# Patient Record
Sex: Female | Born: 1960 | Race: White | Hispanic: No | Smoking: Never smoker
Health system: Southern US, Community
[De-identification: ages and names within clinical notes are randomized; demographics above are authoritative.]

## PROBLEM LIST (undated history)

## (undated) ENCOUNTER — Emergency Department: Payer: Medicare Other

## (undated) DIAGNOSIS — F419 Anxiety disorder, unspecified: Secondary | ICD-10-CM

## (undated) DIAGNOSIS — J42 Unspecified chronic bronchitis: Secondary | ICD-10-CM

## (undated) DIAGNOSIS — M1990 Unspecified osteoarthritis, unspecified site: Secondary | ICD-10-CM

## (undated) DIAGNOSIS — F32A Depression, unspecified: Secondary | ICD-10-CM

## (undated) DIAGNOSIS — K219 Gastro-esophageal reflux disease without esophagitis: Secondary | ICD-10-CM

## (undated) DIAGNOSIS — G43909 Migraine, unspecified, not intractable, without status migrainosus: Secondary | ICD-10-CM

## (undated) DIAGNOSIS — R011 Cardiac murmur, unspecified: Secondary | ICD-10-CM

## (undated) DIAGNOSIS — I10 Essential (primary) hypertension: Secondary | ICD-10-CM

## (undated) DIAGNOSIS — E7800 Pure hypercholesterolemia, unspecified: Secondary | ICD-10-CM

## (undated) DIAGNOSIS — R079 Chest pain, unspecified: Secondary | ICD-10-CM

## (undated) DIAGNOSIS — I639 Cerebral infarction, unspecified: Secondary | ICD-10-CM

## (undated) DIAGNOSIS — F4310 Post-traumatic stress disorder, unspecified: Secondary | ICD-10-CM

## (undated) DIAGNOSIS — I219 Acute myocardial infarction, unspecified: Secondary | ICD-10-CM

## (undated) DIAGNOSIS — F329 Major depressive disorder, single episode, unspecified: Secondary | ICD-10-CM

## (undated) DIAGNOSIS — Q210 Ventricular septal defect: Secondary | ICD-10-CM

## (undated) DIAGNOSIS — D509 Iron deficiency anemia, unspecified: Secondary | ICD-10-CM

## (undated) HISTORY — PX: VSD REPAIR: SHX276

## (undated) HISTORY — PX: CHOLECYSTECTOMY: SHX55

---

## 1997-12-14 ENCOUNTER — Ambulatory Visit (HOSPITAL_COMMUNITY): Admission: RE | Admit: 1997-12-14 | Discharge: 1997-12-14 | Payer: Self-pay | Admitting: Internal Medicine

## 1998-01-03 ENCOUNTER — Other Ambulatory Visit: Admission: RE | Admit: 1998-01-03 | Discharge: 1998-01-03 | Payer: Self-pay | Admitting: Obstetrics and Gynecology

## 1999-06-04 ENCOUNTER — Other Ambulatory Visit: Admission: RE | Admit: 1999-06-04 | Discharge: 1999-06-04 | Payer: Self-pay | Admitting: Gynecology

## 2000-06-12 ENCOUNTER — Other Ambulatory Visit: Admission: RE | Admit: 2000-06-12 | Discharge: 2000-06-12 | Payer: Self-pay | Admitting: Gynecology

## 2000-11-17 ENCOUNTER — Emergency Department (HOSPITAL_COMMUNITY): Admission: EM | Admit: 2000-11-17 | Discharge: 2000-11-18 | Payer: Self-pay | Admitting: Emergency Medicine

## 2000-11-18 ENCOUNTER — Encounter: Payer: Self-pay | Admitting: Emergency Medicine

## 2000-11-24 ENCOUNTER — Ambulatory Visit (HOSPITAL_COMMUNITY): Admission: RE | Admit: 2000-11-24 | Discharge: 2000-11-25 | Payer: Self-pay | Admitting: General Surgery

## 2000-11-24 ENCOUNTER — Encounter: Payer: Self-pay | Admitting: General Surgery

## 2000-11-24 ENCOUNTER — Encounter (INDEPENDENT_AMBULATORY_CARE_PROVIDER_SITE_OTHER): Payer: Self-pay | Admitting: *Deleted

## 2000-11-29 ENCOUNTER — Inpatient Hospital Stay (HOSPITAL_COMMUNITY): Admission: EM | Admit: 2000-11-29 | Discharge: 2000-12-01 | Payer: Self-pay | Admitting: Emergency Medicine

## 2000-11-30 ENCOUNTER — Encounter: Payer: Self-pay | Admitting: Surgery

## 2002-11-10 ENCOUNTER — Emergency Department (HOSPITAL_COMMUNITY): Admission: AD | Admit: 2002-11-10 | Discharge: 2002-11-11 | Payer: Self-pay | Admitting: Emergency Medicine

## 2002-11-11 ENCOUNTER — Encounter: Payer: Self-pay | Admitting: Emergency Medicine

## 2002-12-29 ENCOUNTER — Ambulatory Visit (HOSPITAL_COMMUNITY): Admission: RE | Admit: 2002-12-29 | Discharge: 2002-12-29 | Payer: Self-pay | Admitting: Neurology

## 2003-04-04 ENCOUNTER — Encounter: Admission: RE | Admit: 2003-04-04 | Discharge: 2003-04-04 | Payer: Self-pay | Admitting: Neurology

## 2003-07-15 ENCOUNTER — Other Ambulatory Visit: Admission: RE | Admit: 2003-07-15 | Discharge: 2003-07-15 | Payer: Self-pay | Admitting: Family Medicine

## 2004-11-16 ENCOUNTER — Ambulatory Visit: Payer: Self-pay | Admitting: Family Medicine

## 2004-11-22 ENCOUNTER — Ambulatory Visit: Payer: Self-pay | Admitting: Family Medicine

## 2004-11-27 ENCOUNTER — Ambulatory Visit (HOSPITAL_COMMUNITY): Admission: RE | Admit: 2004-11-27 | Discharge: 2004-11-27 | Payer: Self-pay | Admitting: Neurology

## 2004-12-10 ENCOUNTER — Ambulatory Visit: Payer: Self-pay | Admitting: *Deleted

## 2005-01-03 ENCOUNTER — Ambulatory Visit: Payer: Self-pay | Admitting: Family Medicine

## 2005-01-25 ENCOUNTER — Ambulatory Visit: Payer: Self-pay | Admitting: Family Medicine

## 2005-02-18 ENCOUNTER — Ambulatory Visit: Payer: Self-pay | Admitting: Family Medicine

## 2005-02-18 ENCOUNTER — Ambulatory Visit (HOSPITAL_COMMUNITY): Admission: RE | Admit: 2005-02-18 | Discharge: 2005-02-18 | Payer: Self-pay | Admitting: Family Medicine

## 2005-02-22 ENCOUNTER — Ambulatory Visit: Payer: Self-pay | Admitting: Family Medicine

## 2005-02-27 ENCOUNTER — Ambulatory Visit: Payer: Self-pay | Admitting: Family Medicine

## 2005-03-20 ENCOUNTER — Ambulatory Visit: Payer: Self-pay | Admitting: Family Medicine

## 2005-05-13 ENCOUNTER — Ambulatory Visit: Payer: Self-pay | Admitting: Family Medicine

## 2005-07-12 ENCOUNTER — Ambulatory Visit: Payer: Self-pay | Admitting: Family Medicine

## 2005-08-30 ENCOUNTER — Encounter: Payer: Self-pay | Admitting: Family Medicine

## 2005-08-30 ENCOUNTER — Ambulatory Visit: Payer: Self-pay | Admitting: Family Medicine

## 2005-08-30 ENCOUNTER — Encounter (INDEPENDENT_AMBULATORY_CARE_PROVIDER_SITE_OTHER): Payer: Self-pay | Admitting: Family Medicine

## 2005-08-30 LAB — CONVERTED CEMR LAB: Pap Smear: NORMAL

## 2005-09-16 ENCOUNTER — Ambulatory Visit: Payer: Self-pay | Admitting: Internal Medicine

## 2006-03-05 ENCOUNTER — Ambulatory Visit: Payer: Self-pay | Admitting: Family Medicine

## 2006-03-12 ENCOUNTER — Emergency Department (HOSPITAL_COMMUNITY): Admission: EM | Admit: 2006-03-12 | Discharge: 2006-03-12 | Payer: Self-pay | Admitting: Emergency Medicine

## 2006-03-13 ENCOUNTER — Ambulatory Visit: Payer: Self-pay | Admitting: Family Medicine

## 2006-05-20 ENCOUNTER — Ambulatory Visit: Payer: Self-pay | Admitting: Family Medicine

## 2006-09-06 ENCOUNTER — Encounter (INDEPENDENT_AMBULATORY_CARE_PROVIDER_SITE_OTHER): Payer: Self-pay | Admitting: Family Medicine

## 2006-09-06 DIAGNOSIS — J309 Allergic rhinitis, unspecified: Secondary | ICD-10-CM | POA: Insufficient documentation

## 2006-09-06 DIAGNOSIS — Q210 Ventricular septal defect: Secondary | ICD-10-CM | POA: Insufficient documentation

## 2006-09-06 DIAGNOSIS — R32 Unspecified urinary incontinence: Secondary | ICD-10-CM | POA: Insufficient documentation

## 2006-09-06 DIAGNOSIS — R51 Headache: Secondary | ICD-10-CM | POA: Insufficient documentation

## 2006-09-06 DIAGNOSIS — F329 Major depressive disorder, single episode, unspecified: Secondary | ICD-10-CM

## 2006-09-06 DIAGNOSIS — F411 Generalized anxiety disorder: Secondary | ICD-10-CM | POA: Insufficient documentation

## 2006-09-06 DIAGNOSIS — Z8669 Personal history of other diseases of the nervous system and sense organs: Secondary | ICD-10-CM | POA: Insufficient documentation

## 2006-09-06 DIAGNOSIS — F32A Depression, unspecified: Secondary | ICD-10-CM | POA: Insufficient documentation

## 2006-09-06 DIAGNOSIS — R519 Headache, unspecified: Secondary | ICD-10-CM | POA: Insufficient documentation

## 2006-09-23 ENCOUNTER — Ambulatory Visit: Payer: Self-pay | Admitting: Family Medicine

## 2007-01-21 ENCOUNTER — Encounter (INDEPENDENT_AMBULATORY_CARE_PROVIDER_SITE_OTHER): Payer: Self-pay | Admitting: *Deleted

## 2007-10-19 ENCOUNTER — Telehealth (INDEPENDENT_AMBULATORY_CARE_PROVIDER_SITE_OTHER): Payer: Self-pay | Admitting: Family Medicine

## 2007-11-17 ENCOUNTER — Encounter (INDEPENDENT_AMBULATORY_CARE_PROVIDER_SITE_OTHER): Payer: Self-pay | Admitting: Family Medicine

## 2007-11-17 ENCOUNTER — Ambulatory Visit: Payer: Self-pay | Admitting: Family Medicine

## 2007-11-17 DIAGNOSIS — R569 Unspecified convulsions: Secondary | ICD-10-CM | POA: Insufficient documentation

## 2007-11-19 ENCOUNTER — Ambulatory Visit (HOSPITAL_COMMUNITY): Admission: RE | Admit: 2007-11-19 | Discharge: 2007-11-19 | Payer: Self-pay | Admitting: Family Medicine

## 2007-11-24 ENCOUNTER — Telehealth (INDEPENDENT_AMBULATORY_CARE_PROVIDER_SITE_OTHER): Payer: Self-pay | Admitting: *Deleted

## 2007-11-24 ENCOUNTER — Encounter (INDEPENDENT_AMBULATORY_CARE_PROVIDER_SITE_OTHER): Payer: Self-pay | Admitting: Family Medicine

## 2007-12-14 ENCOUNTER — Ambulatory Visit: Payer: Self-pay | Admitting: Family Medicine

## 2007-12-14 LAB — CONVERTED CEMR LAB
ALT: 19 units/L (ref 0–35)
AST: 25 units/L (ref 0–37)
Albumin: 4.3 g/dL (ref 3.5–5.2)
Basophils Absolute: 0 10*3/uL (ref 0.0–0.1)
CO2: 27 meq/L (ref 19–32)
Calcium: 9.6 mg/dL (ref 8.4–10.5)
Chloride: 102 meq/L (ref 96–112)
Cholesterol: 226 mg/dL — ABNORMAL HIGH (ref 0–200)
FSH: 15.5 milliintl units/mL
Hemoglobin: 11.7 g/dL — ABNORMAL LOW (ref 12.0–15.0)
LH: 21.2 milliintl units/mL
Lymphocytes Relative: 37 % (ref 12–46)
Monocytes Absolute: 0.9 10*3/uL (ref 0.1–1.0)
Neutro Abs: 3.4 10*3/uL (ref 1.7–7.7)
Neutrophils Relative %: 45 % (ref 43–77)
Platelets: 281 10*3/uL (ref 150–400)
Potassium: 4.6 meq/L (ref 3.5–5.3)
RDW: 18 % — ABNORMAL HIGH (ref 11.5–15.5)
Sodium: 139 meq/L (ref 135–145)
TSH: 2.897 microintl units/mL (ref 0.350–4.50)
Total Protein: 7.4 g/dL (ref 6.0–8.3)

## 2008-01-27 ENCOUNTER — Telehealth (INDEPENDENT_AMBULATORY_CARE_PROVIDER_SITE_OTHER): Payer: Self-pay | Admitting: *Deleted

## 2008-02-02 ENCOUNTER — Encounter (INDEPENDENT_AMBULATORY_CARE_PROVIDER_SITE_OTHER): Payer: Self-pay | Admitting: *Deleted

## 2010-02-20 ENCOUNTER — Emergency Department (HOSPITAL_COMMUNITY): Admission: EM | Admit: 2010-02-20 | Discharge: 2010-02-20 | Payer: Self-pay | Admitting: Emergency Medicine

## 2010-07-18 LAB — POCT I-STAT, CHEM 8
BUN: 8 mg/dL (ref 6–23)
Calcium, Ion: 1.25 mmol/L (ref 1.12–1.32)
Chloride: 104 meq/L (ref 96–112)
Creatinine, Ser: 0.7 mg/dL (ref 0.4–1.2)
Glucose, Bld: 101 mg/dL — ABNORMAL HIGH (ref 70–99)
HCT: 38 % (ref 36.0–46.0)
Hemoglobin: 12.9 g/dL (ref 12.0–15.0)
Potassium: 4 meq/L (ref 3.5–5.1)
Sodium: 140 mEq/L (ref 135–145)
TCO2: 29 mmol/L (ref 0–100)

## 2010-07-18 LAB — GLUCOSE, CAPILLARY: Glucose-Capillary: 106 mg/dL — ABNORMAL HIGH (ref 70–99)

## 2010-09-21 NOTE — Procedures (Signed)
CLINICAL HISTORY:  The patient has had episodes of shaking without loss of  consciousness.  Study is being done to look for the presence of seizures.  Medications include Topamax, Prozac and an anxiolytic.   PROCEDURE:  The tracing is carried out on a 32-channel digital Cadwell  recorder reformatted into 16-channel montages with one devoted to EKG. The  patient was awake during the recording. The International 10/20 system lead  placement was used.   DESCRIPTION OF FINDINGS:  The dominant frequency is a 7-8 Hz 30-50 microvolt  activity that is broadly distributed. Mixed frequency lower theta and upper  delta range activity was seen centrally and posteriorly, however, for the  most part was a theta range record with very considerable beta range  activity and muscle artifact. The beta range activity may relate to the  anxiolytic. This gave a sharp contour to the background and multiple sharp  transients were seen, however, no definite sharply contoured slow wave with  a field could be seen in this record.   There was no focal slowing.   EKG showed regular sinus rhythm with ventricular response of 66 beats per  minute. Photic stimulation induced a sustained driving response at multiple  frequencies. Hyperventilation caused little change in background.   IMPRESSION:  Abnormal EEG on the basis of mild diffuse background slowing.  This is a nonspecific indicator of neuronal dysfunction, it may be on a  primary degenerative basis or related to variety of toxic or metabolic  etiologies including medication effect.       NWG:NFAO  D:  11/27/2004 17:30:21  T:  11/27/2004 22:33:13  Job #:  [id number]   cc:   Genene Churn. Love, M.D.  [address]  [address]  [place]  [place] [address]  Fax: [phone]

## 2010-09-21 NOTE — Op Note (Signed)
[place]  Patient:    Stephanie Bray, Stephanie Bray                         MRN: [id number] Proc. Date: 11/24/00 Adm. Date:  [id number] Disc. Date: [id number] Attending:  Chevis Pretty S                           Operative Report  PREOPERATIVE DIAGNOSIS:  Cholecystitis with cholelithiasis.  POSTOPERATIVE DIAGNOSIS:  Cholecystitis with cholelithiasis.  PROCEDURE:  Laparoscopic cholecystectomy.  SURGEON:  Ollen Gross. Vernell Morgans, M.D.  ANESTHESIA:  General endotracheal.  DESCRIPTION OF PROCEDURE:  After informed consent was obtained, the patient was brought to the operating room and placed in the supine position on the operating table.  After adequate induction of general anesthesia, the patients abdomen was prepped with Betadine and draped in the usual sterile manner.  The area just above the umbilicus was infiltrated with 0.25% Marcaine, and a small transverse supraumbilical incision was made with a 15 blade knife.  This incision was carried down through the skin and subcutaneous tissue bluntly using a Kelly clamp and [employer] retractors until the linea alba was identified.  The linea alba was incised with a 15 blade knife, and each side was grasped with Kocher clamps and elevated anteriorly.  The preperitoneal space was then probed bluntly with a hemostat until the peritoneal cavity was entered.  A finger was inserted through this opening, and the anterior abdominal wall was palpated and there did not appear to be any adhesions.  A 0 Vicryl pursestring stitch was placed in the fascia surrounding this opening, and a Hasson cannula was placed through this opening and secured with the previously-placed Vicryl pursestring stitch.  The abdomen was then insufflated with carbon dioxide, and a laparoscope was placed through the Hasson cannula.  At this point it was apparent that the liver was enlarged and that the edge of the liver was roughly at the level of the Hasson  cannula. The abdomen was inspected, and there did not appear to be any adhesions to the anterior abdominal wall inferiorly.  At this point an area was chosen along the midline below the umbilicus for placement of the 10 mm port.  This area was infiltrated with 0.25% Marcaine, and a small vertically-oriented incision was made.  A 10 mm port was then placed bluntly through this opening into the abdominal cavity under direct vision.  The laparoscope was then moved inferiorly to the 10 mm port, and the Hasson cannula would be used as the working port.  Sites were then chosen on the right lateral abdomen for placement of 5 mm ports below the edge of the liver, and these areas were infiltrated with 0.25% Marcaine.  Small stab incisions were made, and 5 mm ports were placed through these incisions bluntly into the abdominal cavity under direct vision.  A blunt grasper was then placed through the lateralmost 5 mm port and used to grasp the dome of the gallbladder and elevate it anterior superiorly.  Another blunt grasper was placed through the other 5 mm port and used to retract on the body and neck of the gallbladder.  A Hasson cannula was placed through the supraumbilical port and using the electrocautery, the peritoneal reflection over top of the gallbladder neck was opened.  Blunt dissection was then carried out in this area until the gallbladder neck-cystic duct junction was identified and dissected  in a circumferential manner.  Care was taken to keep the common duct medial to this dissection.  There were a lot of adhesions surrounding the gallbladder due to the chronic inflammatory process.  Three clips were placed proximally and one distally on the cystic duct, and the duct was divided between the two. Posterior to this, the cystic artery was identified and again dissected bluntly in a circumferential manner.  Three clips had been placed proximally and one distally on the artery, and the  artery was divided between the two. Using the hook cautery, the gallbladder was separated from the liver bed. This was a very dense, tedious dissection, and because of the chronic inflammation, a fair amount of bleeding was encountered.  Prior to completely detaching the gallbladder from the liver bed, the liver bed was inspected and required use of the Bovie electrocautery to obtain hemostasis.  The gallbladder was removed the rest of the way from the liver bed using the electrocautery, and an endoscopic bag was then placed through the Hasson cannula and the gallbladder was placed into the bag and the bag was closed. The gallbladder and the Endobag were then removed with the Hasson cannula through the supraumbilical port.  The Hasson cannula was then replaced and re-anchored.  The liver bed was reinspected.  Because of the significant amount of inflammation, a drain was brought through the ports and brought out the abdominal wall through the lateralmost 5 mm port and was placed in the liver bed where the gallbladder had been.  This drain was then anchored into place with a 3-0 nylon stitch.  The fascia of the supraumbilical port was then closed with the previously-placed Vicryl pursestring stitch.  After removing the Hasson cannula, the rest of the ports were removed under direct vision. Prior to removing the ports, the abdomen was irrigated with copious amounts of saline until the effluent was clear.  The fascia of the lower midline 10 mm port was also closed with a single interrupted 0 Vicryl stitch, and the skin incisions were all closed with interrupted 4-0 Monocryl subcuticular stitches. Benzoin and Steri-Strips and sterile dressings were applied.  Patient tolerated the procedure well.  At the end of the case, all needle, sponge, and instrument counts were correct.  The patient was awakened and taken to the recovery room in stable condition. DD:  11/27/00 TD:  11/28/00 Job:  [id number]

## 2011-02-17 ENCOUNTER — Emergency Department (HOSPITAL_COMMUNITY): Payer: No Typology Code available for payment source

## 2011-02-17 ENCOUNTER — Emergency Department (HOSPITAL_COMMUNITY)
Admission: EM | Admit: 2011-02-17 | Discharge: 2011-02-17 | Disposition: A | Discharge status: Home / Self Care | Payer: No Typology Code available for payment source | Attending: Emergency Medicine | Admitting: Emergency Medicine

## 2011-02-17 DIAGNOSIS — S139XXA Sprain of joints and ligaments of unspecified parts of neck, initial encounter: Secondary | ICD-10-CM | POA: Insufficient documentation

## 2011-02-17 DIAGNOSIS — R071 Chest pain on breathing: Secondary | ICD-10-CM | POA: Insufficient documentation

## 2011-02-17 DIAGNOSIS — S20219A Contusion of unspecified front wall of thorax, initial encounter: Secondary | ICD-10-CM | POA: Insufficient documentation

## 2011-02-17 DIAGNOSIS — M542 Cervicalgia: Secondary | ICD-10-CM | POA: Insufficient documentation

## 2011-02-17 DIAGNOSIS — F341 Dysthymic disorder: Secondary | ICD-10-CM | POA: Insufficient documentation

## 2011-02-17 DIAGNOSIS — Z79899 Other long term (current) drug therapy: Secondary | ICD-10-CM | POA: Insufficient documentation

## 2011-12-09 ENCOUNTER — Emergency Department (HOSPITAL_COMMUNITY): Payer: Medicare Other

## 2011-12-09 ENCOUNTER — Encounter (HOSPITAL_COMMUNITY): Payer: Self-pay | Admitting: Emergency Medicine

## 2011-12-09 ENCOUNTER — Emergency Department (HOSPITAL_COMMUNITY)
Admission: EM | Admit: 2011-12-09 | Discharge: 2011-12-09 | Disposition: A | Discharge status: Home / Self Care | Payer: Medicare Other | Attending: Emergency Medicine | Admitting: Emergency Medicine

## 2011-12-09 DIAGNOSIS — F341 Dysthymic disorder: Secondary | ICD-10-CM | POA: Insufficient documentation

## 2011-12-09 DIAGNOSIS — F411 Generalized anxiety disorder: Secondary | ICD-10-CM | POA: Insufficient documentation

## 2011-12-09 DIAGNOSIS — Z8673 Personal history of transient ischemic attack (TIA), and cerebral infarction without residual deficits: Secondary | ICD-10-CM | POA: Insufficient documentation

## 2011-12-09 DIAGNOSIS — R109 Unspecified abdominal pain: Secondary | ICD-10-CM

## 2011-12-09 DIAGNOSIS — R1031 Right lower quadrant pain: Secondary | ICD-10-CM | POA: Insufficient documentation

## 2011-12-09 HISTORY — DX: Anxiety disorder, unspecified: F41.9

## 2011-12-09 HISTORY — DX: Cerebral infarction, unspecified: I63.9

## 2011-12-09 HISTORY — DX: Depression, unspecified: F32.A

## 2011-12-09 HISTORY — DX: Major depressive disorder, single episode, unspecified: F32.9

## 2011-12-09 LAB — COMPREHENSIVE METABOLIC PANEL
ALT: 16 U/L (ref 0–35)
AST: 24 U/L (ref 0–37)
Albumin: 3.8 g/dL (ref 3.5–5.2)
Alkaline Phosphatase: 73 U/L (ref 39–117)
Calcium: 10.1 mg/dL (ref 8.4–10.5)
GFR calc Af Amer: >90 mL/min (ref >90)
Glucose, Bld: 126 mg/dL — ABNORMAL HIGH (ref 70–99)
Potassium: 3.7 mEq/L (ref 3.5–5.1)
Sodium: 135 mEq/L (ref 135–145)
Total Protein: 7.8 g/dL (ref 6.0–8.3)

## 2011-12-09 LAB — URINALYSIS, ROUTINE W REFLEX MICROSCOPIC
Bilirubin Urine: NEGATIVE
Glucose, UA: NEGATIVE mg/dL
Hgb urine dipstick: NEGATIVE
Specific Gravity, Urine: 1.018 (ref 1.005–1.030)
Urobilinogen, UA: 0.2 mg/dL (ref 0.0–1.0)
pH: 7.5 (ref 5.0–8.0)

## 2011-12-09 LAB — CBC WITH DIFFERENTIAL/PLATELET
Basophils Absolute: 0 10*3/uL (ref 0.0–0.1)
Eosinophils Absolute: 0.2 10*3/uL (ref 0.0–0.7)
Eosinophils Relative: 2 % (ref 0–5)
Lymphs Abs: 2.7 10*3/uL (ref 0.7–4.0)
MCH: 23.3 pg — ABNORMAL LOW (ref 26.0–34.0)
Neutrophils Relative %: 59 % (ref 43–77)
Platelets: 279 10*3/uL (ref 150–400)
RBC: 5.1 MIL/uL (ref 3.87–5.11)
RDW: 16.2 % — ABNORMAL HIGH (ref 11.5–15.5)
WBC: 9.5 10*3/uL (ref 4.0–10.5)

## 2011-12-09 LAB — POCT PREGNANCY, URINE: Preg Test, Ur: NEGATIVE

## 2011-12-09 MED ORDER — PROMETHAZINE HCL 25 MG PO TABS: 25.0000 mg | ORAL_TABLET | Freq: Four times a day (QID) | ORAL | Status: DC | PRN

## 2011-12-09 MED ORDER — IOHEXOL 300 MG/ML  SOLN
100.0000 mL | Freq: Once | INTRAMUSCULAR | Status: AC | PRN
  Administered 2011-12-09: 100 mL via INTRAVENOUS

## 2011-12-09 MED ORDER — OXYCODONE-ACETAMINOPHEN 5-325 MG PO TABS: 1.0000 | ORAL_TABLET | ORAL | Status: AC | PRN

## 2011-12-09 MED ORDER — METOCLOPRAMIDE HCL 5 MG/ML IJ SOLN
10.0000 mg | Freq: Once | INTRAMUSCULAR | Status: AC
  Administered 2011-12-09: 10 mg via INTRAVENOUS
  Filled 2011-12-09: qty 2

## 2011-12-09 MED ORDER — ONDANSETRON HCL 4 MG/2ML IJ SOLN
4.0000 mg | Freq: Once | INTRAMUSCULAR | Status: AC
  Administered 2011-12-09: 4 mg via INTRAVENOUS
  Filled 2011-12-09: qty 2

## 2011-12-09 MED ORDER — SODIUM CHLORIDE 0.9 % IV SOLN
Freq: Once | INTRAVENOUS | Status: AC
  Administered 2011-12-09: 15:00:00 via INTRAVENOUS

## 2011-12-09 MED ORDER — HYDROMORPHONE HCL PF 1 MG/ML IJ SOLN
1.0000 mg | Freq: Once | INTRAMUSCULAR | Status: AC
  Administered 2011-12-09: 1 mg via INTRAVENOUS
  Filled 2011-12-09: qty 1

## 2011-12-09 MED ORDER — DIPHENHYDRAMINE HCL 50 MG/ML IJ SOLN
25.0000 mg | Freq: Once | INTRAMUSCULAR | Status: AC
  Administered 2011-12-09: 25 mg via INTRAVENOUS
  Filled 2011-12-09: qty 1

## 2011-12-09 NOTE — ED Notes (Addendum)
Per EMS, pt c/o dizziness, muscle tremors, bladder pain for several weeks.  Pt recently diagnosed with generalized anxiety disorder by neurologist, who she saw for the muscle tremors and dizziness. Pt's main concern is the pelvic pain.

## 2011-12-09 NOTE — ED Notes (Signed)
Patient transported to Ultrasound 

## 2011-12-09 NOTE — ED Provider Notes (Addendum)
History     CSN: [id number]  Arrival date & time 12/09/11  1345   First MD Initiated Contact with Patient 12/09/11 1449      Chief Complaint  Patient presents with  . Abdominal Pain    (Consider location/radiation/quality/duration/timing/severity/associated sxs/prior treatment) Patient is a 51 y.o. female presenting with abdominal pain. The history is provided by the patient.  Abdominal Pain The primary symptoms of the illness include abdominal pain.  She had onset one week ago of right lower abdominal/suprapubic pain. Pain is sharp and has been getting progressively worse. Pain is severe and she rates it at 10/10. She had been taking ibuprofen which gave slight, temporary relief. It is not affected by body position or movement. There is associated nausea without vomiting. There's been associated urinary urgency and frequency with urge incontinence. There have been no fevers, chills, sweats. She denies any radiation of the pain. She denies constipation or diarrhea. Denies vaginal bleeding. She is about 1 year postmenopausal.  Past Medical History  Diagnosis Date  . Anxiety   . Stroke   . Depression     Past Surgical History  Procedure Date  . Cardiac surgery     History reviewed. No pertinent family history.  History  Substance Use Topics  . Smoking status: Not on file  . Smokeless tobacco: Not on file  . Alcohol Use: No    OB History    Grav Para Term Preterm Abortions TAB SAB Ect Mult Living                  Review of Systems  Gastrointestinal: Positive for abdominal pain.  All other systems reviewed and are negative.    Allergies  Review of patient's allergies indicates no known allergies.  Home Medications  No current outpatient prescriptions on file.  BP 168/98  Pulse 82  Temp 98.9 F (37.2 C)  Resp 20  SpO2 99%  Physical Exam  Nursing note and vitals reviewed. 51 year old female, resting comfortably and in no acute distress. Vital signs are  significant for hypertension, with blood pressure 168/98. Oxygen saturation is 99%, which is normal. Head is normocephalic and atraumatic. PERRLA, EOMI. Oropharynx is clear. Neck is nontender and supple without adenopathy or JVD. Back is nontender and there is no CVA tenderness. Lungs are clear without rales, wheezes, or rhonchi. Chest is nontender. Heart has regular rate and rhythm without murmur. Abdomen is soft, flat, without masses or hepatosplenomegaly and peristalsis is decreased. There is moderate to severe right suprapubic tenderness. There is no definite rebound tenderness or guarding, but there is tenderness to percussion. No other abdominal tenderness is elicited. Extremities have no cyanosis, full range of motion is present. 2+ pitting edema is present. Skin is warm and dry without rash. Neurologic: Mental status is normal, cranial nerves are intact, there are no motor or sensory deficits.   ED Course  Procedures (including critical care time)  Results for orders placed during the hospital encounter of 12/09/11  CBC WITH DIFFERENTIAL      Component Value Range   WBC 9.5  4.0 - 10.5 K/uL   RBC 5.10  3.87 - 5.11 MIL/uL   Hemoglobin 11.9 (*) 12.0 - 15.0 g/dL   HCT 40.9  81.1 - 91.4 %   MCV 72.9 (*) 78.0 - 100.0 fL   MCH 23.3 (*) 26.0 - 34.0 pg   MCHC 32.0  30.0 - 36.0 g/dL   RDW 78.2 (*) 95.6 - 21.3 %   Platelets  279  150 - 400 K/uL   Neutrophils Relative 59  43 - 77 %   Neutro Abs 5.6  1.7 - 7.7 K/uL   Lymphocytes Relative 29  12 - 46 %   Lymphs Abs 2.7  0.7 - 4.0 K/uL   Monocytes Relative 11  3 - 12 %   Monocytes Absolute 1.0  0.1 - 1.0 K/uL   Eosinophils Relative 2  0 - 5 %   Eosinophils Absolute 0.2  0.0 - 0.7 K/uL   Basophils Relative 0  0 - 1 %   Basophils Absolute 0.0  0.0 - 0.1 K/uL  COMPREHENSIVE METABOLIC PANEL      Component Value Range   Sodium 135  135 - 145 mEq/L   Potassium 3.7  3.5 - 5.1 mEq/L   Chloride 99  96 - 112 mEq/L   CO2 27  19 - 32 mEq/L    Glucose, Bld 126 (*) 70 - 99 mg/dL   BUN 12  6 - 23 mg/dL   Creatinine, Ser 2.13  0.50 - 1.10 mg/dL   Calcium 08.6  8.4 - 57.8 mg/dL   Total Protein 7.8  6.0 - 8.3 g/dL   Albumin 3.8  3.5 - 5.2 g/dL   AST 24  0 - 37 U/L   ALT 16  0 - 35 U/L   Alkaline Phosphatase 73  39 - 117 U/L   Total Bilirubin 0.3  0.3 - 1.2 mg/dL   GFR calc non Af Amer >90  >90 mL/min   GFR calc Af Amer >90  >90 mL/min  LIPASE, BLOOD      Component Value Range   Lipase 29  11 - 59 U/L  URINALYSIS, ROUTINE W REFLEX MICROSCOPIC      Component Value Range   Color, Urine YELLOW  YELLOW   APPearance CLEAR  CLEAR   Specific Gravity, Urine 1.018  1.005 - 1.030   pH 7.5  5.0 - 8.0   Glucose, UA NEGATIVE  NEGATIVE mg/dL   Hgb urine dipstick NEGATIVE  NEGATIVE   Bilirubin Urine NEGATIVE  NEGATIVE   Ketones, ur NEGATIVE  NEGATIVE mg/dL   Protein, ur NEGATIVE  NEGATIVE mg/dL   Urobilinogen, UA 0.2  0.0 - 1.0 mg/dL   Nitrite NEGATIVE  NEGATIVE   Leukocytes, UA NEGATIVE  NEGATIVE  POCT PREGNANCY, URINE      Component Value Range   Preg Test, Ur NEGATIVE  NEGATIVE   US Transvaginal Non-ob  12/09/2011  *RADIOLOGY REPORT*  Clinical Data: Pelvic pain.  TRANSABDOMINAL AND TRANSVAGINAL ULTRASOUND OF PELVIS Technique:  Both transabdominal and transvaginal ultrasound examinations of the pelvis were performed. Transabdominal technique was performed for global imaging of the pelvis including uterus, ovaries, adnexal regions, and pelvic cul-de-sac.  It was necessary to proceed with endovaginal exam following the transabdominal exam to visualize the endometrium and adnexa.  Comparison:  CT scan 12/09/2011.  Findings:  Uterus: Measures 10.6 x 3.2 x 6.3 cm. No myometrial abnormalities are demonstrated.  A small amount of fluid is noted in the endocervical canal.  Endometrium: Normal in thickness measuring approximately 8 mm.  Right ovary:  Not visualized. No adnexal mass.  Left ovary: Not visualized. No adnexal mass.  Other findings: No  free fluid  IMPRESSION:  1.  Unremarkable sonographic appearance of the uterus. 2.  Nonvisualization of both ovaries due to body habitus. No adnexal mass or free pelvic fluid collection.  Original Report Authenticated By: P. Loralie Champagne, M.D.   US Pelvis Complete  12/09/2011  *RADIOLOGY REPORT*  Clinical Data: Pelvic pain.  TRANSABDOMINAL AND TRANSVAGINAL ULTRASOUND OF PELVIS Technique:  Both transabdominal and transvaginal ultrasound examinations of the pelvis were performed. Transabdominal technique was performed for global imaging of the pelvis including uterus, ovaries, adnexal regions, and pelvic cul-de-sac.  It was necessary to proceed with endovaginal exam following the transabdominal exam to visualize the endometrium and adnexa.  Comparison:  CT scan 12/09/2011.  Findings:  Uterus: Measures 10.6 x 3.2 x 6.3 cm. No myometrial abnormalities are demonstrated.  A small amount of fluid is noted in the endocervical canal.  Endometrium: Normal in thickness measuring approximately 8 mm.  Right ovary:  Not visualized. No adnexal mass.  Left ovary: Not visualized. No adnexal mass.  Other findings: No free fluid  IMPRESSION:  1.  Unremarkable sonographic appearance of the uterus. 2.  Nonvisualization of both ovaries due to body habitus. No adnexal mass or free pelvic fluid collection.  Original Report Authenticated By: P. Loralie Champagne, M.D.   Ct Abdomen Pelvis W Contrast  12/09/2011  *RADIOLOGY REPORT*  Clinical Data: Right lower quadrant pain.  History of cholecystectomy.  CT ABDOMEN AND PELVIS WITH CONTRAST  Technique:  Multidetector CT imaging of the abdomen and pelvis was performed following the standard protocol during bolus administration of intravenous contrast.  Contrast: OMNIPAQUE IOHEXOL 300 MG/ML  SOLN  Comparison: None.  Findings:   The liver is enlarged, measuring 18.2 cm in cranial caudal length.  No focal abnormalities seen in the liver or spleen. The stomach, duodenum, pancreas, adrenal  glands, and kidneys have normal imaging features.  The gallbladder is surgically absent. Mild prominence of the biliary system may be related to the cholecystectomy.  No abdominal aortic aneurysm.  No free fluid or lymphadenopathy in the abdomen.  Small right paraumbilical hernia contains only fat.  Imaging through the pelvis shows no free intraperitoneal fluid.  No pelvic sidewall lymphadenopathy.  Bladder is unremarkable.  There is some fluid in the endometrial canal and also in the cervical canal which can be physiologic in a premenopausal female.  Left ovary is normal.  2.3 cm dominant follicle is seen in the right ovary.  No colonic diverticulitis.  The terminal ileum is normal.  The appendix is normal.  Bone windows reveal no worrisome lytic or sclerotic osseous lesions.  IMPRESSION: No acute findings in the abdomen or pelvis.  Specifically, no findings to explain the patient's history of right lower quadrant pain.  Fluid in the endometrial cavity.  This can be physiologic in a premenopausal female.  In a post menopausal patient, further evaluation may be indicated and pelvic ultrasound would be the study of choice for initial next characterization.  Original Report Authenticated By: ERIC A. MANSELL, M.D.   Images viewed by me.   1. Abdominal pain   2. ANXIETY       MDM  Way to abdominal/pelvic pain. Patient is one year postmenopausal, but still need to consider possibility of ectopic pregnancy, so pregnancy test will be obtained. CT scan will be obtained to evaluate cause of pain and she will be treated with IV fluids, IV hepatic, and IV antiemetics.  She got good pain relief with hydromorphone. CT scan was unremarkable except for small amount of fluid in the pelvis. Pelvic ultrasound was unremarkable. Etiology of her pain is not clear, but as she is resting comfortably, she will be sent home with prescriptions for Percocet and Phenergan and she is to followup with her PCP in 2  days.  Dione Booze, MD 12/09/11 Derek Jack  Dione Booze, MD 12/09/11 [phone]

## 2011-12-09 NOTE — ED Notes (Signed)
CT notified pt finished with contrast. 

## 2011-12-09 NOTE — ED Notes (Signed)
Pt presenting to ed with c/o positive nausea no vomiting or diarrhea. Pt states eating ok with a normal bowel movement today

## 2012-01-03 ENCOUNTER — Other Ambulatory Visit: Payer: Self-pay | Admitting: Family Medicine

## 2012-01-03 ENCOUNTER — Other Ambulatory Visit (HOSPITAL_COMMUNITY)
Admission: RE | Admit: 2012-01-03 | Discharge: 2012-01-03 | Disposition: A | Discharge status: Home / Self Care | Payer: Medicare Other | Source: Ambulatory Visit | Attending: Family Medicine | Admitting: Family Medicine

## 2012-01-03 DIAGNOSIS — Z1151 Encounter for screening for human papillomavirus (HPV): Secondary | ICD-10-CM | POA: Insufficient documentation

## 2012-01-03 DIAGNOSIS — Z124 Encounter for screening for malignant neoplasm of cervix: Secondary | ICD-10-CM | POA: Insufficient documentation

## 2013-11-23 ENCOUNTER — Encounter: Payer: Self-pay | Admitting: Dietician

## 2013-11-23 ENCOUNTER — Encounter: Payer: Medicare Other | Attending: Family Medicine | Admitting: Dietician

## 2013-11-23 VITALS — Ht <= 58 in | Wt 176.1 lb

## 2013-11-23 DIAGNOSIS — R7309 Other abnormal glucose: Secondary | ICD-10-CM | POA: Diagnosis present

## 2013-11-23 DIAGNOSIS — E669 Obesity, unspecified: Secondary | ICD-10-CM

## 2013-11-23 DIAGNOSIS — Z6836 Body mass index (BMI) 36.0-36.9, adult: Secondary | ICD-10-CM | POA: Diagnosis not present

## 2013-11-23 DIAGNOSIS — Z713 Dietary counseling and surveillance: Secondary | ICD-10-CM | POA: Insufficient documentation

## 2013-11-23 NOTE — Progress Notes (Signed)
  Medical Nutrition Therapy:  Appt start time: 1100 end time:  8916.  Assessment:  Primary concerns today: Stephanie Bray is here today since here blood work showed that she has pre-diabetes. The Hgb A1c was not listed in the referral. She has been told to "watch her sugars" for the last few months.   Stephanie Bray is not working since she has anxiety disorder (on  Buspar) and lives with her 3 sons. States that she does the food shopping and preparation at home. Tries to eat 3 meals per day. States she eats sweets when she is depressed and just lost her mom this month. She does not go out to eat very much.   Has been eating the same way for a long time. Thinks that portion sizes are "a bit bigger than they should be". Tends to be more of a fast eater. Eats meal at her table.   Preferred Learning Style:   No preference indicated   Learning Readiness:   Ready  MEDICATIONS: see list   DIETARY INTAKE:  Usual eating pattern includes 3 meals and 2 snacks per day.  24-hr recall:  B ( AM): cereal (Special K with strawberries) with Lactaid milk and coffee with sweet n' low and milk   Snk ( AM): none  L ( PM): protein, veggie, and a fruit  Snk ( PM): ice cream  D ( PM):  Protein (fish/steak), veggies, and/or spaghetti Snk ( PM): 1 cup milk and Special K cereal  Beverages: water, 1 Diet Coke, and coffee  Usual physical activity: Housework, plans to start walking 30 minutes each day   Estimated energy needs: 1600 calories 180 g carbohydrates 120 g protein 44 g fat  Progress Towards Goal(s):  In progress.   Nutritional Diagnosis:  [place]-3.3 Overweight/obesity As related to large portion sizes and excess intake of sweets.  As evidenced by BMI of 36.8 and elevated blood glucose.    Intervention:  Nutrition counseling provided. Plan: Fill half of your plate with vegetables and have protein the size of the palm of your hand (quarter of the plate). Have starches/carbs fill one quarter of your plate. Think  about using small plates/bowls for meals to help with portion sizes. Have protein with carbohydrates for meals and snacks. Try [facility] Protein Bars or Dannon Light and [employer] for meals/snacks. Try to eat meals slowly, take 20 minutes to eat. Aim for a goal of 30 minutes of activity (walking) most days of the week. If you want to have ice cream, go out to get it (instead of having it at home).   Teaching Method Utilized:  Visual Auditory Hands on  Handouts given during visit include:  Yellow Card  MyPlate Handout  15 g CHO Snacks  Barriers to learning/adherence to lifestyle change: none  Demonstrated degree of understanding via:  Teach Back   Monitoring/Evaluation:  Dietary intake, exercise, and body weight in 2 month(s).

## 2013-11-23 NOTE — Patient Instructions (Signed)
Fill half of your plate with vegetables and have protein the size of the palm of your hand (quarter of the plate). Have starches/carbs fill one quarter of your plate. Think about using small plates/bowls for meals to help with portion sizes. Have protein with carbohydrates for meals and snacks. Try [facility] Protein Bars or Dannon Light and [employer] for meals/snacks. Try to eat meals slowly, take 20 minutes to eat. Aim for a goal of 30 minutes of activity (walking) most days of the week. If you want to have ice cream, go out to get it (instead of having it at home).

## 2014-01-24 ENCOUNTER — Encounter: Payer: Medicare Other | Attending: Family Medicine | Admitting: Dietician

## 2014-01-24 VITALS — Ht <= 58 in | Wt 180.1 lb

## 2014-01-24 DIAGNOSIS — R7309 Other abnormal glucose: Secondary | ICD-10-CM | POA: Diagnosis present

## 2014-01-24 DIAGNOSIS — Z713 Dietary counseling and surveillance: Secondary | ICD-10-CM | POA: Diagnosis present

## 2014-01-24 DIAGNOSIS — Z6836 Body mass index (BMI) 36.0-36.9, adult: Secondary | ICD-10-CM | POA: Diagnosis not present

## 2014-01-24 DIAGNOSIS — E669 Obesity, unspecified: Secondary | ICD-10-CM | POA: Diagnosis not present

## 2014-01-24 NOTE — Progress Notes (Signed)
  Medical Nutrition Therapy:  Appt start time: 1130 end time:  1200.  Assessment:  Primary concerns today: Stephanie Bray is here today since here blood work showed that she has pre-diabetes. Returns with a 4 lb weight gain. Thought blood sugar might be low and we tested it and it was 288 mg/dl. Recommended that she make an appointment with her doctor. Felt better by the end of the appointment and she relaxed and drank some water.    Has been trying to eat more fruits and vegetables and watching her sugar intake. Had a small dessert last week for her birthday but overall not having as many sweets. Has not started walking yet.   States that her stress level has been high since she is in the process of moving.   Preferred Learning Style:   No preference indicated   Learning Readiness:   Ready  MEDICATIONS: see list   DIETARY INTAKE:  Usual eating pattern includes 3 meals and 2 snacks per day.  24-hr recall:  B ( AM): cereal (Special K with strawberries) with Lactaid milk and coffee with sweet n' low and milk   Snk ( AM): none  L ( PM): protein, veggie, and a fruit  Snk ( PM): ice cream  D ( PM):  Protein (fish/steak), veggies, and/or spaghetti Snk ( PM): 1 cup milk and Special K cereal  Beverages: water, 1 Diet Coke, and coffee  Usual physical activity: Housework, plans to start walking 30 minutes each day   Estimated energy needs: 1600 calories 180 g carbohydrates 120 g protein 44 g fat  Progress Towards Goal(s):  In progress.   Nutritional Diagnosis:  [place]-3.3 Overweight/obesity As related to large portion sizes and excess intake of sweets.  As evidenced by BMI of 36.8 and elevated blood glucose.    Intervention:  Nutrition counseling provided. Plan: Fill half of your plate with vegetables and have protein the size of the palm of your hand (quarter of the plate). Have starches/carbs fill one quarter of your plate. Think about using small plates/bowls for meals to help with  portion sizes. Have protein with carbohydrates for meals and snacks. Try [facility] Protein Bars or Dannon Light and [employer] for meals/snacks. Try to eat meals slowly, take 20 minutes to eat. Aim for a goal of 30 minutes of activity (walking) most days of the week. If you want to have ice cream, go out to get it (instead of having it at home).   Teaching Method Utilized:  Visual Auditory Hands on  Handouts given during visit include:  Protein Shakes  Barriers to learning/adherence to lifestyle change: none  Demonstrated degree of understanding via:  Teach Back   Monitoring/Evaluation:  Dietary intake, exercise, and body weight in 2 month(s).

## 2014-01-24 NOTE — Patient Instructions (Addendum)
Fill half of your plate with vegetables and have protein the size of the palm of your hand (quarter of the plate). Have starches/carbs fill one quarter of your plate. Think about using small plates/bowls for meals to help with portion sizes. Have protein with carbohydrates for meals and snacks. Try to eat meals slowly, take 20 minutes to eat. Aim for a goal of 30 minutes of activity (walking) most days of the week. If you want to have a protein shake choose one that is 15 grams or more of protein and less than 5 g of carbohydrates. Contact your doctor to see about an earlier appointment since blood sugar 288 mg/dl during appointment today.  Read and do some artwork to help with stress.

## 2014-03-28 ENCOUNTER — Encounter: Payer: Medicare Other | Attending: Family Medicine | Admitting: Dietician

## 2014-03-28 VITALS — Ht <= 58 in | Wt 174.9 lb

## 2014-03-28 DIAGNOSIS — E669 Obesity, unspecified: Secondary | ICD-10-CM | POA: Insufficient documentation

## 2014-03-28 DIAGNOSIS — Z713 Dietary counseling and surveillance: Secondary | ICD-10-CM | POA: Insufficient documentation

## 2014-03-28 DIAGNOSIS — Z6836 Body mass index (BMI) 36.0-36.9, adult: Secondary | ICD-10-CM | POA: Diagnosis not present

## 2014-03-28 NOTE — Patient Instructions (Addendum)
Fill half of your plate with vegetables and have protein the size of the palm of your hand (quarter of the plate). Have starches/carbs fill one quarter of your plate. Have protein with carbohydrates for meals and snacks. Aim for a goal of 30 minutes of activity (stairs, weights, treadmill, bike peddler) most days of the week. Make sure soy milk is unsweetened. Try a [employer] Protein bar for a snack or breakfast. Add a protein (eggs, cottage cheese, or Premier Protein Shake) on the side to have with oatmeal.

## 2014-03-28 NOTE — Progress Notes (Signed)
  Medical Nutrition Therapy:  Appt start time: 1010 end time:  4097.  Assessment:  Primary concerns today: Stephanie Bray is here today for a follow up for obesity and Pre-Diabetes. Had an appointment on 03/08/2014 with a new doctor at [facility]. Fasting blood sugar was 117 mg/dl.  Did not add any medication for blood sugar and is not testing at this time. Has a follow up in 3 month. Did add simvastatin and blood pressure medication (she does not remember the name).  Has moved into her house and her stress level is going down. Has lost 6 lbs since last visit. Not eating as much as before, reading at night to calm down, though not sleeping as good lately since "mind is racing".   Wt Readings from Last 3 Encounters:  03/28/14 174 lb 14.4 oz (79.334 kg)  01/24/14 180 lb 1.6 oz (81.693 kg)  11/23/13 176 lb 1.6 oz (79.878 kg)   Ht Readings from Last 3 Encounters:  03/28/14 4\' 10"  (1.473 m)  01/24/14 4\' 10"  (1.473 m)  11/23/13 4\' 10"  (1.473 m)   Body mass index is 36.56 kg/(m^2). @BMIFA @ Normalized weight-for-age data available only for age 47 to 50 years. Normalized stature-for-age data available only for age 47 to 35 years.   Preferred Learning Style:   No preference indicated   Learning Readiness:   Ready  MEDICATIONS: see list   DIETARY INTAKE:  Usual eating pattern includes 3 meals and 2 snacks per day.  24-hr recall:  B ( AM): plain oatmeal and coffee with sweet n' low and milk   Snk ( AM): none  L ( PM): protein, veggie, and a fruit  Snk ( PM): none D ( PM):  Protein (fish/steak), veggies, and/or spaghetti Snk ( PM): might have peanuts and diet coke Beverages: water, 1 Diet Coke every once in awhile, and coffee  Usual physical activity: Housework, walking the basement stairs throughout the day  Estimated energy needs: 1600 calories 180 g carbohydrates 120 g protein 44 g fat  Progress Towards Goal(s):  In progress.   Nutritional Diagnosis:  [place]-3.3 Overweight/obesity As  related to large portion sizes and excess intake of sweets.  As evidenced by BMI of 36.8 and elevated blood glucose.    Intervention:  Nutrition counseling provided. Plan: Fill half of your plate with vegetables and have protein the size of the palm of your hand (quarter of the plate). Have starches/carbs fill one quarter of your plate. Have protein with carbohydrates for meals and snacks. Aim for a goal of 30 minutes of activity (stairs, weights, treadmill, bike peddler) most days of the week. Make sure soy milk is unsweetened. Try a [employer] Protein bar for a snack or breakfast. Add a protein (eggs, cottage cheese, or Premier Protein Shake) on the side to have with oatmeal.   Teaching Method Utilized:  Visual Auditory Hands on  Handouts given during visit include:  Protein Shakes  Barriers to learning/adherence to lifestyle change: none  Demonstrated degree of understanding via:  Teach Back   Monitoring/Evaluation:  Dietary intake, exercise, and body weight in 2 month(s).

## 2014-04-25 ENCOUNTER — Encounter (HOSPITAL_COMMUNITY): Payer: Self-pay | Admitting: Physical Medicine and Rehabilitation

## 2014-04-25 ENCOUNTER — Emergency Department (HOSPITAL_COMMUNITY): Payer: Medicare Other

## 2014-04-25 ENCOUNTER — Observation Stay (HOSPITAL_COMMUNITY)
Admission: EM | Admit: 2014-04-25 | Discharge: 2014-04-26 | Disposition: A | Discharge status: Home / Self Care | Payer: Medicare Other | Attending: Family Medicine | Admitting: Family Medicine

## 2014-04-25 DIAGNOSIS — R079 Chest pain, unspecified: Principal | ICD-10-CM | POA: Diagnosis present

## 2014-04-25 DIAGNOSIS — R110 Nausea: Secondary | ICD-10-CM | POA: Diagnosis not present

## 2014-04-25 DIAGNOSIS — J45909 Unspecified asthma, uncomplicated: Secondary | ICD-10-CM | POA: Diagnosis not present

## 2014-04-25 DIAGNOSIS — R61 Generalized hyperhidrosis: Secondary | ICD-10-CM | POA: Diagnosis not present

## 2014-04-25 DIAGNOSIS — R011 Cardiac murmur, unspecified: Secondary | ICD-10-CM | POA: Insufficient documentation

## 2014-04-25 DIAGNOSIS — Z8673 Personal history of transient ischemic attack (TIA), and cerebral infarction without residual deficits: Secondary | ICD-10-CM | POA: Insufficient documentation

## 2014-04-25 DIAGNOSIS — R0602 Shortness of breath: Secondary | ICD-10-CM | POA: Diagnosis present

## 2014-04-25 DIAGNOSIS — F419 Anxiety disorder, unspecified: Secondary | ICD-10-CM | POA: Diagnosis not present

## 2014-04-25 DIAGNOSIS — F329 Major depressive disorder, single episode, unspecified: Secondary | ICD-10-CM | POA: Insufficient documentation

## 2014-04-25 DIAGNOSIS — R001 Bradycardia, unspecified: Secondary | ICD-10-CM

## 2014-04-25 DIAGNOSIS — F32A Depression, unspecified: Secondary | ICD-10-CM | POA: Diagnosis present

## 2014-04-25 DIAGNOSIS — F411 Generalized anxiety disorder: Secondary | ICD-10-CM | POA: Diagnosis present

## 2014-04-25 DIAGNOSIS — Z79899 Other long term (current) drug therapy: Secondary | ICD-10-CM | POA: Diagnosis not present

## 2014-04-25 HISTORY — DX: Cardiac murmur, unspecified: R01.1

## 2014-04-25 HISTORY — DX: Gastro-esophageal reflux disease without esophagitis: K21.9

## 2014-04-25 HISTORY — DX: Unspecified osteoarthritis, unspecified site: M19.90

## 2014-04-25 HISTORY — DX: Migraine, unspecified, not intractable, without status migrainosus: G43.909

## 2014-04-25 HISTORY — DX: Unspecified chronic bronchitis: J42

## 2014-04-25 HISTORY — DX: Iron deficiency anemia, unspecified: D50.9

## 2014-04-25 HISTORY — DX: Pure hypercholesterolemia, unspecified: E78.00

## 2014-04-25 LAB — COMPREHENSIVE METABOLIC PANEL
ALT: 14 U/L (ref 0–35)
AST: 19 U/L (ref 0–37)
Albumin: 3.5 g/dL (ref 3.5–5.2)
Alkaline Phosphatase: 55 U/L (ref 39–117)
Anion gap: 10 (ref 5–15)
BUN: 16 mg/dL (ref 6–23)
CALCIUM: 9.3 mg/dL (ref 8.4–10.5)
CO2: 26 mEq/L (ref 19–32)
CREATININE: 0.7 mg/dL (ref 0.50–1.10)
Chloride: 105 mEq/L (ref 96–112)
GFR calc non Af Amer: >90 mL/min (ref >90)
GLUCOSE: 101 mg/dL — AB (ref 70–99)
Potassium: 4 mEq/L (ref 3.7–5.3)
SODIUM: 141 meq/L (ref 137–147)
TOTAL PROTEIN: 7.1 g/dL (ref 6.0–8.3)
Total Bilirubin: <0.2 mg/dL — ABNORMAL LOW (ref 0.3–1.2)

## 2014-04-25 LAB — CBC WITH DIFFERENTIAL/PLATELET
BASOS ABS: 0 10*3/uL (ref 0.0–0.1)
Basophils Relative: 0 % (ref 0–1)
EOS ABS: 0.2 10*3/uL (ref 0.0–0.7)
EOS PCT: 3 % (ref 0–5)
HCT: 36.2 % (ref 36.0–46.0)
Hemoglobin: 11 g/dL — ABNORMAL LOW (ref 12.0–15.0)
Lymphocytes Relative: 34 % (ref 12–46)
Lymphs Abs: 2.6 10*3/uL (ref 0.7–4.0)
MCH: 22.5 pg — AB (ref 26.0–34.0)
MCHC: 30.4 g/dL (ref 30.0–36.0)
MCV: 74.2 fL — AB (ref 78.0–100.0)
MONO ABS: 0.8 10*3/uL (ref 0.1–1.0)
Monocytes Relative: 10 % (ref 3–12)
Neutro Abs: 4 10*3/uL (ref 1.7–7.7)
Neutrophils Relative %: 53 % (ref 43–77)
PLATELETS: 203 10*3/uL (ref 150–400)
RBC: 4.88 MIL/uL (ref 3.87–5.11)
RDW: 15.9 % — AB (ref 11.5–15.5)
WBC: 7.6 10*3/uL (ref 4.0–10.5)

## 2014-04-25 LAB — PRO B NATRIURETIC PEPTIDE: PRO B NATRI PEPTIDE: 628.2 pg/mL — AB (ref 0–125)

## 2014-04-25 LAB — GLUCOSE, CAPILLARY: Glucose-Capillary: 105 mg/dL — ABNORMAL HIGH (ref 70–99)

## 2014-04-25 LAB — TROPONIN I: Troponin I: <0.3 ng/mL (ref <0.30)

## 2014-04-25 LAB — D-DIMER, QUANTITATIVE: D-Dimer, Quant: 0.36 ug/mL-FEU (ref 0.00–0.48)

## 2014-04-25 LAB — POC URINE PREG, ED: PREG TEST UR: NEGATIVE

## 2014-04-25 MED ORDER — MORPHINE SULFATE 2 MG/ML IJ SOLN
2.0000 mg | INTRAMUSCULAR | Status: DC | PRN
  Administered 2014-04-25: 2 mg via INTRAVENOUS
  Filled 2014-04-25 (×3): qty 1

## 2014-04-25 MED ORDER — MORPHINE SULFATE 2 MG/ML IJ SOLN
2.0000 mg | Freq: Once | INTRAMUSCULAR | Status: DC
Start: 2014-04-25 — End: 2014-04-25

## 2014-04-25 MED ORDER — METOPROLOL TARTRATE 12.5 MG HALF TABLET
12.5000 mg | ORAL_TABLET | Freq: Two times a day (BID) | ORAL | Status: DC
  Filled 2014-04-25 (×3): qty 1

## 2014-04-25 MED ORDER — SODIUM CHLORIDE 0.9 % IV BOLUS (SEPSIS)
500.0000 mL | Freq: Once | INTRAVENOUS | Status: AC
  Administered 2014-04-25: 500 mL via INTRAVENOUS

## 2014-04-25 MED ORDER — TRAZODONE HCL 100 MG PO TABS
300.0000 mg | ORAL_TABLET | Freq: Every day | ORAL | Status: DC
  Administered 2014-04-25: 300 mg via ORAL
  Filled 2014-04-25: qty 3

## 2014-04-25 MED ORDER — HEPARIN SODIUM (PORCINE) 5000 UNIT/ML IJ SOLN
5000.0000 [IU] | Freq: Three times a day (TID) | INTRAMUSCULAR | Status: DC
  Administered 2014-04-25 – 2014-04-26 (×2): 5000 [IU] via SUBCUTANEOUS
  Filled 2014-04-25 (×2): qty 1

## 2014-04-25 MED ORDER — PANTOPRAZOLE SODIUM 40 MG PO TBEC
80.0000 mg | DELAYED_RELEASE_TABLET | Freq: Every day | ORAL | Status: DC
  Administered 2014-04-26: 80 mg via ORAL
  Filled 2014-04-25 (×2): qty 2

## 2014-04-25 MED ORDER — GI COCKTAIL ~~LOC~~
30.0000 mL | Freq: Four times a day (QID) | ORAL | Status: DC | PRN
  Administered 2014-04-26: 30 mL via ORAL
  Filled 2014-04-25: qty 30

## 2014-04-25 MED ORDER — ONDANSETRON HCL 4 MG/2ML IJ SOLN
4.0000 mg | Freq: Four times a day (QID) | INTRAMUSCULAR | Status: DC | PRN
  Administered 2014-04-26: 4 mg via INTRAVENOUS

## 2014-04-25 MED ORDER — BUSPIRONE HCL 10 MG PO TABS
10.0000 mg | ORAL_TABLET | Freq: Three times a day (TID) | ORAL | Status: DC
  Administered 2014-04-25 – 2014-04-26 (×2): 10 mg via ORAL
  Filled 2014-04-25 (×3): qty 1

## 2014-04-25 MED ORDER — SIMVASTATIN 10 MG PO TABS
10.0000 mg | ORAL_TABLET | Freq: Every day | ORAL | Status: DC
  Administered 2014-04-25: 10 mg via ORAL
  Filled 2014-04-25: qty 1

## 2014-04-25 MED ORDER — FLUOXETINE HCL 20 MG PO TABS
20.0000 mg | ORAL_TABLET | Freq: Every day | ORAL | Status: DC
  Administered 2014-04-26: 20 mg via ORAL
  Filled 2014-04-25 (×3): qty 1

## 2014-04-25 MED ORDER — SIMVASTATIN 10 MG PO TABS: 10.0000 mg | ORAL_TABLET | Freq: Every day | ORAL | Status: DC

## 2014-04-25 MED ORDER — NITROGLYCERIN 0.4 MG SL SUBL
0.4000 mg | SUBLINGUAL_TABLET | SUBLINGUAL | Status: DC | PRN
  Administered 2014-04-25 (×2): 0.4 mg via SUBLINGUAL
  Filled 2014-04-25: qty 1

## 2014-04-25 MED ORDER — ACETAMINOPHEN 325 MG PO TABS
650.0000 mg | ORAL_TABLET | ORAL | Status: DC | PRN
  Administered 2014-04-26: 650 mg via ORAL
  Filled 2014-04-25: qty 2

## 2014-04-25 NOTE — H&P (Signed)
[facility] Admission History and Physical Service Pager: [phone]  Patient name: Stephanie Bray Medical record number: [id number] Date of birth: [birth year] Age: 53 y.o. Gender: female  Primary Care Provider: Leamon Arnt, MD Consultants: none Code Status: Full   Chief Complaint: Chest pain   Assessment and Plan: Stephanie Bray is a 53 y.o. female presenting with chest pain. PMH is significant for Anxiety, depression,   #chest pain: story suggestive underlying process but HEART score of 3-4. Patient with several stressors in her life recently of losing her car, her mother and her house with baseline of anxiety and also reflux. Pre-diabetes and not currently on medication. No family history of heart diease.  D-Dimer neg. Pain reproducible with palpation and trop neg.  Symptoms most likely 2/2 anxiety or reflux.  - admitted to telemetry, Dr. Gwendlyn Deutscher attending  - Cycle troponins  - EKG AM  - TSH, Lipid panel, Hgb A1c,  - 2D ECHO  - spoke with cardiology and will see tomorrow.   #Bradycardia: noticed on EKG. No prior EKG's to compare. Asymptomatic.  - metoprolol decreased from 25 mg BID to 12.5 mg BID   #Elevated BNP: no symptoms of orthopnea, dry cough or PND. Mild pitting edema.  CXR showing no overload.   - ECHO as above    #Hx of ventral septal defect repair at [facility]: Surgery was performed at 43 months of age.  reports that two stitches have "popped" out and hasn't been fixed. She can find travel to [place].  #Anxiety and depression: Felt more anxious earlier today.  - Continue buspar, prozac, trazodone   FEN/GI: heart healthy diet/ SLIV  Prophylaxis: SubQ hep   Disposition: admitted to telemetry for ACS r/o.   History of Present Illness: Stephanie Bray is a 53 y.o. female presenting with chest pain that has been present for the past week. URI symptoms for the past week. Having dyspnea that borough her in today. Chest pain has been present all day today.  Described as tight substernal and 8/10. Left arm has been numb today. Having nausea but no vomiting, diaphoresis, and no constipation, diarrhea, or fever, chills. Nothing has helped the pain. It is unrelated to activity. Nothing makes it worse. Never had pain like this before. Worse when she sits upright. No history of reflux or trauma to the chest.   Shortness of breath is better now than before. This was resolved on its own.   Review Of Systems: Per HPI with the following additions: Nausea.  Otherwise 12 point review of systems was performed and was unremarkable.  Patient Active Problem List   Diagnosis Date Noted  . Chest pain 04/25/2014  . SEIZURE DISORDER 11/17/2007  . ANXIETY 09/06/2006  . DEPRESSION 09/06/2006  . ALLERGIC RHINITIS 09/06/2006  . 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 HEADACHE 09/06/2006  . SYMPTOM, INCONTINENCE, URINARY NOS 09/06/2006  . SEIZURES, HX OF 09/06/2006   Past Medical History: Past Medical History  Diagnosis Date  . Anxiety   . Stroke   . Depression   . Asthma    Past Surgical History: Past Surgical History  Procedure Laterality Date  . Cardiac surgery     Social History: History  Substance Use Topics  . Smoking status: Never Smoker   . Smokeless tobacco: Not on file  . Alcohol Use: No   Additional social history: no tobacco, EtOH, illicit drugs.   Please also refer to relevant sections of EMR.  Family History: No family history  on file. Allergies and Medications: No Known Allergies No current facility-administered medications on file prior to encounter.   Current Outpatient Prescriptions on File Prior to Encounter  Medication Sig Dispense Refill  . busPIRone (BUSPAR) 10 MG tablet Take 10 mg by mouth 3 (three) times daily.    . calcium carbonate (OS-CAL) 600 MG TABS Take 600 mg by mouth daily.    . Ferrous Sulfate (IRON) 325 (65 FE) MG TABS Take by mouth.    . fish oil-omega-3 fatty acids 1000 MG capsule Take 1 g by  mouth daily.    Marland Kitchen FLUoxetine (PROZAC) 20 MG tablet Take 20 mg by mouth daily.    . Multiple Vitamin (MULTIVITAMIN WITH MINERALS) TABS Take 1 tablet by mouth daily.    Marland Kitchen omeprazole (PRILOSEC) 20 MG capsule Take 20 mg by mouth daily.    . simvastatin (ZOCOR) 10 MG tablet Take 10 mg by mouth daily.    . traZODone (DESYREL) 100 MG tablet Take 300 mg by mouth at bedtime.    . promethazine (PHENERGAN) 25 MG tablet Take 1 tablet (25 mg total) by mouth every 6 (six) hours as needed for nausea. 30 tablet 0    Objective: BP 131/63 mmHg  Pulse 56  Temp(Src) 98.7 F (37.1 C) (Oral)  Resp 26  SpO2 98% Exam: General: Well appearing, NAD, cooperative with exam  HEENT: EOMI, PERRL, tacky MM,  Cardiovascular: bradycardic, Regular rhythm, III/VI systolic murmur,  Respiratory: CTAB, no crackles or wheezes  Abdomen: soft, NTND, +BS, no HSm  Extremities: CR brisk, mild pitting edema,  Skin: no rashes  Neuro: alert and oriented.  Labs and Imaging: CBC BMET   Recent Labs Lab 04/25/14 1643  WBC 7.6  HGB 11.0*  HCT 36.2  PLT 203    Recent Labs Lab 04/25/14 1643  NA 141  K 4.0  CL 105  CO2 26  BUN 16  CREATININE 0.70  GLUCOSE 101*  CALCIUM 9.3     D-dimer: normal   EKG: LBBB, RBBB, bradycardia   Rosemarie Ax, MD 04/25/2014, 7:35 PM PGY-2, [facility] pager: [phone], text pages welcome

## 2014-04-25 NOTE — ED Notes (Signed)
Transporting patient to new room assignment. 

## 2014-04-25 NOTE — ED Notes (Addendum)
Pt presents to department via [facility] from home for evaluation of cough, SOB, L sided chest pain radiating to L arm. Ongoing x1 week. Per EMS pt hyperventilating and making loud noises with breathing upon their arrival to home. Pt is alert and oriented x4. 22g L hand.

## 2014-04-25 NOTE — ED Provider Notes (Signed)
CSN: [id number]     Arrival date & time 04/25/14  1556 History   First MD Initiated Contact with Patient 04/25/14 1557     Chief Complaint  Patient presents with  . Shortness of Breath  . Cough  . Chest Pain  . Anxiety     (Consider location/radiation/quality/duration/timing/severity/associated sxs/prior Treatment) The history is provided by the patient. No language interpreter was used.  Stephanie Bray is a 53 year old female with past medical history of anxiety, stroke, depression, asthma presenting to the ED with chest pain, shortness of breath that has been ongoing for approximately one week. Patient reported that chest pain started about a week ago localized left-sided the chest described as a squeezing, pressure sensation that is intermittent lasting a couple of minutes with radiation down the left arm. Stated that when she experiences the chest pain she becomes diaphoretic and short of breath. Stated that she was walking in the mall the other day and became rather short of breath with only a few steps. Stated that when she has these episodes of chest pain and shortness of breath she does become nauseated-denied emesis. Reported that she's been having nasal congestion and dry cough. Reported that she's been using Mucinex thinking that this is a bronchitis. Reported that she's been having intermittent dizziness associated with the chest pain. Patient was brought in via EMS today-aspirin 324 was administered to patient en route to the hospital. Denied blurred vision, sudden loss of vision, sore throat, difficulty swallowing, fainting, headache, abdominal pain, changes to bowel movements or urinary issues, neck pain, neck stiffness, back pain, vomiting, diarrhea, melena, hematochezia, fever, chills. PCP Dr. Jonni Sanger  Past Medical History  Diagnosis Date  . Anxiety   . Stroke   . Depression   . Asthma    Past Surgical History  Procedure Laterality Date  . Cardiac surgery     No family  history on file. History  Substance Use Topics  . Smoking status: Never Smoker   . Smokeless tobacco: Not on file  . Alcohol Use: No   OB History    No data available     Review of Systems  Constitutional: Positive for diaphoresis. Negative for fever, chills and fatigue.  Respiratory: Positive for cough and shortness of breath. Negative for chest tightness.   Cardiovascular: Positive for chest pain.  Gastrointestinal: Positive for nausea. Negative for vomiting and abdominal pain.  Musculoskeletal: Negative for back pain, neck pain and neck stiffness.  Neurological: Positive for dizziness. Negative for weakness, light-headedness and headaches.      Allergies  Review of patient's allergies indicates no known allergies.  Home Medications   Prior to Admission medications   Medication Sig Start Date End Date Taking? Authorizing Provider  busPIRone (BUSPAR) 10 MG tablet Take 10 mg by mouth 3 (three) times daily.   Yes Historical Provider, MD  calcium carbonate (OS-CAL) 600 MG TABS Take 600 mg by mouth daily.   Yes Historical Provider, MD  Ferrous Sulfate (IRON) 325 (65 FE) MG TABS Take by mouth.   Yes Historical Provider, MD  fish oil-omega-3 fatty acids 1000 MG capsule Take 1 g by mouth daily.   Yes Historical Provider, MD  FLUoxetine (PROZAC) 20 MG tablet Take 20 mg by mouth daily.   Yes Historical Provider, MD  metoprolol tartrate (LOPRESSOR) 25 MG tablet Take 25 mg by mouth 2 (two) times daily.   Yes Historical Provider, MD  Multiple Vitamin (MULTIVITAMIN WITH MINERALS) TABS Take 1 tablet by mouth daily.  Yes Historical Provider, MD  omeprazole (PRILOSEC) 20 MG capsule Take 20 mg by mouth daily.   Yes Historical Provider, MD  simvastatin (ZOCOR) 10 MG tablet Take 10 mg by mouth daily.   Yes Historical Provider, MD  traZODone (DESYREL) 100 MG tablet Take 300 mg by mouth at bedtime.   Yes Historical Provider, MD  promethazine (PHENERGAN) 25 MG tablet Take 1 tablet (25 mg total) by  mouth every 6 (six) hours as needed for nausea. 12/09/11 6/76/19  Delora Fuel, MD   BP 509/32 mmHg  Pulse 56  Temp(Src) 98.7 F (37.1 C) (Oral)  Resp 26  SpO2 98% Physical Exam  Constitutional: She is oriented to person, place, and time. She appears well-developed and well-nourished. No distress.  HENT:  Head: Normocephalic and atraumatic.  Eyes: Conjunctivae and EOM are normal. Pupils are equal, round, and reactive to light. Right eye exhibits no discharge. Left eye exhibits no discharge.  Neck: Normal range of motion. Neck supple. No tracheal deviation present.  Cardiovascular: Normal rate and regular rhythm.  Exam reveals no friction rub.   Murmur heard. Pulses:      Radial pulses are 2+ on the right side, and 2+ on the left side.       Dorsalis pedis pulses are 2+ on the right side, and 2+ on the left side.  Cap refill < 3 seconds Negative swelling or pitting edema noted to the lower extremities bilaterally   Pulmonary/Chest: Effort normal and breath sounds normal. No respiratory distress. She has no wheezes. She has no rales. She exhibits no tenderness.  Patient is able to speak in full sentences without difficulty Negative use of accessory muscles Negative stridor Negative pain upon palpation to the chest wall-negative crepitus  Musculoskeletal: Normal range of motion.  Full ROM to upper and lower extremities without difficulty noted, negative ataxia noted.  Lymphadenopathy:    She has no cervical adenopathy.  Neurological: She is alert and oriented to person, place, and time. No cranial nerve deficit. She exhibits normal muscle tone. Coordination normal.  Skin: Skin is warm and dry. No rash noted. She is not diaphoretic. No erythema.  Psychiatric: She has a normal mood and affect. Her behavior is normal. Thought content normal.  Nursing note and vitals reviewed.   ED Course  Procedures (including critical care time)  Results for orders placed or performed during the  hospital encounter of 04/25/14  CBC with Differential  Result Value Ref Range   WBC 7.6 4.0 - 10.5 K/uL   RBC 4.88 3.87 - 5.11 MIL/uL   Hemoglobin 11.0 (L) 12.0 - 15.0 g/dL   HCT 36.2 36.0 - 46.0 %   MCV 74.2 (L) 78.0 - 100.0 fL   MCH 22.5 (L) 26.0 - 34.0 pg   MCHC 30.4 30.0 - 36.0 g/dL   RDW 15.9 (H) 11.5 - 15.5 %   Platelets 203 150 - 400 K/uL   Neutrophils Relative % 53 43 - 77 %   Neutro Abs 4.0 1.7 - 7.7 K/uL   Lymphocytes Relative 34 12 - 46 %   Lymphs Abs 2.6 0.7 - 4.0 K/uL   Monocytes Relative 10 3 - 12 %   Monocytes Absolute 0.8 0.1 - 1.0 K/uL   Eosinophils Relative 3 0 - 5 %   Eosinophils Absolute 0.2 0.0 - 0.7 K/uL   Basophils Relative 0 0 - 1 %   Basophils Absolute 0.0 0.0 - 0.1 K/uL  Comprehensive metabolic panel  Result Value Ref Range  Sodium 141 137 - 147 mEq/L   Potassium 4.0 3.7 - 5.3 mEq/L   Chloride 105 96 - 112 mEq/L   CO2 26 19 - 32 mEq/L   Glucose, Bld 101 (H) 70 - 99 mg/dL   BUN 16 6 - 23 mg/dL   Creatinine, Ser 0.70 0.50 - 1.10 mg/dL   Calcium 9.3 8.4 - 10.5 mg/dL   Total Protein 7.1 6.0 - 8.3 g/dL   Albumin 3.5 3.5 - 5.2 g/dL   AST 19 0 - 37 U/L   ALT 14 0 - 35 U/L   Alkaline Phosphatase 55 39 - 117 U/L   Total Bilirubin <0.2 (L) 0.3 - 1.2 mg/dL   GFR calc non Af Amer >90 >90 mL/min   GFR calc Af Amer >90 >90 mL/min   Anion gap 10 5 - 15  Troponin I  Result Value Ref Range   Troponin I <0.30 <0.30 ng/mL  Pro b natriuretic peptide (BNP)  Result Value Ref Range   Pro B Natriuretic peptide (BNP) 628.2 (H) 0 - 125 pg/mL  D-dimer, quantitative  Result Value Ref Range   D-Dimer, Quant 0.36 0.00 - 0.48 ug/mL-FEU  POC urine preg, ED (not at [facility])  Result Value Ref Range   Preg Test, Ur NEGATIVE NEGATIVE    Labs Review Labs Reviewed  CBC WITH DIFFERENTIAL - Abnormal; Notable for the following:    Hemoglobin 11.0 (*)    MCV 74.2 (*)    MCH 22.5 (*)    RDW 15.9 (*)    All other components within normal limits  COMPREHENSIVE METABOLIC PANEL  - Abnormal; Notable for the following:    Glucose, Bld 101 (*)    Total Bilirubin <0.2 (*)    All other components within normal limits  PRO B NATRIURETIC PEPTIDE - Abnormal; Notable for the following:    Pro B Natriuretic peptide (BNP) 628.2 (*)    All other components within normal limits  TROPONIN I  D-DIMER, QUANTITATIVE  POC URINE PREG, ED    Imaging Review Dg Chest 2 View  04/25/2014   CLINICAL DATA:  Initial evaluation for shortness of breath, cough. Chest pain.  EXAM: CHEST  2 VIEW  COMPARISON:  Prior radiograph from 02/17/2011  FINDINGS: Median sternotomy wires again noted. There is dehiscence of the most superior cerclage wire, stable. Cardiomegaly is stable. Mediastinal silhouette within normal limits.  The lungs are normally inflated. No airspace consolidation, pleural effusion, or pulmonary edema is identified. There is no pneumothorax.  No acute osseous abnormality identified.  IMPRESSION: No active cardiopulmonary disease.   Electronically Signed   By: Jeannine Boga M.D.   On: 04/25/2014 18:46     EKG Interpretation None        Date: 04/25/2014  Rate: 61  Rhythm: normal sinus rhythm  QRS Axis: normal  Intervals: PR prolonged  ST/T Wave abnormalities: normal  Conduction Disutrbances:right bundle branch block  Narrative Interpretation: PVCs, LPFB  Old EKG Reviewed: unchanged  EKG analyzed by attending physician, Dr. Regenia Skeeter   7:28 PM This provider spoke with [facility] Medicine Resident - discussed case, labs, imaging, vitals and ED course in great detail. Patient to be admitted to telemetry, observation under the care of Dr. Gwendlyn Deutscher for cardiac rule out.    MDM   Final diagnoses:  Chest pain, unspecified chest pain type    Medications  sodium chloride 0.9 % bolus 500 mL (not administered)  sodium chloride 0.9 % bolus 500 mL (0 mLs Intravenous Stopped 04/25/14 1906)  Filed Vitals:   04/25/14 1615 04/25/14 1658 04/25/14 1916  BP: 121/99 125/63 131/63   Pulse: 58 58 56  Temp: 98.7 F (37.1 C)    TempSrc: Oral    Resp: 16 18 26   SpO2: 100% 96% 98%   EKG noted normal sinus rhythm with a heart rate of 61 bpm-prolonged PR interval with ventricular premature complexes identified..  Troponin negative elevation. BNP mildly elevated at 628.2. D-dimer negative elevation. CBC unremarkable-negative elevated white blood cell count or elevated leukocytosis noted. Hemoglobin 11.0, hematocrit 36.2. CMP unremarkable-glucose 101, negative elevated anion gap-10.0 mg/L. Urine pregnancy negative. Chest x-ray negative for acute cardiopulmonary disease. Negative findings of pneumonia. Doubt PE-negative elevated d-dimer. BNP elevated-negative findings of fluid overload. Patient presenting to the ED with chest pain that started prior to cough and nasal congestion. Patient reports the chest pain is intermittent and worsens with exertion along with shortness of breath. 53 years old with pertinent history- HEART score 4. Patient to be admitted for cardiac work-up an observation. Patient given ASA 324 mg en route to the ED. Patient stable, afebrile. Patient not septic appearing. Discussed plan with patient who agrees to plan of care. Discussed case in great detail with family medicine-family medicine to admit patient for cardiac rule out. Patient stable for transfer.   Jamse Mead, PA-C 04/25/14 1937  Ephraim Hamburger, MD 04/26/14 [phone]

## 2014-04-25 NOTE — ED Notes (Signed)
Attempted report x1. 

## 2014-04-25 NOTE — ED Notes (Addendum)
Pt states L sided chest pain radiating to L arm, SOB, and cough. Ongoing x1 week. Pt states she feels very anxious. 5/10 chest pain at present. Respirations unlabored. Lung sounds clear and equal bilaterally.

## 2014-04-25 NOTE — ED Notes (Signed)
Admitting at bedside 

## 2014-04-26 ENCOUNTER — Observation Stay (HOSPITAL_COMMUNITY): Payer: Medicare Other

## 2014-04-26 DIAGNOSIS — R61 Generalized hyperhidrosis: Secondary | ICD-10-CM | POA: Diagnosis not present

## 2014-04-26 DIAGNOSIS — R011 Cardiac murmur, unspecified: Secondary | ICD-10-CM | POA: Diagnosis not present

## 2014-04-26 DIAGNOSIS — R001 Bradycardia, unspecified: Secondary | ICD-10-CM

## 2014-04-26 DIAGNOSIS — R072 Precordial pain: Secondary | ICD-10-CM

## 2014-04-26 DIAGNOSIS — I379 Nonrheumatic pulmonary valve disorder, unspecified: Secondary | ICD-10-CM

## 2014-04-26 DIAGNOSIS — R110 Nausea: Secondary | ICD-10-CM | POA: Diagnosis not present

## 2014-04-26 DIAGNOSIS — R079 Chest pain, unspecified: Secondary | ICD-10-CM

## 2014-04-26 LAB — LIPID PANEL
CHOL/HDL RATIO: 3.8 ratio
Cholesterol: 141 mg/dL (ref 0–200)
HDL: 37 mg/dL — AB (ref >39)
LDL Cholesterol: 82 mg/dL (ref 0–99)
TRIGLYCERIDES: 110 mg/dL (ref <150)
VLDL: 22 mg/dL (ref 0–40)

## 2014-04-26 LAB — TSH: TSH: 4.555 u[IU]/mL — ABNORMAL HIGH (ref 0.350–4.500)

## 2014-04-26 LAB — TROPONIN I: Troponin I: <0.03 ng/mL (ref <0.031)

## 2014-04-26 LAB — HEMOGLOBIN A1C
Hgb A1c MFr Bld: 6.2 % — ABNORMAL HIGH (ref <5.7)
Mean Plasma Glucose: 131 mg/dL — ABNORMAL HIGH (ref <117)

## 2014-04-26 LAB — T4, FREE: FREE T4: 0.99 ng/dL (ref 0.80–1.80)

## 2014-04-26 MED ORDER — REGADENOSON 0.4 MG/5ML IV SOLN
0.4000 mg | Freq: Once | INTRAVENOUS | Status: AC
  Administered 2014-04-26: 0.4 mg via INTRAVENOUS
  Filled 2014-04-26: qty 5

## 2014-04-26 MED ORDER — NITROGLYCERIN 0.4 MG SL SUBL: 0.4000 mg | SUBLINGUAL_TABLET | SUBLINGUAL | Status: DC | PRN

## 2014-04-26 MED ORDER — HYDRALAZINE HCL 20 MG/ML IJ SOLN: 10.0000 mg | Freq: Four times a day (QID) | INTRAMUSCULAR | Status: DC | PRN

## 2014-04-26 MED ORDER — METOPROLOL TARTRATE 12.5 MG HALF TABLET: 12.5000 mg | ORAL_TABLET | Freq: Two times a day (BID) | ORAL | Status: DC

## 2014-04-26 MED ORDER — TECHNETIUM TC 99M SESTAMIBI GENERIC - CARDIOLITE
30.0000 | Freq: Once | INTRAVENOUS | Status: AC | PRN
  Administered 2014-04-26: 30 via INTRAVENOUS

## 2014-04-26 MED ORDER — TECHNETIUM TC 99M SESTAMIBI GENERIC - CARDIOLITE
10.0000 | Freq: Once | INTRAVENOUS | Status: AC | PRN
  Administered 2014-04-26: 10 via INTRAVENOUS

## 2014-04-26 MED ORDER — ONDANSETRON HCL 4 MG/2ML IJ SOLN
INTRAMUSCULAR | Status: AC
  Filled 2014-04-26: qty 2

## 2014-04-26 MED ORDER — REGADENOSON 0.4 MG/5ML IV SOLN
INTRAVENOUS | Status: AC
  Administered 2014-04-26: 0.4 mg via INTRAVENOUS
  Filled 2014-04-26: qty 5

## 2014-04-26 NOTE — Discharge Instructions (Signed)
Chest Pain (Nonspecific) °It is often hard to give a specific diagnosis for the cause of chest pain. There is always a chance that your pain could be related to something serious, such as a heart attack or a blood clot in the lungs. You need to follow up with your health care provider for further evaluation. °CAUSES  °· Heartburn. °· Pneumonia or bronchitis. °· Anxiety or stress. °· Inflammation around your heart (pericarditis) or lung (pleuritis or pleurisy). °· A blood clot in the lung. °· A collapsed lung (pneumothorax). It can develop suddenly on its own (spontaneous pneumothorax) or from trauma to the chest. °· Shingles infection (herpes zoster virus). °The chest wall is composed of bones, muscles, and cartilage. Any of these can be the source of the pain. °· The bones can be bruised by injury. °· The muscles or cartilage can be strained by coughing or overwork. °· The cartilage can be affected by inflammation and become sore (costochondritis). °DIAGNOSIS  °Lab tests or other studies may be needed to find the cause of your pain. Your health care provider may have you take a test called an ambulatory electrocardiogram (ECG). An ECG records your heartbeat patterns over a 24-hour period. You may also have other tests, such as: °· Transthoracic echocardiogram (TTE). During echocardiography, sound waves are used to evaluate how blood flows through your heart. °· Transesophageal echocardiogram (TEE). °· Cardiac monitoring. This allows your health care provider to monitor your heart rate and rhythm in real time. °· Holter monitor. This is a portable device that records your heartbeat and can help diagnose heart arrhythmias. It allows your health care provider to track your heart activity for several days, if needed. °· Stress tests by exercise or by giving medicine that makes the heart beat faster. °TREATMENT  °· Treatment depends on what may be causing your chest pain. Treatment may include: °· Acid blockers for  heartburn. °· Anti-inflammatory medicine. °· Pain medicine for inflammatory conditions. °· Antibiotics if an infection is present. °· You may be advised to change lifestyle habits. This includes stopping smoking and avoiding alcohol, caffeine, and chocolate. °· You may be advised to keep your head raised (elevated) when sleeping. This reduces the chance of acid going backward from your stomach into your esophagus. °Most of the time, nonspecific chest pain will improve within 2-3 days with rest and mild pain medicine.  °HOME CARE INSTRUCTIONS  °· If antibiotics were prescribed, take them as directed. Finish them even if you start to feel better. °· For the next few days, avoid physical activities that bring on chest pain. Continue physical activities as directed. °· Do not use any tobacco products, including cigarettes, chewing tobacco, or electronic cigarettes. °· Avoid drinking alcohol. °· Only take medicine as directed by your health care provider. °· Follow your health care provider's suggestions for further testing if your chest pain does not go away. °· Keep any follow-up appointments you made. If you do not go to an appointment, you could develop lasting (chronic) problems with pain. If there is any problem keeping an appointment, call to reschedule. °SEEK MEDICAL CARE IF:  °· Your chest pain does not go away, even after treatment. °· You have a rash with blisters on your chest. °· You have a fever. °SEEK IMMEDIATE MEDICAL CARE IF:  °· You have increased chest pain or pain that spreads to your arm, neck, jaw, back, or abdomen. °· You have shortness of breath. °· You have an increasing cough, or you cough   up blood.  You have severe back or abdominal pain.  You feel nauseous or vomit.  You have severe weakness.  You faint.  You have chills. This is an emergency. Do not wait to see if the pain will go away. Get medical help at once. Call your local emergency services (911 in [place] Do not drive  yourself to the hospital. MAKE SURE YOU:   Understand these instructions.  Will watch your condition.  Will get help right away if you are not doing well or get worse. Document Released: 01/30/2005 Document Revised: 04/27/2013 Document Reviewed: 11/26/2007 [facility] Patient Information 2015 [place], [place]. This information is not intended to replace advice given to you by your health care provider. Make sure you discuss any questions you have with your health care provider.  Cardiac Diet This diet can help prevent heart disease and stroke. Many factors influence your heart health, including eating and exercise habits. Coronary risk rises a lot with abnormal blood fat (lipid) levels. Cardiac meal planning includes limiting unhealthy fats, increasing healthy fats, and making other small dietary changes. General guidelines are as follows:  Adjust calorie intake to reach and maintain desirable body weight.  Limit total fat intake to less than 30% of total calories. Saturated fat should be less than 7% of calories.  Saturated fats are found in animal products and in some vegetable products. Saturated vegetable fats are found in coconut oil, cocoa butter, palm oil, and palm kernel oil. Read labels carefully to avoid these products as much as possible. Use butter in moderation. Choose tub margarines and oils that have 2 grams of fat or less. Good cooking oils are canola and olive oils.  Practice low-fat cooking techniques. Do not fry food. Instead, broil, bake, boil, steam, grill, roast on a rack, stir-fry, or microwave it. Other fat reducing suggestions include:  Remove the skin from poultry.  Remove all visible fat from meats.  Skim the fat off stews, soups, and gravies before serving them.  Steam vegetables in water or broth instead of sauting them in fat.  Avoid foods with trans fat (or hydrogenated oils), such as commercially fried foods and commercially baked goods. Commercial  shortening and deep-frying fats will contain trans fat.  Increase intake of fruits, vegetables, whole grains, and legumes to replace foods high in fat.  Increase consumption of nuts, legumes, and seeds to at least 4 servings weekly. One serving of a legume equals  cup, and 1 serving of nuts or seeds equals  cup.  Choose whole grains more often. Have 3 servings per day (a serving is 1 ounce [oz]).  Eat 4 to 5 servings of vegetables per day. A serving of vegetables is 1 cup of raw leafy vegetables;  cup of raw or cooked cut-up vegetables;  cup of vegetable juice.  Eat 4 to 5 servings of fruit per day. A serving of fruit is 1 medium whole fruit;  cup of dried fruit;  cup of fresh, frozen, or canned fruit;  cup of 100% fruit juice.  Increase your intake of dietary fiber to 20 to 30 grams per day. Insoluble fiber may help lower your risk of heart disease and may help curb your appetite.  Soluble fiber binds cholesterol to be removed from the blood. Foods high in soluble fiber are dried beans, citrus fruits, oats, apples, bananas, broccoli, Brussels sprouts, and eggplant.  Try to include foods fortified with plant sterols or stanols, such as yogurt, breads, juices, or margarines. Choose several fortified foods  to achieve a daily intake of 2 to 3 grams of plant sterols or stanols.  Foods with omega-3 fats can help reduce your risk of heart disease. Aim to have a 3.5 oz portion of fatty fish twice per week, such as salmon, mackerel, albacore tuna, sardines, lake trout, or herring. If you wish to take a fish oil supplement, choose one that contains 1 gram of both DHA and EPA.  Limit processed meats to 2 servings (3 oz portion) weekly.  Limit the sodium in your diet to 1500 milligrams (mg) per day. If you have high blood pressure, talk to a registered dietitian about a DASH (Dietary Approaches to Stop Hypertension) eating plan.  Limit sweets and beverages with added sugar, such as soda, to no  more than 5 servings per week. One serving is:   1 tablespoon sugar.  1 tablespoon jelly or jam.   cup sorbet.  1 cup lemonade.   cup regular soda. CHOOSING FOODS Starches  Allowed: Breads: All kinds (wheat, rye, raisin, white, oatmeal, [place], [place], and English muffin bread). Low-fat rolls: English muffins, frankfurter and hamburger buns, bagels, pita bread, tortillas (not fried). Pancakes, waffles, biscuits, and muffins made with recommended oil.  Avoid: Products made with saturated or trans fats, oils, or whole milk products. Butter rolls, cheese breads, croissants. Commercial doughnuts, muffins, sweet rolls, biscuits, waffles, pancakes, store-bought mixes. Crackers  Allowed: Low-fat crackers and snacks: Animal, graham, rye, saltine (with recommended oil, no lard), oyster, and matzo crackers. Bread sticks, melba toast, rusks, flatbread, pretzels, and light popcorn.  Avoid: High-fat crackers: cheese crackers, butter crackers, and those made with coconut, palm oil, or trans fat (hydrogenated oils). Buttered popcorn. Cereals  Allowed: Hot or cold whole-grain cereals.  Avoid: Cereals containing coconut, hydrogenated vegetable fat, or animal fat. Potatoes / Pasta / Rice  Allowed: All kinds of potatoes, rice, and pasta (such as macaroni, spaghetti, and noodles).  Avoid: Pasta or rice prepared with cream sauce or high-fat cheese. Chow mein noodles, [place] fries. Vegetables  Allowed: All vegetables and vegetable juices.  Avoid: Fried vegetables. Vegetables in cream, butter, or high-fat cheese sauces. Limit coconut. Fruit in cream or custard. Protein  Allowed: Limit your intake of meat, seafood, and poultry to no more than 6 oz (cooked weight) per day. All lean, well-trimmed beef, veal, pork, and lamb. All chicken and [place] without skin. All fish and shellfish. Wild game: wild duck, rabbit, pheasant, and venison. Egg whites or low-cholesterol egg substitutes may be used as  desired. Meatless dishes: recipes with dried beans, peas, lentils, and tofu (soybean curd). Seeds and nuts: all seeds and most nuts.  Avoid: Prime grade and other heavily marbled and fatty meats, such as short ribs, spare ribs, rib eye roast or steak, frankfurters, sausage, bacon, and high-fat luncheon meats, mutton. Caviar. Commercially fried fish. Domestic duck, goose, venison sausage. Organ meats: liver, gizzard, heart, chitterlings, brains, kidney, sweetbreads. Dairy  Allowed: Low-fat cheeses: nonfat or low-fat cottage cheese (1% or 2% fat), cheeses made with part skim milk, such as mozzarella, farmers, string, or ricotta. (Cheeses should be labeled no more than 2 to 6 grams fat per oz.). Skim (or 1%) milk: liquid, powdered, or evaporated. Buttermilk made with low-fat milk. Drinks made with skim or low-fat milk or cocoa. Chocolate milk or cocoa made with skim or low-fat (1%) milk. Nonfat or low-fat yogurt.  Avoid: Whole milk cheeses, including colby, cheddar, [place], Monterey Jack, [place], [place], [place], American, Swiss, and blue. Creamed cottage cheese, cream cheese. Whole milk  and whole milk products, including buttermilk or yogurt made from whole milk, drinks made from whole milk. Condensed milk, evaporated whole milk, and 2% milk. Soups and Combination Foods  Allowed: Low-fat low-sodium soups: broth, dehydrated soups, homemade broth, soups with the fat removed, homemade cream soups made with skim or low-fat milk. Low-fat spaghetti, lasagna, chili, and Spanish rice if low-fat ingredients and low-fat cooking techniques are used.  Avoid: Cream soups made with whole milk, cream, or high-fat cheese. All other soups. Desserts and Sweets  Allowed: Sherbet, fruit ices, gelatins, meringues, and angel food cake. Homemade desserts with recommended fats, oils, and milk products. Jam, jelly, honey, marmalade, sugars, and syrups. Pure sugar candy, such as gum drops, hard candy, jelly beans,  marshmallows, mints, and small amounts of dark chocolate.  Avoid: Commercially prepared cakes, pies, cookies, frosting, pudding, or mixes for these products. Desserts containing whole milk products, chocolate, coconut, lard, palm oil, or palm kernel oil. Ice cream or ice cream drinks. Candy that contains chocolate, coconut, butter, hydrogenated fat, or unknown ingredients. Buttered syrups. Fats and Oils  Allowed: Vegetable oils: safflower, sunflower, corn, soybean, cottonseed, sesame, canola, olive, or peanut. Non-hydrogenated margarines. Salad dressing or mayonnaise: homemade or commercial, made with a recommended oil. Low or nonfat salad dressing or mayonnaise.  Limit added fats and oils to 6 to 8 tsp per day (includes fats used in cooking, baking, salads, and spreads on bread). Remember to count the "hidden fats" in foods.  Avoid: Solid fats and shortenings: butter, lard, salt pork, bacon drippings. Gravy containing meat fat, shortening, or suet. Cocoa butter, coconut. Coconut oil, palm oil, palm kernel oil, or hydrogenated oils: these ingredients are often used in bakery products, nondairy creamers, whipped toppings, candy, and commercially fried foods. Read labels carefully. Salad dressings made of unknown oils, sour cream, or cheese, such as blue cheese and Roquefort. Cream, all kinds: half-and-half, light, heavy, or whipping. Sour cream or cream cheese (even if "light" or low-fat). Nondairy cream substitutes: coffee creamers and sour cream substitutes made with palm, palm kernel, hydrogenated oils, or coconut oil. Beverages  Allowed: Coffee (regular or decaffeinated), tea. Diet carbonated beverages, mineral water. Alcohol: Check with your caregiver. Moderation is recommended.  Avoid: Whole milk, regular sodas, and juice drinks with added sugar. Condiments  Allowed: All seasonings and condiments. Cocoa powder. "Cream" sauces made with recommended ingredients.  Avoid: Carob powder made with  hydrogenated fats. SAMPLE MENU Breakfast   cup orange juice   cup oatmeal  1 slice toast  1 tsp margarine  1 cup skim milk Lunch  [place] sandwich with 2 oz [place], 2 slices bread  Lettuce and tomato slices  Fresh fruit  Carrot sticks  Coffee or tea Snack  Fresh fruit or low-fat crackers Dinner  3 oz lean ground beef  1 baked potato  1 tsp margarine   cup asparagus  Lettuce salad  1 tbs non-creamy dressing   cup peach slices  1 cup skim milk Document Released: 01/30/2008 Document Revised: 10/22/2011 Document Reviewed: 06/22/2013 [employer] Patient Information 2015 [place], [place]. This information is not intended to replace advice given to you by your health care provider. Make sure you discuss any questions you have with your health care provider.

## 2014-04-26 NOTE — Progress Notes (Signed)
UR completed 

## 2014-04-26 NOTE — Progress Notes (Signed)
   04/26/14 1554  Clinical Encounter Type  Visited With Patient;Other (Comment) Doristine Bosworth)  Visit Type Spiritual support  Referral From Nurse  Consult/Referral To Chaplain  Spiritual Encounters  Spiritual Needs Prayer  Stress Factors  Patient Stress Factors Loss;Major life changes  Chaplain responded to spiritual care consult that pt requests prayer. Pt's pastor stopped by briefly during chaplain visit. Pt talked with chaplain about the losses she has experienced this year. Chaplain provided empathic listening and encouraged self-care. Chaplain provided prayer. Will follow as needed.  Vanetta Mulders 04/26/2014 3:55 PM

## 2014-04-26 NOTE — Progress Notes (Signed)
  Echocardiogram 2D Echocardiogram has been performed.  Stephanie Bray 04/26/2014, 2:08 PM

## 2014-04-26 NOTE — Discharge Summary (Signed)
[facility] Discharge Summary  Patient name: Stephanie Bray Medical record number: [id number] Date of birth: [birth year] Age: 53 y.o. Gender: female Date of Admission: 04/25/2014  Date of Discharge: 04/26/14 Admitting Physician: Andrena Mews, MD  Primary Care Provider: Leamon Arnt, MD Consultants: Cardiology  Indication for Hospitalization: Chest pain, ACS rule out  Discharge Diagnoses/Problem List:  Chest pain Anxiety state Bradycardia  Disposition: Discharge home  Discharge Condition: Stable  Discharge Exam:  BP 148/60 mmHg  Pulse 62  Temp(Src) 98.4 F (36.9 C) (Oral)  Resp 15  Ht 4\' 10"  (1.473 m)  Wt 179 lb 9.6 oz (81.466 kg)  BMI 37.55 kg/m2  SpO2 96% General: awake, alert, pleasant, well appearing female, NAD Cardiovascular: bradycardic but RR, audible III/IV systolic mumur Respiratory: CTAB, no increased WOB, no wheeze Abdomen: soft, NT/ND, +BS Extremities: WWP Skin: dry, intact, no rashes Neuro: no focal deficits, patient follows commands  Brief Hospital Course:  Stephanie Bray is a 53 y.o. female that presented with chest pain.  PMH VSD repair as a child, anxiety and depression.  In the ED, D-dimer negative, pBNP 628.2, Troponin negative x1, EKG RBBB and 1st degree AV block with bradycardia to 50's.  However there were no signs of ischemia.  She was admitted to the [facility] with a heart score 3-4 (pre-DM).  Troponins were cycled and were negative x3.  Repeat EKG: was unchanged.  TSH 4.555, Lipid panel revealed a mildly decreased HDL to 37, and A1c 6.2.  Cardiology was consulted who recommended patient undergo a stress test for further evaluation.  Stress test was revealed Low risk with a Small anteroapical perfusion defect on stress images likely breast artifact SDS only 4 in setting of atypical chest pain and r/o.  Cardiac cath was NOT recommended.  Patient is to follow up outpatient for VSD.  2D echo was obtained; EF 55-60% and a small  restrictive either supra cristal or perimembraneous VSD with no signs of pulmonary hypertension.  Patient was discharge home in stable condition with close follow up with cardiology and recommendations to follow up closely with her PCP.  Patient was continued on her home medications for anxiety and depression.  Issues for Follow Up:   Recommend repeat TSH, elevated in hospital  Significant Procedures: Stress test  Significant Labs and Imaging:   Recent Labs Lab 04/25/14 1643  WBC 7.6  HGB 11.0*  HCT 36.2  PLT 203    Recent Labs Lab 04/25/14 1643  NA 141  K 4.0  CL 105  CO2 26  GLUCOSE 101*  BUN 16  CREATININE 0.70  CALCIUM 9.3  ALKPHOS 55  AST 19  ALT 14  ALBUMIN 3.5   Cardiac Panel (last 3 results)  Recent Labs  04/25/14 2144 04/26/14 0042 04/26/14 0331  TROPONINI <0.30 <0.03 <0.03   Lipid Panel     Component Value Date/Time   CHOL 141 04/26/2014 0331   TRIG 110 04/26/2014 0331   HDL 37* 04/26/2014 0331   CHOLHDL 3.8 04/26/2014 0331   VLDL 22 04/26/2014 0331   LDLCALC 82 04/26/2014 0331   TSH: 4.555 A1c:6.2   Dg Chest 2 View  04/25/2014   CLINICAL DATA:  Initial evaluation for shortness of breath, cough. Chest pain.  EXAM: CHEST  2 VIEW  COMPARISON:  Prior radiograph from 02/17/2011  FINDINGS: Median sternotomy wires again noted. There is dehiscence of the most superior cerclage wire, stable. Cardiomegaly is stable. Mediastinal silhouette within normal limits.  The lungs are normally  inflated. No airspace consolidation, pleural effusion, or pulmonary edema is identified. There is no pneumothorax.  No acute osseous abnormality identified.  IMPRESSION: No active cardiopulmonary disease.   Electronically Signed   By: Jeannine Boga M.D.   On: 04/25/2014 18:46   Nm Myocar Multi W/spect W/wall Motion / Ef  04/26/2014   CLINICAL DATA:  Chest pain  EXAM: Lexiscan Myovue  TECHNIQUE: The patient received IV Lexiscan .4mg  over 15 seconds. 33.0 mCi of  Technetium 79m Sestamibi injected at 30 seconds. Quantitative SPECT images were obtained in the vertical, horizontal and short axis planes after a 45 minute delay. Rest images were obtained with similar planes and delay using 10.2 mCi of Technetium 71m Sestamibi.  FINDINGS: ECG:  SR RBBB first degree no change with infusion  Symptoms: None  RAW Data: Motion  QPS: 4  Quantitative Gated SPECT EF: 67%  Perfusion Images: Likely breast artifact small anterior apical perfusion defect on stress images with SDS only 4 not likely significant in setting of atypical chest pain  IMPRESSION: Low risk myovue Small anteroapical perfusion defect on stress images likely breast artifact SDS only 4 in setting of atypical chest pain and r/o would not cath Outpatient f/u for VSD  Jenkins Rouge   Electronically Signed   By: Jenkins Rouge M.D.   On: 04/26/2014 16:05   2D echocardiogram: Study Conclusions - Left ventricle: The cavity size was normal. Wall thickness was normal. Systolic function was normal. The estimated ejection fraction was in the range of 55% to 60%. - Aortic valve: Valve area (VTI): 3.71 cm^2. Valve area (Vmax): 3.25 cm^2. Valve area (Vmean): 3.48 cm^2. - Atrial septum: No defect or patent foramen ovale was identified. - Pulmonary arteries: PA peak pressure: 42 mm Hg (S). - Impressions: Small reestrictive either supra cristal or perimembraneous VSD with no signs of pulmonary hypertension. Impressions: - Small reestrictive either supra cristal or perimembraneous VSD with no signs of pulmonary hypertension.  Results/Tests Pending at Time of Discharge: none  Discharge Medications:    Medication List    TAKE these medications        busPIRone 10 MG tablet  Commonly known as:  BUSPAR  Take 10 mg by mouth 3 (three) times daily.  Notes to Patient:  Take one dose tonight     calcium carbonate 600 MG Tabs tablet  Commonly known as:  OS-CAL  Take 600 mg by mouth daily.     fish  oil-omega-3 fatty acids 1000 MG capsule  Take 1 g by mouth daily.     FLUoxetine 20 MG tablet  Commonly known as:  PROZAC  Take 20 mg by mouth daily.     Iron 325 (65 FE) MG Tabs  Take by mouth.     metoprolol tartrate 12.5 mg Tabs tablet  Commonly known as:  LOPRESSOR  Take 0.5 tablets (12.5 mg total) by mouth 2 (two) times daily.  Notes to Patient:  Take one dose tonight     multivitamin with minerals Tabs tablet  Take 1 tablet by mouth daily.     nitroGLYCERIN 0.4 MG SL tablet  Commonly known as:  NITROSTAT  Place 1 tablet (0.4 mg total) under the tongue every 5 (five) minutes as needed for chest pain.     omeprazole 20 MG capsule  Commonly known as:  PRILOSEC  Take 20 mg by mouth daily.     promethazine 25 MG tablet  Commonly known as:  PHENERGAN  Take 1 tablet (25 mg total) by  mouth every 6 (six) hours as needed for nausea.     simvastatin 10 MG tablet  Commonly known as:  ZOCOR  Take 10 mg by mouth daily.  Notes to Patient:  Take tonight     traZODone 100 MG tablet  Commonly known as:  DESYREL  Take 300 mg by mouth at bedtime.  Notes to Patient:  Take tonight        Discharge Instructions: Please refer to Patient Instructions section of EMR for full details.  Patient was counseled important signs and symptoms that should prompt return to medical care, changes in medications, dietary instructions, activity restrictions, and follow up appointments.   Follow-Up Appointments: Follow-up Information    Follow up with ANDY,CAMILLE L, MD. Schedule an appointment as soon as possible for a visit in 1 week.   Specialty:  Family Medicine   Why:  Hospital follow up   Contact information:   [place] [phone] [place], DO 04/26/2014, 10:51 PM PGY-1, [place]

## 2014-04-26 NOTE — Progress Notes (Signed)
Family Medicine Teaching Service Daily Progress Note Intern Pager: [phone]  Patient name: Stephanie Bray Medical record number: [id number] Date of birth: 1960-08-23 Age: 53 y.o. Gender: female  Primary Care Provider: Leamon Arnt, MD Consultants: cardiology Code Status: FULL  Pt Overview and Major Events to Date:  none  Assessment and Plan: [name] is a 53 y.o. female presenting with chest pain. PMH is significant for Anxiety, depression,   #Chest pain: HEART score of 3-4.  D-Dimer neg. Pain reproducible with palpation and trop neg x3. Symptoms most likely 2/2 anxiety or reflux. EKG unchanged from last RBBB & sinus brady w/ 1st degree AV block, Lipid panel with HDL 37.   TSH 4.555 - monitor on telemetry  - Hgb A1c pending - 2D ECHO  - c/s to cards: to see this morning  #Bradycardia: noticed on EKG. No prior EKG's to compare. Asymptomatic. 12/22 EKG as above - metoprolol decreased from 25 mg BID to 12.5 mg BID   #Elevated BNP: no symptoms of orthopnea, dry cough or PND. Mild pitting edema. CXR showing no overload.  - ECHO as above   #Elevated TSH.  -FT4 ordered. -Will add Synthroid if appropriate -Consider additional labs for w/u hashimoto's  #Hx of ventral septal defect repair at [facility]: Surgery was performed at 51 months of age. reports that two stitches have "popped" out and hasn't been fixed. She can find travel to [place].  #Anxiety and depression: Felt more anxious earlier today.  - Continue buspar, prozac, trazodone   FEN/GI: heart healthy diet/ SLIV  Prophylaxis: SubQ hep   Disposition: Discharge pending ACS r/o.   Subjective:  Patient reports that CP has resolved with Tylenol that she got this morning for arm pain and headache.  She denies SOB, N/V, dizziness, abdominal pain.  She voices no concerns at this time and voices a good understanding of today's plan.  Objective: Temp:  [98 F (36.7 C)-98.7 F (37.1 C)] 98.3 F (36.8 C) (12/22  0416) Pulse Rate:  [45-62] 45 (12/22 0416) Resp:  [16-30] 18 (12/22 0416) BP: (96-151)/(33-99) 103/49 mmHg (12/22 0616) SpO2:  [96 %-100 %] 99 % (12/22 0416) Weight:  [179 lb 9.6 oz (81.466 kg)-180 lb 8 oz (81.874 kg)] 179 lb 9.6 oz (81.466 kg) (12/22 7253) Physical Exam: General: awake, alert, pleasant, well appearing female, NAD Cardiovascular: bradycardic but RR, audible III/IV systolic mumur Respiratory: CTAB, no increased WOB, no wheeze Abdomen: soft, NT/ND, +BS Extremities: [facility]  Laboratory:  Recent Labs Lab 04/25/14 1643  WBC 7.6  HGB 11.0*  HCT 36.2  PLT 203    Recent Labs Lab 04/25/14 1643  NA 141  K 4.0  CL 105  CO2 26  BUN 16  CREATININE 0.70  CALCIUM 9.3  PROT 7.1  BILITOT <0.2*  ALKPHOS 55  ALT 14  AST 19  GLUCOSE 101*   Cardiac Panel (last 3 results)  Recent Labs  04/25/14 2144 04/26/14 0042 04/26/14 0331  TROPONINI <0.30 <0.03 <0.03   Lipid Panel     Component Value Date/Time   CHOL 141 04/26/2014 0331   TRIG 110 04/26/2014 0331   HDL 37* 04/26/2014 0331   CHOLHDL 3.8 04/26/2014 0331   VLDL 22 04/26/2014 0331   LDLCALC 82 04/26/2014 0331   Imaging/Diagnostic Tests: Dg Chest 2 View  04/25/2014   CLINICAL DATA:  Initial evaluation for shortness of breath, cough. Chest pain.  EXAM: CHEST  2 VIEW  COMPARISON:  Prior radiograph from 02/17/2011  FINDINGS: Median sternotomy wires again  noted. There is dehiscence of the most superior cerclage wire, stable. Cardiomegaly is stable. Mediastinal silhouette within normal limits.  The lungs are normally inflated. No airspace consolidation, pleural effusion, or pulmonary edema is identified. There is no pneumothorax.  No acute osseous abnormality identified.  IMPRESSION: No active cardiopulmonary disease.   Electronically Signed   By: Jeannine Boga M.D.   On: 04/25/2014 18:46     Janora Norlander, DO 04/26/2014, 7:12 AM PGY-1, [facility] Intern pager: [phone], text  pages welcome

## 2014-04-26 NOTE — Consult Note (Signed)
CARDIOLOGY CONSULT NOTE       Patient ID: Stephanie Bray MRN: [id number] DOB/AGE: 07/19/1960 53 y.o.  Admit date: 04/25/2014 Referring Physician:  Gwendlyn Deutscher Primary Physician: Leamon Arnt, MD Primary Cardiologist:  New usually seen at [facility] Reason for Consultation:  Chest pain  Principal Problem:   Chest pain Active Problems:   Anxiety state   Depression   Bradycardia   HPI:   Stephanie Bray is a 53 y.o. female presenting with chest pain that has been present for the past week. URI symptoms for the past week. Having dyspnea that borough her in today. Chest pain has been present all day today. Described as tight substernal and 8/10. Left arm has been numb today. Having nausea but no vomiting, diaphoresis, and no constipation, diarrhea, or fever, chills. Nothing has helped the pain. It is unrelated to activity. Nothing makes it worse. Never had pain like this before. Worse when she sits upright. No history of reflux or trauma to the chest.  She is usually seen at [facility] ( VSD repair by history in chart)  Age 37 months  Knows of residual murmur.  She indicates not being able to f/u there anymore as she lost her care and has no transportation.  Still with some pressure in chest this am.  R/O  CXR with one loose wire superiorly mild CE and no active disease No fever sputum  No pleuritic component    ROS All other systems reviewed and negative except as noted above  Past Medical History  Diagnosis Date  . Anxiety   . Depression   . Hypertension   . High cholesterol   . Heart murmur   . Chronic bronchitis     "get it mostly q yr" (04/25/2014)  . Iron deficiency anemia   . GERD (gastroesophageal reflux disease)   . Migraine     "weekly, at least" (04/25/2014)  . Arthritis     "left hip" (04/25/2014)    Family History  Problem Relation Age of Onset  . COPD Mother   . Hypertension Father   . Hyperlipidemia Father     History   Social History  . Marital  Status: Divorced    Spouse Name: N/A    Number of Children: N/A  . Years of Education: N/A   Occupational History  . Not on file.   Social History Main Topics  . Smoking status: Never Smoker   . Smokeless tobacco: Never Used  . Alcohol Use: No  . Drug Use: No  . Sexual Activity: No   Other Topics Concern  . Not on file   Social History Narrative   Lives in [place] with her three sons.    Hobbies: reads and draw    On disability due to anxiety     Past Surgical History  Procedure Laterality Date  . Cholecystectomy    . Vsd repair  ~ 1963    [facility]/notes 04/25/2014     . busPIRone  10 mg Oral TID  . FLUoxetine  20 mg Oral Daily  . heparin  5,000 Units Subcutaneous 3 times per day  . metoprolol tartrate  12.5 mg Oral BID  . pantoprazole  80 mg Oral Daily  . regadenoson  0.4 mg Intravenous Once  . simvastatin  10 mg Oral q1800  . traZODone  300 mg Oral QHS      Physical Exam: Blood pressure 115/44, pulse 56, temperature 98.6 F (37 C), temperature source Oral, resp.  rate 19, height 4\' 10"  (1.473 m), weight 81.466 kg (179 lb 9.6 oz), SpO2 96 %.    Affect appropriate Overweight white female HEENT: normal Neck supple with no adenopathy JVP normal no bruits no thyromegaly Lungs clear with no wheezing and good diaphragmatic motion Heart:  S1/S2 loud VSD murmur, no rub, gallop or click PMI normal Abdomen: benighn, BS positve, no tenderness, no AAA no bruit.  No HSM or HJR Distal pulses intact with no bruits No edema Neuro non-focal Skin warm and dry No muscular weakness   Labs:   Lab Results  Component Value Date   WBC 7.6 04/25/2014   HGB 11.0* 04/25/2014   HCT 36.2 04/25/2014   MCV 74.2* 04/25/2014   PLT 203 04/25/2014    Recent Labs Lab 04/25/14 1643  NA 141  K 4.0  CL 105  CO2 26  BUN 16  CREATININE 0.70  CALCIUM 9.3  PROT 7.1  BILITOT <0.2*  ALKPHOS 55  ALT 14  AST 19  GLUCOSE 101*   Lab Results  Component Value Date   TROPONINI  <0.03 04/26/2014    Lab Results  Component Value Date   CHOL 141 04/26/2014   CHOL 226* 12/14/2007   Lab Results  Component Value Date   HDL 37* 04/26/2014   HDL 64 12/14/2007   Lab Results  Component Value Date   LDLCALC 82 04/26/2014   LDLCALC 141* 12/14/2007   Lab Results  Component Value Date   TRIG 110 04/26/2014   TRIG 104 12/14/2007   Lab Results  Component Value Date   CHOLHDL 3.8 04/26/2014   CHOLHDL 3.5 Ratio 12/14/2007   No results found for: LDLDIRECT     No current facility-administered medications on file prior to encounter.   Current Outpatient Prescriptions on File Prior to Encounter  Medication Sig Dispense Refill  . busPIRone (BUSPAR) 10 MG tablet Take 10 mg by mouth 3 (three) times daily.    . calcium carbonate (OS-CAL) 600 MG TABS Take 600 mg by mouth daily.    . Ferrous Sulfate (IRON) 325 (65 FE) MG TABS Take by mouth.    . fish oil-omega-3 fatty acids 1000 MG capsule Take 1 g by mouth daily.    Marland Kitchen FLUoxetine (PROZAC) 20 MG tablet Take 20 mg by mouth daily.    . Multiple Vitamin (MULTIVITAMIN WITH MINERALS) TABS Take 1 tablet by mouth daily.    Marland Kitchen omeprazole (PRILOSEC) 20 MG capsule Take 20 mg by mouth daily.    . simvastatin (ZOCOR) 10 MG tablet Take 10 mg by mouth daily.    . traZODone (DESYREL) 100 MG tablet Take 300 mg by mouth at bedtime.    . promethazine (PHENERGAN) 25 MG tablet Take 1 tablet (25 mg total) by mouth every 6 (six) hours as needed for nausea. 30 tablet 0    Radiology: Dg Chest 2 View  04/25/2014   CLINICAL DATA:  Initial evaluation for shortness of breath, cough. Chest pain.  EXAM: CHEST  2 VIEW  COMPARISON:  Prior radiograph from 02/17/2011  FINDINGS: Median sternotomy wires again noted. There is dehiscence of the most superior cerclage wire, stable. Cardiomegaly is stable. Mediastinal silhouette within normal limits.  The lungs are normally inflated. No airspace consolidation, pleural effusion, or pulmonary edema is  identified. There is no pneumothorax.  No acute osseous abnormality identified.  IMPRESSION: No active cardiopulmonary disease.   Electronically Signed   By: Jeannine Boga M.D.   On: 04/25/2014 18:46    EKG:  SR RBBB nonspecific ST/T wave changes    ASSESSMENT AND PLAN:  Chest Pain:  Atypical she does not think she can walk on treadmill  Ordered lexiscan myovue for this am VSD:  Clearly has partial dehiscence  CE on CXR.  Echo to assess defect RV function and pulmonary pressures.  With leak and likely patch repair would recommend SBE prophylaxis Chol:  Continue statin  Anxiety/Depression:  Significant  On trazodone /prosac / buspar  ? Simplify   F/u primary   Signed: Jenkins Rouge 04/26/2014, 8:33 AM

## 2014-04-26 NOTE — Progress Notes (Signed)
Lexiscan stress test completed without complication. Pending result by [facility] reader.  Patient is nauseated post procedure. Will give IV zofran  SignedAlmyra Deforest PA Pager: [phone]

## 2014-05-30 ENCOUNTER — Ambulatory Visit: Payer: Medicare Other | Admitting: Dietician

## 2014-06-14 ENCOUNTER — Encounter: Payer: Medicare Other | Attending: Family Medicine | Admitting: Dietician

## 2014-06-14 VITALS — Ht <= 58 in | Wt 173.5 lb

## 2014-06-14 DIAGNOSIS — Z6836 Body mass index (BMI) 36.0-36.9, adult: Secondary | ICD-10-CM | POA: Insufficient documentation

## 2014-06-14 DIAGNOSIS — Z713 Dietary counseling and surveillance: Secondary | ICD-10-CM | POA: Insufficient documentation

## 2014-06-14 DIAGNOSIS — E669 Obesity, unspecified: Secondary | ICD-10-CM | POA: Diagnosis present

## 2014-06-14 NOTE — Progress Notes (Signed)
  Medical Nutrition Therapy:  Appt start time: 1100 end time:  1115.  Assessment:  Primary concerns today: Stephanie Bray is here today for a follow up for obesity and Pre-Diabetes. Lost 6 lbs since last time. States that she is less than before and not as hungry as before. Went to the hospital in December since she has chest pain but did not have a MI. Not sure of blood sugar recently. Will have a follow up with her doctor about blood sugar this month.   Feeling less stressed than before. Sleeping better than before.    Wt Readings from Last 3 Encounters:  06/14/14 173 lb 8 oz (78.699 kg)  04/26/14 179 lb 9.6 oz (81.466 kg)  03/28/14 174 lb 14.4 oz (79.334 kg)   Ht Readings from Last 3 Encounters:  06/14/14 4\' 10"  (1.473 m)  04/25/14 4\' 10"  (1.473 m)  03/28/14 4\' 10"  (1.473 m)   Body mass index is 36.27 kg/(m^2). @BMIFA @ Normalized weight-for-age data available only for age 42 to 18 years. Normalized stature-for-age data available only for age 42 to 22 years.  Preferred Learning Style:   No preference indicated   Learning Readiness:   Ready  MEDICATIONS: see list   DIETARY INTAKE:  Usual eating pattern includes 3 meals and 1 snacks per day.  24-hr recall:  B ( AM): plain oatmeal or special K with milk or egg with toast, peanut butter and banana Snk ( AM): none  L ( PM): chicken nuggets, veggie, and a fruit  Snk ( PM): none D ( PM):  Protein (fish/steak), veggies, and/or spaghetti Snk ( PM): might have peanuts or peanut butter with 1 slice bread whole wheat diet coke Beverages: water, 1 Diet Coke every once in awhile  Usual physical activity: Housework  Estimated energy needs: 1600 calories 180 g carbohydrates 120 g protein 44 g fat  Progress Towards Goal(s):  In progress.   Nutritional Diagnosis:  [place]-3.3 Overweight/obesity As related to large portion sizes and excess intake of sweets.  As evidenced by BMI of 36.8 and elevated blood glucose.    Intervention:  Nutrition  counseling provided. Plan: Fill half of your plate with vegetables and have protein the size of the palm of your hand (quarter of the plate). Have starches/carbs fill one quarter of your plate. Have protein with carbohydrates for meals and snacks. Aim for a goal of 30 minutes of activity (stairs, weights, treadmill, bike peddler) most days of the week when you can. Try a [facility] Protein bar for a snack or breakfast (14 g of carbs which is ok). Add a protein (eggs, cottage cheese, or Premier Protein Shake) on the side to have with oatmeal/special K.  If you want yogurt try [place] Light and Fit (purple)  Teaching Method Utilized:  Visual Auditory Hands on  Handouts given during visit include:  Protein Shakes  Barriers to learning/adherence to lifestyle change: none  Demonstrated degree of understanding via:  Teach Back   Monitoring/Evaluation:  Dietary intake, exercise, and body weight in 3 month(s).

## 2014-06-14 NOTE — Patient Instructions (Addendum)
Fill half of your plate with vegetables and have protein the size of the palm of your hand (quarter of the plate). Have starches/carbs fill one quarter of your plate. Have protein with carbohydrates for meals and snacks. Aim for a goal of 30 minutes of activity (stairs, weights, treadmill, bike peddler) most days of the week when you can. Try a [facility] Protein bar for a snack or breakfast (14 g of carbs which is ok). Add a protein (eggs, cottage cheese, or Premier Protein Shake) on the side to have with oatmeal/special K.  If you want yogurt try [employer] Light and Fit (purple)

## 2014-09-13 ENCOUNTER — Ambulatory Visit: Payer: Medicare Other | Admitting: Dietician

## 2014-09-20 ENCOUNTER — Encounter: Payer: Self-pay | Admitting: Dietician

## 2014-09-20 ENCOUNTER — Encounter: Payer: Medicare Other | Attending: Family Medicine | Admitting: Dietician

## 2014-09-20 VITALS — Ht <= 58 in | Wt 179.8 lb

## 2014-09-20 DIAGNOSIS — Z6837 Body mass index (BMI) 37.0-37.9, adult: Secondary | ICD-10-CM | POA: Insufficient documentation

## 2014-09-20 DIAGNOSIS — Z713 Dietary counseling and surveillance: Secondary | ICD-10-CM | POA: Insufficient documentation

## 2014-09-20 DIAGNOSIS — E669 Obesity, unspecified: Secondary | ICD-10-CM | POA: Diagnosis present

## 2014-09-20 NOTE — Patient Instructions (Addendum)
Fill half of your plate with vegetables and have protein the size of the palm of your hand (quarter of the plate). Have starches/carbs fill one quarter of your plate. Have protein with carbohydrates for meals and snacks. Aim for a goal of 30 minutes of activity (stairs, weights, treadmill, bike peddler) 2-3 days of the week when you can. Try a [facility] Protein bar for a snack or breakfast (14 g of carbs which is ok). For night snack have peanut butter with either 1 slice of bread, 1/2 banana, or 6 crackers. Try adding splenda to plain oatmeal. Add egg to oatmeal or cereal breakfast for protein.  Look in the pharmacy area for Premier Protein shakes at [employer].  Bake, broil, grill, or saute fish instead of frying meat and fish.  Try having a hamburger without the bun if you want to have carbs from something.

## 2014-09-20 NOTE — Progress Notes (Signed)
  Medical Nutrition Therapy:  Appt start time: 2878 end time:  1115.  Assessment:  Primary concerns today: Stephanie Bray is here today for a follow up for obesity and Pre-Diabetes. Gained 6 lbs since last time. Has been having some celebrations recently. Will be following up with doctor next month about blood sugar. Doctor added a pill for sugar and one for her kidneys but she is not sure which ones.   Stress level is better than before.    Wt Readings from Last 3 Encounters:  09/20/14 179 lb 12.8 oz (81.557 kg)  06/14/14 173 lb 8 oz (78.699 kg)  04/26/14 179 lb 9.6 oz (81.466 kg)   Ht Readings from Last 3 Encounters:  09/20/14 4\' 10"  (1.473 m)  06/14/14 4\' 10"  (1.473 m)  04/25/14 4\' 10"  (1.473 m)   Body mass index is 37.59 kg/(m^2). @BMIFA @ Normalized weight-for-age data available only for age 51 to 2 years. Normalized stature-for-age data available only for age 51 to 44 years.   Preferred Learning Style:   No preference indicated   Learning Readiness:   Ready  MEDICATIONS: see list   DIETARY INTAKE:  Usual eating pattern includes 3 meals and 1 snacks per day.  24-hr recall:  B ( AM): special K with milk or egg with toast, peanut butter and banana Snk ( AM): none  L ( PM): chicken nuggets, veggie, and a fruit or chicken and dumplings, peas and a fruit Snk ( PM): none D ( PM):  Protein (fish/steak), meatloaf, veggies, and/or spaghetti Snk ( PM): might have peanuts or peanut butter with 1 slice bread and banana or whole wheat or yogurt or milk Beverages: water   Usual physical activity: Housework, gardening  Estimated energy needs: 1600 calories 180 g carbohydrates 120 g protein 44 g fat  Progress Towards Goal(s):  In progress.   Nutritional Diagnosis:  [place]-3.3 Overweight/obesity As related to large portion sizes and excess intake of sweets.  As evidenced by BMI of 36.8 and elevated blood glucose.    Intervention:  Nutrition counseling provided. Plan: Fill half of  your plate with vegetables and have protein the size of the palm of your hand (quarter of the plate). Have starches/carbs fill one quarter of your plate. Have protein with carbohydrates for meals and snacks. Aim for a goal of 30 minutes of activity (stairs, weights, treadmill, bike peddler) 2-3 days of the week when you can. Try a [employer] Protein bar for a snack or breakfast (14 g of carbs which is ok). For night snack have peanut butter with either 1 slice of bread, 1/2 banana, or 6 crackers. Try adding splenda to plain oatmeal. Add egg to oatmeal or cereal breakfast for protein.  Look in the pharmacy area for Premier Protein shakes at [employer].  Bake, broil, grill, or saute fish instead of frying meat and fish.  Try having a hamburger without the bun if you want to have carbs from something.   Teaching Method Utilized:  Visual Auditory Hands on  Barriers to learning/adherence to lifestyle change: none  Demonstrated degree of understanding via:  Teach Back   Monitoring/Evaluation:  Dietary intake, exercise, and body weight in 3 month(s).

## 2014-09-26 ENCOUNTER — Emergency Department (HOSPITAL_COMMUNITY)
Admission: EM | Admit: 2014-09-26 | Discharge: 2014-09-26 | Disposition: A | Discharge status: Home / Self Care | Payer: Medicare Other | Attending: Emergency Medicine | Admitting: Emergency Medicine

## 2014-09-26 ENCOUNTER — Encounter (HOSPITAL_COMMUNITY): Payer: Self-pay | Admitting: Emergency Medicine

## 2014-09-26 DIAGNOSIS — Z79899 Other long term (current) drug therapy: Secondary | ICD-10-CM | POA: Insufficient documentation

## 2014-09-26 DIAGNOSIS — B3731 Acute candidiasis of vulva and vagina: Secondary | ICD-10-CM

## 2014-09-26 DIAGNOSIS — D509 Iron deficiency anemia, unspecified: Secondary | ICD-10-CM | POA: Diagnosis not present

## 2014-09-26 DIAGNOSIS — R109 Unspecified abdominal pain: Secondary | ICD-10-CM | POA: Diagnosis present

## 2014-09-26 DIAGNOSIS — K219 Gastro-esophageal reflux disease without esophagitis: Secondary | ICD-10-CM | POA: Diagnosis not present

## 2014-09-26 DIAGNOSIS — Z8709 Personal history of other diseases of the respiratory system: Secondary | ICD-10-CM | POA: Diagnosis not present

## 2014-09-26 DIAGNOSIS — I10 Essential (primary) hypertension: Secondary | ICD-10-CM | POA: Insufficient documentation

## 2014-09-26 DIAGNOSIS — Z8739 Personal history of other diseases of the musculoskeletal system and connective tissue: Secondary | ICD-10-CM | POA: Insufficient documentation

## 2014-09-26 DIAGNOSIS — N390 Urinary tract infection, site not specified: Secondary | ICD-10-CM | POA: Diagnosis not present

## 2014-09-26 DIAGNOSIS — E780 Pure hypercholesterolemia: Secondary | ICD-10-CM | POA: Insufficient documentation

## 2014-09-26 DIAGNOSIS — G43909 Migraine, unspecified, not intractable, without status migrainosus: Secondary | ICD-10-CM | POA: Diagnosis not present

## 2014-09-26 DIAGNOSIS — F419 Anxiety disorder, unspecified: Secondary | ICD-10-CM | POA: Insufficient documentation

## 2014-09-26 DIAGNOSIS — R011 Cardiac murmur, unspecified: Secondary | ICD-10-CM | POA: Insufficient documentation

## 2014-09-26 DIAGNOSIS — F329 Major depressive disorder, single episode, unspecified: Secondary | ICD-10-CM | POA: Insufficient documentation

## 2014-09-26 DIAGNOSIS — B373 Candidiasis of vulva and vagina: Secondary | ICD-10-CM | POA: Insufficient documentation

## 2014-09-26 LAB — COMPREHENSIVE METABOLIC PANEL
ALT: 30 U/L (ref 14–54)
AST: 30 U/L (ref 15–41)
Albumin: 4.2 g/dL (ref 3.5–5.0)
Alkaline Phosphatase: 68 U/L (ref 38–126)
Anion gap: 6 (ref 5–15)
BILIRUBIN TOTAL: 0.3 mg/dL (ref 0.3–1.2)
BUN: 18 mg/dL (ref 6–20)
CALCIUM: 9.6 mg/dL (ref 8.9–10.3)
CHLORIDE: 102 mmol/L (ref 101–111)
CO2: 28 mmol/L (ref 22–32)
CREATININE: 0.55 mg/dL (ref 0.44–1.00)
GFR calc Af Amer: >60 mL/min (ref >60)
GFR calc non Af Amer: >60 mL/min (ref >60)
GLUCOSE: 128 mg/dL — AB (ref 65–99)
Potassium: 4.2 mmol/L (ref 3.5–5.1)
SODIUM: 136 mmol/L (ref 135–145)
Total Protein: 8.1 g/dL (ref 6.5–8.1)

## 2014-09-26 LAB — CBC WITH DIFFERENTIAL/PLATELET
BASOS PCT: 0 % (ref 0–1)
Basophils Absolute: 0 10*3/uL (ref 0.0–0.1)
EOS ABS: 0.2 10*3/uL (ref 0.0–0.7)
EOS PCT: 2 % (ref 0–5)
HEMATOCRIT: 38.2 % (ref 36.0–46.0)
HEMOGLOBIN: 11.7 g/dL — AB (ref 12.0–15.0)
Lymphocytes Relative: 35 % (ref 12–46)
Lymphs Abs: 3 10*3/uL (ref 0.7–4.0)
MCH: 23.1 pg — ABNORMAL LOW (ref 26.0–34.0)
MCHC: 30.6 g/dL (ref 30.0–36.0)
MCV: 75.3 fL — ABNORMAL LOW (ref 78.0–100.0)
Monocytes Absolute: 0.9 10*3/uL (ref 0.1–1.0)
Monocytes Relative: 11 % (ref 3–12)
Neutro Abs: 4.4 10*3/uL (ref 1.7–7.7)
Neutrophils Relative %: 52 % (ref 43–77)
Platelets: 266 10*3/uL (ref 150–400)
RBC: 5.07 MIL/uL (ref 3.87–5.11)
RDW: 16.3 % — ABNORMAL HIGH (ref 11.5–15.5)
WBC: 8.5 10*3/uL (ref 4.0–10.5)

## 2014-09-26 LAB — URINALYSIS, ROUTINE W REFLEX MICROSCOPIC
Bilirubin Urine: NEGATIVE
Glucose, UA: NEGATIVE mg/dL
Hgb urine dipstick: NEGATIVE
Ketones, ur: NEGATIVE mg/dL
Nitrite: NEGATIVE
PROTEIN: NEGATIVE mg/dL
Specific Gravity, Urine: 1.029 (ref 1.005–1.030)
UROBILINOGEN UA: 0.2 mg/dL (ref 0.0–1.0)
pH: 5 (ref 5.0–8.0)

## 2014-09-26 LAB — URINE MICROSCOPIC-ADD ON

## 2014-09-26 LAB — LIPASE, BLOOD: Lipase: 28 U/L (ref 22–51)

## 2014-09-26 MED ORDER — FLUCONAZOLE 150 MG PO TABS
150.0000 mg | ORAL_TABLET | Freq: Once | ORAL | Status: AC
  Administered 2014-09-26: 150 mg via ORAL
  Filled 2014-09-26: qty 1

## 2014-09-26 MED ORDER — CEPHALEXIN 250 MG PO CAPS
250.0000 mg | ORAL_CAPSULE | Freq: Once | ORAL | Status: AC
  Administered 2014-09-26: 250 mg via ORAL
  Filled 2014-09-26: qty 1

## 2014-09-26 MED ORDER — CEPHALEXIN 500 MG PO CAPS: 500.0000 mg | ORAL_CAPSULE | Freq: Four times a day (QID) | ORAL | Status: DC

## 2014-09-26 MED ORDER — FLUCONAZOLE 150 MG PO TABS: 150.0000 mg | ORAL_TABLET | Freq: Every day | ORAL | Status: AC

## 2014-09-26 NOTE — ED Provider Notes (Signed)
CSN: [id number]     Arrival date & time 09/26/14  1455 History   First MD Initiated Contact with Patient 09/26/14 1701     Chief Complaint  Patient presents with  . Abdominal Pain      HPI  Patient returns for evaluation of urinary frequency and burning. Suspect for 3-4 days. States she felt a little bit dizzy today. She states she felt like her blood sugar might of been high but she does not have a meter to check it at home. She takes oral hypoglycemics. Had UTIs in the past and states it feels similar. Some itching. No discharge. No fevers chills no nausea or vomiting.  Past Medical History  Diagnosis Date  . Anxiety   . Depression   . Hypertension   . High cholesterol   . Heart murmur   . Chronic bronchitis     "get it mostly q yr" (04/25/2014)  . Iron deficiency anemia   . GERD (gastroesophageal reflux disease)   . Migraine     "weekly, at least" (04/25/2014)  . Arthritis     "left hip" (04/25/2014)   Past Surgical History  Procedure Laterality Date  . Cholecystectomy    . Vsd repair  ~ 1963    [facility]/notes 04/25/2014   Family History  Problem Relation Age of Onset  . COPD Mother   . Hypertension Father   . Hyperlipidemia Father    History  Substance Use Topics  . Smoking status: Never Smoker   . Smokeless tobacco: Never Used  . Alcohol Use: No   OB History    No data available     Review of Systems  Constitutional: Negative for fever, chills, diaphoresis, appetite change and fatigue.  HENT: Negative for mouth sores, sore throat and trouble swallowing.   Eyes: Negative for visual disturbance.  Respiratory: Negative for cough, chest tightness, shortness of breath and wheezing.   Cardiovascular: Negative for chest pain.  Gastrointestinal: Negative for nausea, vomiting, abdominal pain, diarrhea and abdominal distention.  Endocrine: Positive for polyuria. Negative for polydipsia and polyphagia.  Genitourinary: Positive for dysuria and frequency. Negative for  hematuria.  Musculoskeletal: Negative for gait problem.  Skin: Negative for color change, pallor and rash.  Neurological: Positive for dizziness. Negative for syncope, light-headedness and headaches.  Hematological: Does not bruise/bleed easily.  Psychiatric/Behavioral: Negative for behavioral problems and confusion.      Allergies  Review of patient's allergies indicates no known allergies.  Home Medications   Prior to Admission medications   Medication Sig Start Date End Date Taking? Authorizing Provider  busPIRone (BUSPAR) 10 MG tablet Take 10 mg by mouth 3 (three) times daily.   Yes Historical Provider, MD  calcium carbonate (OS-CAL) 600 MG TABS Take 600 mg by mouth daily.   Yes Historical Provider, MD  Ferrous Sulfate (IRON) 325 (65 FE) MG TABS Take 1 tablet by mouth once a week. On Sundays.   Yes Historical Provider, MD  fish oil-omega-3 fatty acids 1000 MG capsule Take 1 g by mouth daily.   Yes Historical Provider, MD  FLUoxetine (PROZAC) 20 MG tablet Take 20 mg by mouth daily.   Yes Historical Provider, MD  metFORMIN (GLUCOPHAGE-XR) 500 MG 24 hr tablet Take 500 mg by mouth daily with breakfast.   Yes Historical Provider, MD  metoprolol tartrate (LOPRESSOR) 12.5 mg TABS tablet Take 0.5 tablets (12.5 mg total) by mouth 2 (two) times daily. 04/26/14  Yes [place] N Rumley, DO  Multiple Vitamin (MULTIVITAMIN WITH MINERALS) TABS  Take 1 tablet by mouth daily.   Yes Historical Provider, MD  nitroGLYCERIN (NITROSTAT) 0.4 MG SL tablet Place 1 tablet (0.4 mg total) under the tongue every 5 (five) minutes as needed for chest pain. 04/26/14  Yes [place] N Rumley, DO  omeprazole (PRILOSEC) 20 MG capsule Take 20 mg by mouth daily.   Yes Historical Provider, MD  simvastatin (ZOCOR) 10 MG tablet Take 10 mg by mouth daily.   Yes Historical Provider, MD  traZODone (DESYREL) 100 MG tablet Take 300 mg by mouth at bedtime.   Yes Historical Provider, MD  cephALEXin (KEFLEX) 500 MG capsule Take 1  capsule (500 mg total) by mouth 4 (four) times daily. 09/26/14   Tanna Furry, MD  fluconazole (DIFLUCAN) 150 MG tablet Take 1 tablet (150 mg total) by mouth daily. 09/26/14 10/03/14  Tanna Furry, MD  metFORMIN (GLUCOPHAGE) 500 MG tablet Take 500 mg by mouth daily with breakfast.    Historical Provider, MD  promethazine (PHENERGAN) 25 MG tablet Take 1 tablet (25 mg total) by mouth every 6 (six) hours as needed for nausea. 12/09/11 01/19/93  Delora Fuel, MD   BP 503/88 mmHg  Pulse 58  Temp(Src) 98.6 F (37 C) (Oral)  Resp 18  SpO2 96% Physical Exam  Constitutional: She is oriented to person, place, and time. She appears well-developed and well-nourished. No distress.  HENT:  Head: Normocephalic.  Eyes: Conjunctivae are normal. Pupils are equal, round, and reactive to light. No scleral icterus.  Neck: Normal range of motion. Neck supple. No thyromegaly present.  Cardiovascular: Normal rate and regular rhythm.  Exam reveals no gallop and no friction rub.   No murmur heard. Pulmonary/Chest: Effort normal and breath sounds normal. No respiratory distress. She has no wheezes. She has no rales.  Abdominal: Soft. Bowel sounds are normal. She exhibits no distension. There is no tenderness. There is no rebound.  Musculoskeletal: Normal range of motion.  Neurological: She is alert and oriented to person, place, and time.  Skin: Skin is warm and dry. No rash noted.  Psychiatric: She has a normal mood and affect. Her behavior is normal.    ED Course  Procedures (including critical care time) Labs Review Labs Reviewed  CBC WITH DIFFERENTIAL/PLATELET - Abnormal; Notable for the following:    Hemoglobin 11.7 (*)    MCV 75.3 (*)    MCH 23.1 (*)    RDW 16.3 (*)    All other components within normal limits  COMPREHENSIVE METABOLIC PANEL - Abnormal; Notable for the following:    Glucose, Bld 128 (*)    All other components within normal limits  URINALYSIS, ROUTINE W REFLEX MICROSCOPIC - Abnormal; Notable  for the following:    APPearance CLOUDY (*)    Leukocytes, UA LARGE (*)    All other components within normal limits  URINE MICROSCOPIC-ADD ON - Abnormal; Notable for the following:    Squamous Epithelial / LPF FEW (*)    Bacteria, UA FEW (*)    All other components within normal limits  URINE CULTURE  LIPASE, BLOOD    Imaging Review No results found.   EKG Interpretation None      MDM   Final diagnoses:  UTI (lower urinary tract infection)  Vaginal candidiasis    By mouth Keflex.  Diflucan. Keflex 1 week. Repeat Diflucan 1 week. Culture pending.    Tanna Furry, MD 09/26/14 [phone]

## 2014-09-26 NOTE — ED Notes (Signed)
Pt c/o dysuria, low abdominal pain, and headache onset Friday.

## 2014-09-26 NOTE — ED Notes (Signed)
Awake. Verbally responsive. A/O x4. Resp even and unlabored. No audible adventitious breath sounds noted. ABC's intact.  

## 2014-09-26 NOTE — Discharge Instructions (Signed)
Candida Infection A Candida infection (also called yeast, fungus, and Monilia infection) is an overgrowth of yeast that can occur anywhere on the body. A yeast infection commonly occurs in warm, moist body areas. Usually, the infection remains localized but can spread to become a systemic infection. A yeast infection may be a sign of a more severe disease such as diabetes, leukemia, or AIDS. A yeast infection can occur in both men and women. In women, Candida vaginitis is a vaginal infection. It is one of the most common causes of vaginitis. Men usually do not have symptoms or know they have an infection until other problems develop. Men may find out they have a yeast infection because their sex partner has a yeast infection. Uncircumcised men are more likely to get a yeast infection than circumcised men. This is because the uncircumcised glans is not exposed to air and does not remain as dry as that of a circumcised glans. Older adults may develop yeast infections around dentures. CAUSES  Women  Antibiotics.  Steroid medication taken for a long time.  Being overweight (obese).  Diabetes.  Poor immune condition.  Certain serious medical conditions.  Immune suppressive medications for organ transplant patients.  Chemotherapy.  Pregnancy.  Menstruation.  Stress and fatigue.  Intravenous drug use.  Oral contraceptives.  Wearing tight-fitting clothes in the crotch area.  Catching it from a sex partner who has a yeast infection.  Spermicide.  Intravenous, urinary, or other catheters. Men  Catching it from a sex partner who has a yeast infection.  Having oral or anal sex with a person who has the infection.  Spermicide.  Diabetes.  Antibiotics.  Poor immune system.  Medications that suppress the immune system.  Intravenous drug use.  Intravenous, urinary, or other catheters. SYMPTOMS  Women  Thick, white vaginal discharge.  Vaginal itching.  Redness and  swelling in and around the vagina.  Irritation of the lips of the vagina and perineum.  Blisters on the vaginal lips and perineum.  Painful sexual intercourse.  Low blood sugar (hypoglycemia).  Painful urination.  Bladder infections.  Intestinal problems such as constipation, indigestion, bad breath, bloating, increase in gas, diarrhea, or loose stools. Men  Men may develop intestinal problems such as constipation, indigestion, bad breath, bloating, increase in gas, diarrhea, or loose stools.  Dry, cracked skin on the penis with itching or discomfort.  Jock itch.  Dry, flaky skin.  Athlete's foot.  Hypoglycemia. DIAGNOSIS  Women  A history and an exam are performed.  The discharge may be examined under a microscope.  A culture may be taken of the discharge. Men  A history and an exam are performed.  Any discharge from the penis or areas of cracked skin will be looked at under the microscope and cultured.  Stool samples may be cultured. TREATMENT  Women  Vaginal antifungal suppositories and creams.  Medicated creams to decrease irritation and itching on the outside of the vagina.  Warm compresses to the perineal area to decrease swelling and discomfort.  Oral antifungal medications.  Medicated vaginal suppositories or cream for repeated or recurrent infections.  Wash and dry the irritation areas before applying the cream.  Eating yogurt with Lactobacillus may help with prevention and treatment.  Sometimes painting the vagina with gentian violet solution may help if creams and suppositories do not work. Men  Antifungal creams and oral antifungal medications.  Sometimes treatment must continue for 30 days after the symptoms go away to prevent recurrence. HOME CARE INSTRUCTIONS  Women  Use cotton underwear and avoid tight-fitting clothing.  Avoid colored, scented toilet paper and deodorant tampons or pads.  Do not douche.  Keep your diabetes  under control.  Finish all the prescribed medications.  Keep your skin clean and dry.  Consume milk or yogurt with Lactobacillus-active culture regularly. If you get frequent yeast infections and think that is what the infection is, there are over-the-counter medications that you can get. If the infection does not show healing in 3 days, talk to your caregiver.  Tell your sex partner you have a yeast infection. Your partner may need treatment also, especially if your infection does not clear up or recurs. Men  Keep your skin clean and dry.  Keep your diabetes under control.  Finish all prescribed medications.  Tell your sex partner that you have a yeast infection so he or she can be treated if necessary. SEEK MEDICAL CARE IF:   Your symptoms do not clear up or worsen in one week after treatment.  You have an oral temperature above 102 F (38.9 C).  You have trouble swallowing or eating for a prolonged time.  You develop blisters on and around your vagina.  You develop vaginal bleeding and it is not your menstrual period.  You develop abdominal pain.  You develop intestinal problems as mentioned above.  You get weak or light-headed.  You have painful or increased urination.  You have pain during sexual intercourse. MAKE SURE YOU:   Understand these instructions.  Will watch your condition.  Will get help right away if you are not doing well or get worse. Document Released: 05/30/2004 Document Revised: 09/06/2013 Document Reviewed: 09/11/2009 [facility] Patient Information 2015 [place], [place]. This information is not intended to replace advice given to you by your health care provider. Make sure you discuss any questions you have with your health care provider.  Urinary Tract Infection Urinary tract infections (UTIs) can develop anywhere along your urinary tract. Your urinary tract is your body's drainage system for removing wastes and extra water. Your urinary tract  includes two kidneys, two ureters, a bladder, and a urethra. Your kidneys are a pair of bean-shaped organs. Each kidney is about the size of your fist. They are located below your ribs, one on each side of your spine. CAUSES Infections are caused by microbes, which are microscopic organisms, including fungi, viruses, and bacteria. These organisms are so small that they can only be seen through a microscope. Bacteria are the microbes that most commonly cause UTIs. SYMPTOMS  Symptoms of UTIs may vary by age and gender of the patient and by the location of the infection. Symptoms in young women typically include a frequent and intense urge to urinate and a painful, burning feeling in the bladder or urethra during urination. Older women and men are more likely to be tired, shaky, and weak and have muscle aches and abdominal pain. A fever may mean the infection is in your kidneys. Other symptoms of a kidney infection include pain in your back or sides below the ribs, nausea, and vomiting. DIAGNOSIS To diagnose a UTI, your caregiver will ask you about your symptoms. Your caregiver also will ask to provide a urine sample. The urine sample will be tested for bacteria and white blood cells. White blood cells are made by your body to help fight infection. TREATMENT  Typically, UTIs can be treated with medication. Because most UTIs are caused by a bacterial infection, they usually can be treated with the use  of antibiotics. The choice of antibiotic and length of treatment depend on your symptoms and the type of bacteria causing your infection. HOME CARE INSTRUCTIONS  If you were prescribed antibiotics, take them exactly as your caregiver instructs you. Finish the medication even if you feel better after you have only taken some of the medication.  Drink enough water and fluids to keep your urine clear or pale yellow.  Avoid caffeine, tea, and carbonated beverages. They tend to irritate your bladder.  Empty  your bladder often. Avoid holding urine for long periods of time.  Empty your bladder before and after sexual intercourse.  After a bowel movement, women should cleanse from front to back. Use each tissue only once. SEEK MEDICAL CARE IF:   You have back pain.  You develop a fever.  Your symptoms do not begin to resolve within 3 days. SEEK IMMEDIATE MEDICAL CARE IF:   You have severe back pain or lower abdominal pain.  You develop chills.  You have nausea or vomiting.  You have continued burning or discomfort with urination. MAKE SURE YOU:   Understand these instructions.  Will watch your condition.  Will get help right away if you are not doing well or get worse. Document Released: 01/30/2005 Document Revised: 10/22/2011 Document Reviewed: 05/31/2011 [facility] Patient Information 2015 [place], [place]. This information is not intended to replace advice given to you by your health care provider. Make sure you discuss any questions you have with your health care provider.

## 2014-09-26 NOTE — ED Notes (Addendum)
Awake. Verbally responsive. Resp even and unlabored. No audible adventitious breath sounds noted. ABC's intact. Abd soft/nondistended but tender to palpate. BS (+) and active x4 quadrants. No N/V/D reported. Pt reported having lower abd and back pain with burning with voiding, abd pressure, urinary frequency/urgency, and foul odor urine. Pt reported having dizziness at times.

## 2014-09-27 LAB — URINE CULTURE

## 2014-12-20 ENCOUNTER — Encounter: Payer: Self-pay | Admitting: Dietician

## 2014-12-20 ENCOUNTER — Encounter: Payer: Medicare Other | Attending: Family Medicine | Admitting: Dietician

## 2014-12-20 VITALS — Ht <= 58 in | Wt 172.5 lb

## 2014-12-20 DIAGNOSIS — E669 Obesity, unspecified: Secondary | ICD-10-CM

## 2014-12-20 DIAGNOSIS — Z713 Dietary counseling and surveillance: Secondary | ICD-10-CM | POA: Diagnosis not present

## 2014-12-20 NOTE — Progress Notes (Signed)
  Medical Nutrition Therapy:  Appt start time: 1020 end time: 8182 .  Assessment:  Primary concerns today: Stephanie Bray is here today for a follow up for obesity and Pre-Diabetes. Lost 7 lbs since last time. Had teeth pulled 2 weeks ago ago and now has dentures. Had been on soft foods but has moved on to protein foods. Has not had anything sweet in 2-3 months. Feels like she is going to the bathroom all the time. Will be following up with doctor in September. Has been told she had "mild diabetes" and takes Metformin. Not testing blood sugar.   Wt Readings from Last 3 Encounters:  12/20/14 172 lb 8 oz (78.245 kg)  09/20/14 179 lb 12.8 oz (81.557 kg)  06/14/14 173 lb 8 oz (78.699 kg)   Ht Readings from Last 3 Encounters:  12/20/14 4\' 10"  (1.473 m)  09/20/14 4\' 10"  (1.473 m)  06/14/14 4\' 10"  (1.473 m)   Body mass index is 36.06 kg/(m^2). @BMIFA @ Normalized weight-for-age data available only for age 24 to 24 years. Normalized stature-for-age data available only for age 24 to 34 years.   Preferred Learning Style:   No preference indicated   Learning Readiness:   Ready  MEDICATIONS: see list   DIETARY INTAKE:  Usual eating pattern includes 3 meals and 1 snacks per day.  24-hr recall:  B ( AM): 2 eggs with coffee with sweet and low Snk ( AM): none  L ( PM): liverwurst sandwich with applesauce Snk ( PM): none D ( PM):  Protein (fish/schicken) with veggies Snk ( PM): yogurt  Beverages: water or diet coke  Usual physical activity: Housework   Estimated energy needs: 1600 calories 180 g carbohydrates 120 g protein 44 g fat  Progress Towards Goal(s):  In progress.   Nutritional Diagnosis:  -3.3 Overweight/obesity As related to large portion sizes and excess intake of sweets.  As evidenced by BMI of 36.8 and elevated blood glucose.    Intervention:  Nutrition counseling provided. Plan: Fill half of your plate with vegetables and have protein the size of the palm of your hand  (quarter of the plate). Have starches/carbs fill one quarter of your plate. Have protein with carbohydrates for meals and snacks. Aim for a goal of 30 minutes of activity (stairs, weights, treadmill, bike peddler) 2-3 days of the week when you can. Try regular Cheerios - measure portion size and have egg on the side. Try a Hydrographic surveyor with crackers, bread, or fruit to have for a meal. For your birthday, have dessert with protein and veggies (instead of starch at meal).  Teaching Method Utilized:  Visual Auditory Hands on  Barriers to learning/adherence to lifestyle change: none  Demonstrated degree of understanding via:  Teach Back   Monitoring/Evaluation:  Dietary intake, exercise, and body weight in 3 month(s).

## 2014-12-20 NOTE — Patient Instructions (Addendum)
Fill half of your plate with vegetables and have protein the size of the palm of your hand (quarter of the plate). Have starches/carbs fill one quarter of your plate. Have protein with carbohydrates for meals and snacks. Aim for a goal of 30 minutes of activity (stairs, weights, treadmill, bike peddler) 2-3 days of the week when you can. Try regular Cheerios - measure portion size and have egg on the side. Try a Hydrographic surveyor with crackers, bread, or fruit to have for a meal. For your birthday, have dessert with protein and veggies (instead of starch at meal).

## 2015-03-22 ENCOUNTER — Ambulatory Visit: Payer: Medicare Other | Admitting: Dietician

## 2015-03-29 ENCOUNTER — Ambulatory Visit: Payer: Medicare Other | Admitting: Dietician

## 2015-04-24 ENCOUNTER — Ambulatory Visit: Payer: Medicare Other | Admitting: Dietician

## 2015-07-10 ENCOUNTER — Encounter: Payer: Self-pay | Admitting: Dietician

## 2015-07-10 ENCOUNTER — Encounter: Payer: Medicare Other | Attending: Family Medicine | Admitting: Dietician

## 2015-07-10 VITALS — Wt 170.0 lb

## 2015-07-10 DIAGNOSIS — Z029 Encounter for administrative examinations, unspecified: Secondary | ICD-10-CM | POA: Diagnosis present

## 2015-07-10 DIAGNOSIS — R7303 Prediabetes: Secondary | ICD-10-CM

## 2015-07-10 NOTE — Progress Notes (Signed)
  Medical Nutrition Therapy:  Appt start time: 1100 end time: 1130 .  Assessment:  Primary concerns today: Stephanie Bray is here today for a follow up for obesity and Pre-Diabetes. Lost 2 lbs since last time.  She states that she has been craving sweets lately and wants to know what she can eat.  She has started walking on the treadmill.  Wt Readings from Last 3 Encounters:  07/10/15 170 lb (77.111 kg)  12/20/14 172 lb 8 oz (78.245 kg)  09/20/14 179 lb 12.8 oz (81.557 kg)   Ht Readings from Last 3 Encounters:  12/20/14 4\' 10"  (1.473 m)  09/20/14 4\' 10"  (1.473 m)  06/14/14 4\' 10"  (1.473 m)   Body mass index is 35.54 kg/(m^2). @BMIFA @ Facility age limit for growth percentiles is 20 years. Facility age limit for growth percentiles is 20 years.  Preferred Learning Style:   No preference indicated   Learning Readiness:   Ready  MEDICATIONS: see list   DIETARY INTAKE:  Usual eating pattern includes 3 meals and 1 snacks per day.  24-hr recall:  B ( AM): Apple jacks cereal and 1% milk Snk ( AM): fruit  L ( PM): BBQ or chicken on bun Snk ( PM): 1/2 peanut butter sandwich and milk D ( PM):  Protein (fish/schicken) with veggies Snk ( PM): none Beverages: water or diet coke  Usual physical activity: Housework.  She has started to walk on the treadmill.  Estimated energy needs: 1600 calories 180 g carbohydrates 120 g protein 44 g fat  Progress Towards Goal(s):  In progress.   Nutritional Diagnosis:  [place]-3.3 Overweight/obesity As related to large portion sizes and excess intake of sweets.  As evidenced by BMI of 36.8 and elevated blood glucose.    Intervention:  Nutrition counseling provided. Plan: Great job on starting the exercise habit. Continue to watch your portion sizes. Fill half of your plate with vegetables and have protein the size of the palm of your hand (quarter of the plate). Starches (one quarter of your plate) See list for healthier breakfast options. Choose  plain Cheerios, chix, wheaties, shredded what, Rice Krispies or plain Special K rather than sweet. When you are craving something sweet try:  Fruit with small amount peanut butter, yogurt, or string cheese.  3 squares graham crackers with peanut butter  5 vanilla wafers topped with peanut butter and a slice of banana  1/2 cup sugar free pudding  Teaching Method Utilized:  Visual Auditory Hands on  Handouts:  My Plate  Breakfast ideas  Snack list  Barriers to learning/adherence to lifestyle change: none  Demonstrated degree of understanding via:  Teach Back   Monitoring/Evaluation:  Dietary intake, exercise, and body weight 2 months.

## 2015-07-10 NOTE — Patient Instructions (Signed)
Great job on starting the exercise habit. Continue to watch your portion sizes. Fill half of your plate with vegetables and have protein the size of the palm of your hand (quarter of the plate). Starches (one quarter of your plate) See list for healthier breakfast options. Choose plain Cheerios, chix, wheaties, shredded what, Rice Krispies or plain Special K rather than sweet. When you are craving something sweet try:  Fruit with small amount peanut butter, yogurt, or string cheese.  3 squares graham crackers with peanut butter  5 vanilla wafers topped with peanut butter and a slice of banana  1/2 cup sugar free pudding

## 2015-07-18 ENCOUNTER — Emergency Department (HOSPITAL_COMMUNITY): Payer: Medicare Other

## 2015-07-18 ENCOUNTER — Observation Stay (HOSPITAL_COMMUNITY): Payer: Medicare Other

## 2015-07-18 ENCOUNTER — Observation Stay (HOSPITAL_COMMUNITY)
Admission: EM | Admit: 2015-07-18 | Discharge: 2015-07-20 | Disposition: A | Discharge status: Home / Self Care | Payer: Medicare Other | Attending: Internal Medicine | Admitting: Internal Medicine

## 2015-07-18 ENCOUNTER — Encounter (HOSPITAL_COMMUNITY): Payer: Self-pay | Admitting: Emergency Medicine

## 2015-07-18 DIAGNOSIS — Z7982 Long term (current) use of aspirin: Secondary | ICD-10-CM | POA: Diagnosis not present

## 2015-07-18 DIAGNOSIS — H547 Unspecified visual loss: Secondary | ICD-10-CM | POA: Insufficient documentation

## 2015-07-18 DIAGNOSIS — M25512 Pain in left shoulder: Secondary | ICD-10-CM | POA: Diagnosis not present

## 2015-07-18 DIAGNOSIS — E785 Hyperlipidemia, unspecified: Secondary | ICD-10-CM | POA: Diagnosis present

## 2015-07-18 DIAGNOSIS — F419 Anxiety disorder, unspecified: Secondary | ICD-10-CM | POA: Diagnosis not present

## 2015-07-18 DIAGNOSIS — H538 Other visual disturbances: Secondary | ICD-10-CM | POA: Diagnosis not present

## 2015-07-18 DIAGNOSIS — Z7984 Long term (current) use of oral hypoglycemic drugs: Secondary | ICD-10-CM | POA: Insufficient documentation

## 2015-07-18 DIAGNOSIS — R079 Chest pain, unspecified: Secondary | ICD-10-CM

## 2015-07-18 DIAGNOSIS — E119 Type 2 diabetes mellitus without complications: Secondary | ICD-10-CM

## 2015-07-18 DIAGNOSIS — G459 Transient cerebral ischemic attack, unspecified: Secondary | ICD-10-CM | POA: Diagnosis not present

## 2015-07-18 DIAGNOSIS — R531 Weakness: Secondary | ICD-10-CM | POA: Insufficient documentation

## 2015-07-18 DIAGNOSIS — G43909 Migraine, unspecified, not intractable, without status migrainosus: Secondary | ICD-10-CM | POA: Insufficient documentation

## 2015-07-18 DIAGNOSIS — R51 Headache: Secondary | ICD-10-CM | POA: Diagnosis not present

## 2015-07-18 DIAGNOSIS — G43711 Chronic migraine without aura, intractable, with status migrainosus: Secondary | ICD-10-CM | POA: Insufficient documentation

## 2015-07-18 DIAGNOSIS — R55 Syncope and collapse: Secondary | ICD-10-CM | POA: Insufficient documentation

## 2015-07-18 DIAGNOSIS — R519 Headache, unspecified: Secondary | ICD-10-CM | POA: Insufficient documentation

## 2015-07-18 DIAGNOSIS — M62838 Other muscle spasm: Secondary | ICD-10-CM | POA: Insufficient documentation

## 2015-07-18 DIAGNOSIS — H53139 Sudden visual loss, unspecified eye: Secondary | ICD-10-CM | POA: Insufficient documentation

## 2015-07-18 HISTORY — DX: Ventricular septal defect: Q21.0

## 2015-07-18 HISTORY — DX: Chest pain, unspecified: R07.9

## 2015-07-18 HISTORY — DX: Essential (primary) hypertension: I10

## 2015-07-18 LAB — URINALYSIS, ROUTINE W REFLEX MICROSCOPIC
Bilirubin Urine: NEGATIVE
GLUCOSE, UA: NEGATIVE mg/dL
Hgb urine dipstick: NEGATIVE
KETONES UR: NEGATIVE mg/dL
Nitrite: NEGATIVE
PH: 5 (ref 5.0–8.0)
Protein, ur: NEGATIVE mg/dL
SPECIFIC GRAVITY, URINE: 1.025 (ref 1.005–1.030)

## 2015-07-18 LAB — CBC
HEMATOCRIT: 38.2 % (ref 36.0–46.0)
HEMOGLOBIN: 11.6 g/dL — AB (ref 12.0–15.0)
MCH: 22.8 pg — ABNORMAL LOW (ref 26.0–34.0)
MCHC: 30.4 g/dL (ref 30.0–36.0)
MCV: 75.2 fL — AB (ref 78.0–100.0)
Platelets: 250 10*3/uL (ref 150–400)
RBC: 5.08 MIL/uL (ref 3.87–5.11)
RDW: 15.8 % — AB (ref 11.5–15.5)
WBC: 8.4 10*3/uL (ref 4.0–10.5)

## 2015-07-18 LAB — I-STAT CHEM 8, ED
BUN: 17 mg/dL (ref 6–20)
CALCIUM ION: 1.17 mmol/L (ref 1.12–1.23)
CHLORIDE: 102 mmol/L (ref 101–111)
CREATININE: 0.6 mg/dL (ref 0.44–1.00)
GLUCOSE: 90 mg/dL (ref 65–99)
HCT: 41 % (ref 36.0–46.0)
Hemoglobin: 13.9 g/dL (ref 12.0–15.0)
POTASSIUM: 4 mmol/L (ref 3.5–5.1)
Sodium: 140 mmol/L (ref 135–145)
TCO2: 27 mmol/L (ref 0–100)

## 2015-07-18 LAB — RAPID URINE DRUG SCREEN, HOSP PERFORMED
Amphetamines: NOT DETECTED
Barbiturates: NOT DETECTED
Benzodiazepines: NOT DETECTED
COCAINE: NOT DETECTED
OPIATES: NOT DETECTED
TETRAHYDROCANNABINOL: NOT DETECTED

## 2015-07-18 LAB — COMPREHENSIVE METABOLIC PANEL
ALK PHOS: 53 U/L (ref 38–126)
ALT: 20 U/L (ref 14–54)
ANION GAP: 7 (ref 5–15)
AST: 24 U/L (ref 15–41)
Albumin: 3.7 g/dL (ref 3.5–5.0)
BILIRUBIN TOTAL: 0.5 mg/dL (ref 0.3–1.2)
BUN: 13 mg/dL (ref 6–20)
CO2: 28 mmol/L (ref 22–32)
Calcium: 9.7 mg/dL (ref 8.9–10.3)
Chloride: 105 mmol/L (ref 101–111)
Creatinine, Ser: 0.54 mg/dL (ref 0.44–1.00)
GFR calc Af Amer: >60 mL/min (ref >60)
GFR calc non Af Amer: >60 mL/min (ref >60)
GLUCOSE: 87 mg/dL (ref 65–99)
POTASSIUM: 3.9 mmol/L (ref 3.5–5.1)
Sodium: 140 mmol/L (ref 135–145)
TOTAL PROTEIN: 7.3 g/dL (ref 6.5–8.1)

## 2015-07-18 LAB — DIFFERENTIAL
BASOS ABS: 0.1 10*3/uL (ref 0.0–0.1)
BASOS PCT: 1 %
Eosinophils Absolute: 0.2 10*3/uL (ref 0.0–0.7)
Eosinophils Relative: 2 %
Lymphocytes Relative: 33 %
Lymphs Abs: 2.7 10*3/uL (ref 0.7–4.0)
MONO ABS: 0.8 10*3/uL (ref 0.1–1.0)
MONOS PCT: 10 %
NEUTROS ABS: 4.6 10*3/uL (ref 1.7–7.7)
Neutrophils Relative %: 55 %

## 2015-07-18 LAB — I-STAT TROPONIN, ED
TROPONIN I, POC: 0.01 ng/mL (ref 0.00–0.08)
Troponin i, poc: 0 ng/mL (ref 0.00–0.08)

## 2015-07-18 LAB — ETHANOL: Alcohol, Ethyl (B): <5 mg/dL (ref <5)

## 2015-07-18 LAB — URINE MICROSCOPIC-ADD ON

## 2015-07-18 LAB — PROTIME-INR
INR: 1.04 (ref 0.00–1.49)
Prothrombin Time: 13.8 seconds (ref 11.6–15.2)

## 2015-07-18 LAB — APTT: APTT: 30 s (ref 24–37)

## 2015-07-18 MED ORDER — MORPHINE SULFATE (PF) 4 MG/ML IV SOLN
4.0000 mg | Freq: Once | INTRAVENOUS | Status: AC
  Administered 2015-07-18: 4 mg via INTRAVENOUS
  Filled 2015-07-18: qty 1

## 2015-07-18 MED ORDER — FENTANYL CITRATE (PF) 100 MCG/2ML IJ SOLN
50.0000 ug | Freq: Once | INTRAMUSCULAR | Status: AC
  Administered 2015-07-18: 50 ug via INTRAVENOUS
  Filled 2015-07-18: qty 2

## 2015-07-18 MED ORDER — ONDANSETRON HCL 4 MG/2ML IJ SOLN
4.0000 mg | Freq: Once | INTRAMUSCULAR | Status: AC
  Administered 2015-07-18: 4 mg via INTRAVENOUS
  Filled 2015-07-18: qty 2

## 2015-07-18 MED ORDER — SODIUM CHLORIDE 0.9 % IV BOLUS (SEPSIS)
500.0000 mL | Freq: Once | INTRAVENOUS | Status: AC
  Administered 2015-07-18: 500 mL via INTRAVENOUS

## 2015-07-18 NOTE — ED Notes (Signed)
MD at bedside. 

## 2015-07-18 NOTE — H&P (Signed)
[facility] History and Physical  Stephanie Bray [id number] DOB: 26-Apr-1961 DOA: 07/18/2015  Referring physician: Carlis Abbott. PCP: Leamon Arnt, MD  Specialists: None.  Chief Complaint: Blurred vision.  HPI: Stephanie Bray is a 55 y.o. female with history of diabetes mellitus type 2 and VSD repair, hyperlipidemia presents to the ER because of blurred vision. Patient states last evening while she was driving patient had transiently lost vision in both eyes almost a minute and had to call helpful driving. This morning when she woke up she had blurred vision and had come to the ER. MRI of the brain was unremarkable. While in the ER patient started developing multiple episodes of chest discomfort which was pressure-like lasting for a few minutes each time. Patient also had difficulty swallowing failing swallow evaluation. Patient has been admitted for further management of possible TIA and chest discomfort. In addition patient has been having left arm and left shoulder pain for many weeks. On exam patient is able to move all extremities. Denies any difficulty talking. Neurologist on call Dr. Wendee Beavers was consulted by ER physician.  Review of Systems: As presented in the history of presenting illness, rest negative.  Past Medical History  Diagnosis Date  . Anxiety   . Depression   . Hypertension   . High cholesterol   . Heart murmur   . Chronic bronchitis ([place])     "get it mostly q yr" (04/25/2014)  . Iron deficiency anemia   . GERD (gastroesophageal reflux disease)   . Migraine     "weekly, at least" (04/25/2014)  . Arthritis     "left hip" (04/25/2014)   Past Surgical History  Procedure Laterality Date  . Cholecystectomy    . Vsd repair  ~ 1963    [facility]/notes 04/25/2014   Social History:  reports that she has never smoked. She has never used smokeless tobacco. She reports that she does not drink alcohol or use illicit drugs. Where does patient live Home. Can patient participate  in ADLs? Yes.  No Known Allergies  Family History:  Family History  Problem Relation Age of Onset  . COPD Mother   . Hypertension Father   . Hyperlipidemia Father       Prior to Admission medications   Medication Sig Start Date End Date Taking? Authorizing Provider  busPIRone (BUSPAR) 10 MG tablet Take 10 mg by mouth 3 (three) times daily.   Yes Historical Provider, MD  FLUoxetine (PROZAC) 20 MG tablet Take 20 mg by mouth daily.   Yes Historical Provider, MD  metFORMIN (GLUCOPHAGE) 500 MG tablet Take 500 mg by mouth daily with breakfast. Reported on 07/10/2015   Yes Historical Provider, MD  nitroGLYCERIN (NITROSTAT) 0.4 MG SL tablet Place 1 tablet (0.4 mg total) under the tongue every 5 (five) minutes as needed for chest pain. 04/26/14  Yes Foley N Rumley, DO  simvastatin (ZOCOR) 10 MG tablet Take 10 mg by mouth daily.   Yes Historical Provider, MD  traZODone (DESYREL) 100 MG tablet Take 300 mg by mouth at bedtime.   Yes Historical Provider, MD    Physical Exam: Filed Vitals:   07/18/15 2126 07/18/15 2130 07/18/15 2200 07/18/15 2230  BP: 102/48 97/54 103/57 108/56  Pulse: 63 56 65 66  Temp:      TempSrc:      Resp: 14 18 21 20   SpO2: 98% 100% 100% 99%     General:  Moderately built and nourished.  Eyes: Anicteric no pallor.  ENT: No discharge  from the ears eyes nose or mouth.  Neck: No mass felt.  Cardiovascular: S1-S2 heard.  Respiratory: No rhonchi or crepitations.  Abdomen: Soft nontender bowel sounds present.  Skin: No rash.  Musculoskeletal: No edema.  Psychiatric: Appears normal.  Neurologic: Alert awake oriented to time place and person. Moves all extremities 5 x 5. No facial asymmetry tongue is midline PERRLA positive.  Labs on Admission:  Basic Metabolic Panel:  Recent Labs Lab 07/18/15 1719 07/18/15 1818  NA 140 140  K 4.0 3.9  CL 102 105  CO2  --  28  GLUCOSE 90 87  BUN 17 13  CREATININE 0.60 0.54  CALCIUM  --  9.7   Liver Function  Tests:  Recent Labs Lab 07/18/15 1818  AST 24  ALT 20  ALKPHOS 53  BILITOT 0.5  PROT 7.3  ALBUMIN 3.7   No results for input(s): LIPASE, AMYLASE in the last 168 hours. No results for input(s): AMMONIA in the last 168 hours. CBC:  Recent Labs Lab 07/18/15 1719 07/18/15 1818  WBC  --  8.4  NEUTROABS  --  4.6  HGB 13.9 11.6*  HCT 41.0 38.2  MCV  --  75.2*  PLT  --  250   Cardiac Enzymes: No results for input(s): CKTOTAL, CKMB, CKMBINDEX, TROPONINI in the last 168 hours.  BNP (last 3 results) No results for input(s): BNP in the last 8760 hours.  ProBNP (last 3 results) No results for input(s): PROBNP in the last 8760 hours.  CBG: No results for input(s): GLUCAP in the last 168 hours.  Radiological Exams on Admission: Dg Chest 2 View  07/18/2015  CLINICAL DATA:  Cough and left arm pain for 1 year. Hypertension. Chronic bronchitis. EXAM: CHEST  2 VIEW COMPARISON:  04/25/2014 FINDINGS: Lateral view degraded by patient arm position. Prior median sternotomy. Midline trachea. Mild cardiomegaly. Right-sided aortic arch. Mediastinal contours otherwise within normal limits. No pleural effusion or pneumothorax. No congestive failure. Clear lungs. IMPRESSION: Cardiomegaly without congestive failure. Right-sided aortic arch. Electronically Signed   By: Abigail Miyamoto M.D.   On: 07/18/2015 13:03   Mr Brain Wo Contrast  07/18/2015  CLINICAL DATA:  55 year old hypertensive female with high cholesterol and history of migraines. Prior VSD repair. Headache since last night. Visual loss for 1 minutes. Initial encounter. EXAM: MRI HEAD WITHOUT CONTRAST TECHNIQUE: Multiplanar, multiecho pulse sequences of the brain and surrounding structures were obtained without intravenous contrast. COMPARISON:  02/20/2010 CT. FINDINGS: No acute infarct or intracranial hemorrhage. No intracranial mass lesion noted on this unenhanced exam. Mild global atrophy without hydrocephalus. Cerebellar tonsils minimally low  lying but within the range of normal limits. Partially empty expanded sella without flattening of the posterior globes as seen with pseudotumor cerebri Major intracranial vascular structures are patent with small vertebral arteries and basilar artery. Atypical appearance of the semi circular canals incompletely assessed on present exam. Minimal mucosal thickening ethmoid sinus air cells. IMPRESSION: No acute infarct or intracranial hemorrhage. No intracranial mass lesion noted on this unenhanced exam. Mild global atrophy without hydrocephalus. Cerebellar tonsils minimally low lying but within the range of normal limits. Partially empty expanded sella without flattening of the posterior globes as seen with pseudotumor cerebri. Major intracranial vascular structures are patent with small vertebral arteries and basilar artery. Atypical appearance of the semi circular canals incompletely assessed on present exam. Electronically Signed   By: Genia Del M.D.   On: 07/18/2015 19:42    EKG: Independently reviewed. Normal sinus rhythm with RBBB.  Assessment/Plan Principal Problem:   TIA (transient ischemic attack) Active Problems:   Chest pain   Left shoulder pain   Diabetes mellitus type 2, controlled (HCC)   HLD (hyperlipidemia)   1. TIA - patient's transient blurred vision is concerning for TIA. Will check carotid Doppler 2-D echo and patient is on aspirin. Patient has failed swallow evaluation for which speech therapist has been consulted. Neuro checks. 2. Chest pain - given the cardiac risk factors including hyperlipidemia and diabetes we will cycle cardiac markers. Check 2-D echo. Aspirin. Cardiology consult requested. 3. Left shoulder and arm pain - has difficulty abducting left arm. Check x-rays. Cycle cardiac markers. 4. Diabetes mellitus type 2 - while inpatient and will hold metformin and place patient on sliding scale coverage. 5. Hyperlipidemia on statins.   DVT Prophylaxis Lovenox.   Code Status: Full code.  Family Communication: Discussed with patient.  Disposition Plan: Admit for observation.    KAKRAKANDY,ARSHAD N. [facility] Pager [phone].  If 7PM-7AM, please contact night-coverage [email or web address] Password [facility] 07/18/2015, 10:42 PM

## 2015-07-18 NOTE — ED Notes (Signed)
From home via [facility], reports 1 min loss of vision last night, also c/o left arm pain X 1 year and HA, VSS, CBG 124, A/O X4 and in NAD

## 2015-07-18 NOTE — Consult Note (Signed)
Neurology Consultation Reason for Consult: HA, dysphagia Referring Physician: Dr Tamera Punt  CC: HA, dysphagia, loss of vision  History is obtained from:patient  HPI: Stephanie Bray is a 55 y.o. female with a hx of HAs and prior spells of loss of vision and presyncopal symptoms.  Last night had 1 minute bialteral loss of vision that came on slowly on both eyes, with dyaphoresis as sensation of heat.  She denies palpitations.  There was no LOC.  She was driving at the time and she was able to pull over safely.  Denied diplopia dysarthria, numbness or weakness.  In the ED she was tested for dysphagia and she was unable to pass the swallow test.  She laso had developed a headache during the day that has resolved.  Her headache was not thiunderclap and was her regular type headache.   Interestingly in the ED she went into bigeminy, developed chest pain and left arm pain.  She is being admitted for observation.  She complains of cough. She denies fevers chills. Has some degree of SOB.  MRI in the ED was negative.   ROS: A 14 point ROS was performed and is negative except as noted in the HPI.  Past Medical History  Diagnosis Date  . Anxiety   . Depression   . Hypertension   . High cholesterol   . Heart murmur   . Chronic bronchitis ([place])     "get it mostly q yr" (04/25/2014)  . Iron deficiency anemia   . GERD (gastroesophageal reflux disease)   . Migraine     "weekly, at least" (04/25/2014)  . Arthritis     "left hip" (04/25/2014)     Family History  Problem Relation Age of Onset  . COPD Mother   . Hypertension Father   . Hyperlipidemia Father   hx of anxiety  Social History:  reports that she has never smoked. She has never used smokeless tobacco. She reports that she does not drink alcohol or use illicit drugs.  Exam: Current vital signs: BP 108/56 mmHg  Pulse 66  Temp(Src) 98.1 F (36.7 C) (Oral)  Resp 20  SpO2 99% Vital signs in last 24 hours: Temp:  [98.1 F (36.7 C)-98.2  F (36.8 C)] 98.1 F (36.7 C) (03/14 1629) Pulse Rate:  [56-77] 66 (03/14 2230) Resp:  [14-21] 20 (03/14 2230) BP: (97-143)/(42-72) 108/56 mmHg (03/14 2230) SpO2:  [85 %-100 %] 99 % (03/14 2230)   Physical Exam  Constitutional: Appears well-developed and well-nourished.  Psych: Affect appropriate to situation Eyes: No scleral injection HENT: No OP obstrucion Head: Normocephalic.  Cardiovascular: Normal rate and regular rhythm.  Respiratory: Effort normal and breath sounds normal to anterior ascultation GI: Soft.  No distension. There is no tenderness.  Skin: WDI  Neuro: Mental Status: Patient is awake, alert, oriented to person, place, month, year, and situation. Patient is able to give a clear and coherent history No signs of aphasia or neglect Cranial Nerves: II: Visual Fields are full. Pupils are equal, round, and reactive to light.  III,IV, VI: EOMI without ptosis or diploplia.  V: Facial sensation is symmetric to temperature VII: Facial movement is symmetric.  VIII: hearing is intact to voice X: Uvula elevates symmetrically XI: Shoulder shrug is symmetric. XII: tongue is midline without atrophy or fasciculations.  Motor: Tone is normal. Bulk is normal. 5/5 strength was present in all four extremities.  Sensory: Sensation is symmetric to light touch and temperature in the arms and legs. Deep Tendon  Reflexes: 2+ and symmetric in the biceps and patellae.  Plantars: Toes are downgoing bilaterally.  Cerebellar: FNF and HKS are intact bilaterally    I have reviewed labs in epic and the results pertinent to this consultation are:  I have reviewed the images obtained:  Impression: Headache, resolved.  Presyncopal spell last night - given her reported symptoms there might be a cardiogenic cause.  Might need ABG, troponin and telemetry monitor while admitted and consider a loop recorder on discharge.  Her MRI is negative and her sx apepar to have a syst emic, cardiogenic  cause.  Regarding her dysphagia, this is a relatively common sx on pt's with anxiety.  NPO for now and retest in am.  I do not believe her sx have a primary neurological cause for the time being.  Neurology will follow up tomorrow and further decisions should be made at that time.  Recommendations: as above

## 2015-07-18 NOTE — ED Provider Notes (Signed)
CSN: [id number]     Arrival date & time 07/18/15  1051 History   First MD Initiated Contact with Patient 07/18/15 1220     Chief Complaint  Patient presents with  . Headache     (Consider location/radiation/quality/duration/timing/severity/associated sxs/prior Treatment) HPI She has had diffuse headache since last night. Last night while driving 8 p.m. she developed loss of vision for 1 minute which resolved spontaneously. No treatment prior to coming here. Other associated symptoms include cough for 1 year and left arm pain, for 1 year. Left arm pain is worse with moving her arm improved with remaining still. She's been treated for her cough with medication which she does not recall from her primary care physician Dr. Jonni Sanger. No other associated symptoms Past Medical History  Diagnosis Date  . Anxiety   . Depression   . Hypertension   . High cholesterol   . Heart murmur   . Chronic bronchitis ([place])     "get it mostly q yr" (04/25/2014)  . Iron deficiency anemia   . GERD (gastroesophageal reflux disease)   . Migraine     "weekly, at least" (04/25/2014)  . Arthritis     "left hip" (04/25/2014)   Past Surgical History  Procedure Laterality Date  . Cholecystectomy    . Vsd repair  ~ 1963    [facility]/notes 04/25/2014   Family History  Problem Relation Age of Onset  . COPD Mother   . Hypertension Father   . Hyperlipidemia Father    Social History  Substance Use Topics  . Smoking status: Never Smoker   . Smokeless tobacco: Never Used  . Alcohol Use: No   OB History    No data available     Review of Systems  Constitutional: Negative.   Respiratory: Positive for cough.   Cardiovascular: Negative.   Gastrointestinal: Negative.   Musculoskeletal: Positive for myalgias.       Left arm pain  Skin: Negative.   Neurological: Positive for headaches.  Psychiatric/Behavioral: Negative.   All other systems reviewed and are negative.     Allergies  Review of patient's  allergies indicates no known allergies.  Home Medications   Prior to Admission medications   Medication Sig Start Date End Date Taking? Authorizing Provider  busPIRone (BUSPAR) 10 MG tablet Take 10 mg by mouth 3 (three) times daily.   Yes Historical Provider, MD  FLUoxetine (PROZAC) 20 MG tablet Take 20 mg by mouth daily.   Yes Historical Provider, MD  metFORMIN (GLUCOPHAGE) 500 MG tablet Take 500 mg by mouth daily with breakfast. Reported on 07/10/2015   Yes Historical Provider, MD  nitroGLYCERIN (NITROSTAT) 0.4 MG SL tablet Place 1 tablet (0.4 mg total) under the tongue every 5 (five) minutes as needed for chest pain. 04/26/14  Yes Paulden N Rumley, DO  simvastatin (ZOCOR) 10 MG tablet Take 10 mg by mouth daily.   Yes Historical Provider, MD  traZODone (DESYREL) 100 MG tablet Take 300 mg by mouth at bedtime.   Yes Historical Provider, MD   BP 143/72 mmHg  Pulse 77  Temp(Src) 98.2 F (36.8 C) (Oral)  Resp 16  SpO2 95% Physical Exam  Constitutional: She is oriented to person, place, and time. She appears well-developed and well-nourished.  HENT:  Head: Normocephalic and atraumatic.  No facial asymmetry  Eyes: Conjunctivae are normal. Pupils are equal, round, and reactive to light.  Neck: Neck supple. No tracheal deviation present. No thyromegaly present.  Cardiovascular: Normal rate and regular rhythm.  Murmur heard. Grade 2/6 systolic murmur  Pulmonary/Chest: Effort normal and breath sounds normal.  Abdominal: Soft. Bowel sounds are normal. She exhibits no distension. There is no tenderness.  Musculoskeletal: Normal range of motion. She exhibits no edema or tenderness.  *Extremity minimally tender over bicep area no crepitance no redness no swelling. Radial pulse 2+ bilaterally  Neurological: She is alert and oriented to person, place, and time. She has normal reflexes. Coordination normal.  Gait normal Romberg normal pronator drift normal finger to nose normal. DTRs symmetric  bilaterally at knee jerk ankle jerk and biceps  Skin: Skin is warm and dry. No rash noted.  Psychiatric: She has a normal mood and affect.  Nursing note and vitals reviewed.   ED Course  Procedures (including critical care time) Labs Review Labs Reviewed - No data to display  Imaging Review No results found. I have personally reviewed and evaluated these images and lab results as part of my medical decision-making.   EKG Interpretation None      MDM  Patient failed swallow screen. I ordered stroke workup in light of patient's failing swallow screen. MRI is pending. Patient is signed out to [facility] 5 PM neurology consult pending. Intravenous Morphine ordered in light of continued headache Final diagnoses:  None   Diagnosis #1 transient blindness #2 headache #3 dysphagia     Orlie Dakin, MD 07/18/15 [phone]

## 2015-07-18 NOTE — ED Provider Notes (Addendum)
Care taken over from Dr. Winfred Leeds.  Pt had headache yesterday with complete vision loss lasting about one minute last night.  Intermittent blurry vision since then.  No current neuro deficits, but has failed swallow screen in the ED. MR pending.  I spoke with Dr. Silverio Decamp who will see pt.  21:10 MR neg, pt developed some CP during MRI, EKG shows transient bigeminy. She's had a subsequent EKG which showed a sinus rhythm. I don't see any acute ischemic changes. She does have a history of VSD repair as an infant. She's had a second troponin which is negative. Dr. Wendee Beavers with neurology has seen the patient and doesn't feel that this is a stroke. This could represent a near syncopal type episode given the vision changes which was also associated with nausea and diaphoresis. I feel that we prudent to admit the patient for observation and further evaluation.  I spoke with Dr. Hal Hope who will admit pt to telemetry.  Malvin Johns, MD 07/18/15 [place], MD 07/18/15 2138

## 2015-07-18 NOTE — ED Notes (Signed)
PT requests medicine for headache 8/10. Dr. Cathleen Fears made aware that PT failed swallow screen

## 2015-07-18 NOTE — ED Notes (Signed)
PT remains in MRI at this time.  

## 2015-07-19 ENCOUNTER — Observation Stay (HOSPITAL_BASED_OUTPATIENT_CLINIC_OR_DEPARTMENT_OTHER): Payer: Medicare Other

## 2015-07-19 ENCOUNTER — Encounter (HOSPITAL_COMMUNITY): Payer: Self-pay | Admitting: Nurse Practitioner

## 2015-07-19 DIAGNOSIS — G459 Transient cerebral ischemic attack, unspecified: Secondary | ICD-10-CM

## 2015-07-19 DIAGNOSIS — R0789 Other chest pain: Secondary | ICD-10-CM | POA: Diagnosis not present

## 2015-07-19 DIAGNOSIS — G43711 Chronic migraine without aura, intractable, with status migrainosus: Secondary | ICD-10-CM | POA: Diagnosis not present

## 2015-07-19 DIAGNOSIS — E119 Type 2 diabetes mellitus without complications: Secondary | ICD-10-CM

## 2015-07-19 DIAGNOSIS — H53133 Sudden visual loss, bilateral: Secondary | ICD-10-CM

## 2015-07-19 DIAGNOSIS — R51 Headache: Secondary | ICD-10-CM

## 2015-07-19 DIAGNOSIS — R519 Headache, unspecified: Secondary | ICD-10-CM | POA: Insufficient documentation

## 2015-07-19 DIAGNOSIS — E785 Hyperlipidemia, unspecified: Secondary | ICD-10-CM | POA: Diagnosis not present

## 2015-07-19 DIAGNOSIS — Q210 Ventricular septal defect: Secondary | ICD-10-CM

## 2015-07-19 DIAGNOSIS — H538 Other visual disturbances: Secondary | ICD-10-CM | POA: Insufficient documentation

## 2015-07-19 DIAGNOSIS — R55 Syncope and collapse: Secondary | ICD-10-CM

## 2015-07-19 DIAGNOSIS — R072 Precordial pain: Secondary | ICD-10-CM

## 2015-07-19 LAB — CBC
HCT: 33.1 % — ABNORMAL LOW (ref 36.0–46.0)
HEMATOCRIT: 34.3 % — AB (ref 36.0–46.0)
HEMOGLOBIN: 10.4 g/dL — AB (ref 12.0–15.0)
HEMOGLOBIN: 10.4 g/dL — AB (ref 12.0–15.0)
MCH: 23 pg — ABNORMAL LOW (ref 26.0–34.0)
MCH: 23.8 pg — AB (ref 26.0–34.0)
MCHC: 30.3 g/dL (ref 30.0–36.0)
MCHC: 31.4 g/dL (ref 30.0–36.0)
MCV: 75.7 fL — AB (ref 78.0–100.0)
MCV: 75.7 fL — AB (ref 78.0–100.0)
PLATELETS: 231 10*3/uL (ref 150–400)
PLATELETS: 214 10*3/uL (ref 150–400)
RBC: 4.53 MIL/uL (ref 3.87–5.11)
RBC: 4.37 MIL/uL (ref 3.87–5.11)
RDW: 16 % — ABNORMAL HIGH (ref 11.5–15.5)
RDW: 16 % — ABNORMAL HIGH (ref 11.5–15.5)
WBC: 9.1 10*3/uL (ref 4.0–10.5)
WBC: 8.1 10*3/uL (ref 4.0–10.5)

## 2015-07-19 LAB — LIPID PANEL
CHOLESTEROL: 226 mg/dL — AB (ref 0–200)
HDL: 46 mg/dL (ref >40)
LDL Cholesterol: 170 mg/dL — ABNORMAL HIGH (ref 0–99)
TRIGLYCERIDES: 50 mg/dL (ref <150)
Total CHOL/HDL Ratio: 4.9 RATIO
VLDL: 10 mg/dL (ref 0–40)

## 2015-07-19 LAB — GLUCOSE, CAPILLARY
GLUCOSE-CAPILLARY: 100 mg/dL — AB (ref 65–99)
GLUCOSE-CAPILLARY: 78 mg/dL (ref 65–99)
GLUCOSE-CAPILLARY: 139 mg/dL — AB (ref 65–99)
Glucose-Capillary: 154 mg/dL — ABNORMAL HIGH (ref 65–99)
Glucose-Capillary: 186 mg/dL — ABNORMAL HIGH (ref 65–99)

## 2015-07-19 LAB — TROPONIN I: Troponin I: <0.03 ng/mL (ref <0.031)

## 2015-07-19 LAB — COMPREHENSIVE METABOLIC PANEL
ALBUMIN: 3.1 g/dL — AB (ref 3.5–5.0)
ALT: 29 U/L (ref 14–54)
AST: 36 U/L (ref 15–41)
Alkaline Phosphatase: 51 U/L (ref 38–126)
Anion gap: 7 (ref 5–15)
BUN: 12 mg/dL (ref 6–20)
CHLORIDE: 107 mmol/L (ref 101–111)
CO2: 27 mmol/L (ref 22–32)
CREATININE: 0.48 mg/dL (ref 0.44–1.00)
Calcium: 8.8 mg/dL — ABNORMAL LOW (ref 8.9–10.3)
GFR calc non Af Amer: >60 mL/min (ref >60)
GLUCOSE: 92 mg/dL (ref 65–99)
Potassium: 4 mmol/L (ref 3.5–5.1)
SODIUM: 141 mmol/L (ref 135–145)
Total Bilirubin: 0.6 mg/dL (ref 0.3–1.2)
Total Protein: 5.9 g/dL — ABNORMAL LOW (ref 6.5–8.1)

## 2015-07-19 LAB — ECHOCARDIOGRAM COMPLETE
Height: 58 in
Weight: 2772.5 oz

## 2015-07-19 LAB — CREATININE, SERUM
Creatinine, Ser: 0.59 mg/dL (ref 0.44–1.00)
GFR calc non Af Amer: >60 mL/min (ref >60)

## 2015-07-19 LAB — MAGNESIUM: MAGNESIUM: 2 mg/dL (ref 1.7–2.4)

## 2015-07-19 MED ORDER — METHYLPREDNISOLONE SODIUM SUCC 125 MG IJ SOLR
125.0000 mg | Freq: Four times a day (QID) | INTRAMUSCULAR | Status: DC
  Administered 2015-07-19 – 2015-07-20 (×5): 125 mg via INTRAVENOUS
  Filled 2015-07-19 (×5): qty 2

## 2015-07-19 MED ORDER — INSULIN ASPART 100 UNIT/ML ~~LOC~~ SOLN
0.0000 [IU] | Freq: Three times a day (TID) | SUBCUTANEOUS | Status: DC
  Administered 2015-07-19: 1 [IU] via SUBCUTANEOUS
  Administered 2015-07-19: 2 [IU] via SUBCUTANEOUS
  Administered 2015-07-20: 1 [IU] via SUBCUTANEOUS
  Administered 2015-07-20: 3 [IU] via SUBCUTANEOUS

## 2015-07-19 MED ORDER — ASPIRIN 325 MG PO TABS
325.0000 mg | ORAL_TABLET | Freq: Every day | ORAL | Status: DC
  Administered 2015-07-19 – 2015-07-20 (×2): 325 mg via ORAL
  Filled 2015-07-19 (×2): qty 1

## 2015-07-19 MED ORDER — ENOXAPARIN SODIUM 40 MG/0.4ML ~~LOC~~ SOLN
40.0000 mg | SUBCUTANEOUS | Status: DC
  Administered 2015-07-19 – 2015-07-20 (×2): 40 mg via SUBCUTANEOUS
  Filled 2015-07-19 (×2): qty 0.4

## 2015-07-19 MED ORDER — ACETAMINOPHEN 325 MG PO TABS
ORAL_TABLET | ORAL | Status: AC
  Administered 2015-07-19: 650 mg
  Filled 2015-07-19: qty 2

## 2015-07-19 MED ORDER — VALPROATE SODIUM 500 MG/5ML IV SOLN
250.0000 mg | Freq: Four times a day (QID) | INTRAVENOUS | Status: DC
  Administered 2015-07-19 – 2015-07-20 (×5): 250 mg via INTRAVENOUS
  Filled 2015-07-19 (×8): qty 2.5

## 2015-07-19 MED ORDER — ACETAMINOPHEN 325 MG PO TABS: 650.0000 mg | ORAL_TABLET | Freq: Four times a day (QID) | ORAL | Status: DC | PRN

## 2015-07-19 MED ORDER — PANTOPRAZOLE SODIUM 40 MG IV SOLR
40.0000 mg | INTRAVENOUS | Status: DC
  Administered 2015-07-19 – 2015-07-20 (×2): 40 mg via INTRAVENOUS
  Filled 2015-07-19 (×3): qty 40

## 2015-07-19 MED ORDER — ZOLPIDEM TARTRATE 5 MG PO TABS
5.0000 mg | ORAL_TABLET | Freq: Every evening | ORAL | Status: DC | PRN
  Administered 2015-07-20: 5 mg via ORAL
  Filled 2015-07-19: qty 1

## 2015-07-19 MED ORDER — ONDANSETRON HCL 4 MG/2ML IJ SOLN
4.0000 mg | Freq: Four times a day (QID) | INTRAMUSCULAR | Status: DC | PRN
  Administered 2015-07-19: 4 mg via INTRAVENOUS
  Filled 2015-07-19: qty 2

## 2015-07-19 MED ORDER — BACLOFEN 10 MG PO TABS
10.0000 mg | ORAL_TABLET | Freq: Every day | ORAL | Status: DC
  Administered 2015-07-19: 10 mg via ORAL
  Filled 2015-07-19: qty 1

## 2015-07-19 MED ORDER — FAMOTIDINE 20 MG PO TABS
20.0000 mg | ORAL_TABLET | Freq: Two times a day (BID) | ORAL | Status: DC
  Administered 2015-07-19 – 2015-07-20 (×3): 20 mg via ORAL
  Filled 2015-07-19 (×3): qty 1

## 2015-07-19 MED ORDER — NITROGLYCERIN 0.4 MG SL SUBL: 0.4000 mg | SUBLINGUAL_TABLET | SUBLINGUAL | Status: DC | PRN

## 2015-07-19 MED ORDER — ASPIRIN 300 MG RE SUPP: 300.0000 mg | Freq: Every day | RECTAL | Status: DC

## 2015-07-19 MED ORDER — DEXTROSE 5 % IV SOLN
500.0000 mg | INTRAVENOUS | Status: AC
  Administered 2015-07-19: 500 mg via INTRAVENOUS
  Filled 2015-07-19: qty 5

## 2015-07-19 MED ORDER — BACLOFEN 10 MG PO TABS
5.0000 mg | ORAL_TABLET | Freq: Two times a day (BID) | ORAL | Status: DC
  Administered 2015-07-19 – 2015-07-20 (×3): 5 mg via ORAL
  Filled 2015-07-19 (×3): qty 1

## 2015-07-19 MED ORDER — SODIUM CHLORIDE 0.9 % IV SOLN
INTRAVENOUS | Status: AC
  Administered 2015-07-19: 02:00:00 via INTRAVENOUS

## 2015-07-19 NOTE — Progress Notes (Signed)
*  PRELIMINARY RESULTS* Vascular Ultrasound Carotid Duplex (Doppler) has been completed.  Preliminary findings: Bilateral: No significant (1-39%) ICA stenosis. Antegrade vertebral flow.    Landry Mellow, RDMS, RVT  07/19/2015, 2:15 PM

## 2015-07-19 NOTE — Progress Notes (Signed)
  Echocardiogram 2D Echocardiogram has been performed.  Diamond Nickel 07/19/2015, 3:05 PM

## 2015-07-19 NOTE — Care Management Obs Status (Signed)
[facility] NOTIFICATION   Patient Details  Name: Stephanie Bray MRN: [id number] Date of Birth: Dec 10, 1960   Medicare Observation Status Notification Given:  Yes (MRI negative)    Pollie Friar, RN 07/19/2015, 11:44 AM

## 2015-07-19 NOTE — Evaluation (Addendum)
Speech Language Evaluation  Patient Details  Name: AMAANI HAROLDSEN MRN: [id number] Date of Birth: [birth year]  Today's Date: 07/19/2015 Time: SLP Start Time (ACUTE ONLY): 0822 SLP Stop Time (ACUTE ONLY): 0850 SLP Time Calculation (min) (ACUTE ONLY): 28 min  Past Medical History:  Past Medical History  Diagnosis Date  . Anxiety   . Depression   . Hypertension   . High cholesterol   . Heart murmur   . Chronic bronchitis ([place])     "get it mostly q yr" (04/25/2014)  . Iron deficiency anemia   . GERD (gastroesophageal reflux disease)   . Migraine     "weekly, at least" (04/25/2014)  . Arthritis     "left hip" (04/25/2014)   Past Surgical History:  Past Surgical History  Procedure Laterality Date  . Cholecystectomy    . Vsd repair  ~ 1963    [facility]/notes 04/25/2014   HPI:  55 yo female adm to [facility] with dizziness and headache.  PMH +for HTN, DM, GERD, migraines.  Pt MRI negative, CXR showed cardiomegaly, right sided aortic arch, otherwise negative.  Swallow and speech evaluation ordered.    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  She scored 22/30 on Montreal Cognitive Assessment [facility] - with areas of strengths including naming, attention, orientation and clock drawing.    Areas of difficulties include recall (retrieval), mental math and visuospatial/executive function.  Pt reports her son manages their finances at home and she plans to obtain medication Sun-Sat container to help with organization.  SLP encouraged her to continue these strategies using teach back.   As pt appears to have support needed at home and shows good functional skills (stating she is diabetic and needs to monitor carbs, using phone to call for RN pain medication per SLP instruction and recalling later), no follow up indicated re: cognitive linguistic function.  Provided her with  a list of memory compensation strategies.    Thanks for this consult.      Aspiration Risk  Mild aspiration risk    Diet Recommendation Regular;Thin liquid   Liquid Administration via: Cup;Straw Medication Administration: Whole meds with liquid Supervision: Patient able to self feed Compensations: Slow rate;Small sips/bites Postural Changes: Seated upright at 90 degrees;Remain upright for at least 30 minutes after po intake    Other  Recommendations Oral Care Recommendations: Oral care BID   Follow up Recommendations  None    Frequency and Duration min 1 x/week  1 week       Prognosis Prognosis for Safe Diet Advancement: Good      Swallow Study   General HPI: 55 yo female adm to [facility] with dizziness and headache.  PMH +for HTN, DM, GERD, migraines.  Pt MRI negative, CXR showed cardiomegaly, right sided aortic arch, otherwise negative.  Swallow and speech evaluation ordered.  Type of Study: Bedside Swallow Evaluation Diet Prior to this Study: NPO Temperature Spikes Noted: No Respiratory Status: Nasal cannula (2 liters) History of Recent Intubation: No Behavior/Cognition: Alert;Cooperative;Pleasant mood Oral Cavity Assessment: Within Functional Limits Oral Care Completed by SLP: Yes Oral Cavity - Dentition: Dentures, top;Dentures, bottom (pt does not have adhesive and therefore dentures were cleaned by slp but pt did not use them) Vision: Functional for self-feeding Self-Feeding Abilities: Able to feed self Patient Positioning: Upright in bed Baseline Vocal Quality: Normal Volitional Cough: Strong Volitional Swallow: Able to elicit  Oral/Motor/Sensory Function Overall Oral Motor/Sensory Function: Mild impairment Facial ROM: Reduced left;Suspected CN VII (facial) dysfunction Facial Symmetry: Abnormal symmetry left;Suspected CN VII (facial) dysfunction Facial Strength: Reduced left;Suspected CN VII (facial) dysfunction Facial Sensation: Reduced left;Suspected CN V  (Trigeminal) dysfunction Lingual ROM: Within Functional Limits Lingual Symmetry: Within Functional Limits Lingual Strength: Within Functional Limits Lingual Sensation: Within Functional Limits Velum: Within Functional Limits Mandible: Within Functional Limits   Ice Chips Ice chips: Not tested   Thin Liquid Thin Liquid: Within functional limits Presentation: Cup;Straw;Self Fed    Nectar Thick Nectar Thick Liquid: Not tested   Honey Thick Honey Thick Liquid: Not tested   Puree Puree: Within functional limits Presentation: Self Fed;Spoon   Solid   GO   Solid: Within functional limits Presentation: Self Fed Other Comments: slow mastication but no oral residuals apparent    Functional Assessment Tool Used: clinical judgement Functional Limitations: Swallowing Swallow Current Status [id number]): At least 1 percent but less than 20 percent impaired, limited or restricted Swallow Goal Status [phone]): At least 1 percent but less than 20 percent impaired, limited or restricted   Claudie Fisherman, [place] [facility] SLP [phone]

## 2015-07-19 NOTE — Evaluation (Signed)
Clinical/Bedside Swallow Evaluation Patient Details  Name: KRYSTN SOLANO MRN: [id number] Date of Birth: [birth year]  Today's Date: 07/19/2015 Time: SLP Start Time (ACUTE ONLY): 0805 SLP Stop Time (ACUTE ONLY): 0821 SLP Time Calculation (min) (ACUTE ONLY): 16 min  Past Medical History:  Past Medical History  Diagnosis Date  . Anxiety   . Depression   . Hypertension   . High cholesterol   . Heart murmur   . Chronic bronchitis ([place])     "get it mostly q yr" (04/25/2014)  . Iron deficiency anemia   . GERD (gastroesophageal reflux disease)   . Migraine     "weekly, at least" (04/25/2014)  . Arthritis     "left hip" (04/25/2014)   Past Surgical History:  Past Surgical History  Procedure Laterality Date  . Cholecystectomy    . Vsd repair  ~ 1963    [facility]/notes 04/25/2014   HPI:  55 yo female adm to [facility] with dizziness and headache.  PMH +for HTN, DM, GERD, migraines.  Pt MRI negative, CXR showed cardiomegaly, right sided aortic arch, otherwise negative.  Swallow and speech evaluation ordered.    Assessment / Plan / Recommendation Clinical Impression  Pt presents with decreased sensation and minimal decreased motor movement - left labial/facial resulting in minimal anterior labial spillage and labial yogurt residuals but no intraoral residuals.  Pt admits to occasional "choking" on liquids and when lying down = has h/o GERD.    Dentures present but pt does not have adhesive- therefore they were not used during evaluation.  Since she uses dentures for eating, advised her to have her family bring in adhesive - she assured SLP she will do so.      Given pt with occasional choking and facial/trigeminal cranial nerve deficits, will follow up x1 to assure tolerance and education completed.     Aspiration Risk  Mild aspiration risk    Diet Recommendation Regular;Thin liquid   Liquid Administration via: Cup;Straw Medication Administration: Whole meds with liquid Supervision: Patient  able to self feed Compensations: Slow rate;Small sips/bites Postural Changes: Seated upright at 90 degrees;Remain upright for at least 30 minutes after po intake    Other  Recommendations Oral Care Recommendations: Oral care BID   Follow up Recommendations    likely no post=acute follow up   Frequency and Duration min 1 x/week  1 week       Prognosis Prognosis for Safe Diet Advancement: Good      Swallow Study   General HPI: 55 yo female adm to [facility] with dizziness and headache.  PMH +for HTN, DM, GERD, migraines.  Pt MRI negative, CXR showed cardiomegaly, right sided aortic arch, otherwise negative.  Swallow and speech evaluation ordered.  Type of Study: Bedside Swallow Evaluation Diet Prior to this Study: NPO Temperature Spikes Noted: No Respiratory Status: Nasal cannula (2 liters) History of Recent Intubation: No Behavior/Cognition: Alert;Cooperative;Pleasant mood Oral Cavity Assessment: Within Functional Limits Oral Care Completed by SLP: Yes Oral Cavity - Dentition: Dentures, top;Dentures, bottom (pt does not have adhesive and therefore dentures were cleaned by slp but pt did not use them) Vision: Functional for self-feeding Self-Feeding Abilities: Able to feed self Patient Positioning: Upright in bed Baseline Vocal Quality: Normal Volitional Cough: Strong Volitional Swallow: Able to elicit    Oral/Motor/Sensory Function Overall Oral Motor/Sensory Function: Mild impairment Facial ROM: Reduced left;Suspected CN VII (facial) dysfunction Facial Symmetry: Abnormal symmetry left;Suspected CN VII (facial) dysfunction Facial Strength: Reduced left;Suspected CN VII (facial) dysfunction Facial Sensation: Reduced  left;Suspected CN V (Trigeminal) dysfunction Lingual ROM: Within Functional Limits Lingual Symmetry: Within Functional Limits Lingual Strength: Within Functional Limits Lingual Sensation: Within Functional Limits Velum: Within Functional Limits Mandible: Within  Functional Limits   Ice Chips Ice chips: Not tested   Thin Liquid Thin Liquid: Within functional limits Presentation: Cup;Straw;Self Fed    Nectar Thick Nectar Thick Liquid: Not tested   Honey Thick Honey Thick Liquid: Not tested   Puree Puree: Within functional limits Presentation: Self Fed;Spoon   Solid   GO   Solid: Within functional limits Presentation: Self Fed Other Comments: slow mastication but no oral residuals apparent    Functional Assessment Tool Used: clinical judgement Functional Limitations: Swallowing Swallow Current Status [id number]): At least 1 percent but less than 20 percent impaired, limited or restricted Swallow Goal Status [phone]): At least 1 percent but less than 20 percent impaired, limited or restricted   Luanna Salk, [place] [facility] SLP [phone]

## 2015-07-19 NOTE — Evaluation (Signed)
Occupational Therapy Evaluation and Discharge Patient Details Name: Stephanie Bray MRN: [id number] DOB: Jul 13, 1960 Today's Date: 07/19/2015    History of Present Illness presented with vision changes (varied no vision to blurry) with MRI negative, +HA PMHx-DM, migraines, anxiety/depression, Lt THA   Clinical Impression   Pt admitted with above.  Completed bed mobility, toilet and tub/shower transfers, and doffed/donned socks with increased time demonstrating good safety awareness with mobility.  Pt reports her sons are very supportive and can assist as needed.  No further OT needs identified, pt d/c from OT services.    Follow Up Recommendations  No OT follow up    Equipment Recommendations  None recommended by OT    Recommendations for Other Services       Precautions / Restrictions Precautions Precautions: Fall      Mobility Bed Mobility Overal bed mobility: Independent                Transfers Overall transfer level: Independent Equipment used: None                       ADL Overall ADL's : At baseline                                       General ADL Comments: Doffed and donned socks with increased time, reports occasionally her son would assist with socks but mostly just wears slip on shoes.  Completed tub/shower transfer in rehab room without assist stabilizing against wall with hand when stepping over tub ledge               Pertinent Vitals/Pain Pain Assessment: No/denies pain     Hand Dominance Right   Extremity/Trunk Assessment Upper Extremity Assessment Upper Extremity Assessment: Overall WFL for tasks assessed   Lower Extremity Assessment Lower Extremity Assessment: Defer to PT evaluation       Communication Communication Communication: No difficulties   Cognition Arousal/Alertness: Awake/alert Behavior During Therapy: WFL for tasks assessed/performed Overall Cognitive Status: Within Functional Limits for  tasks assessed                                Home Living Family/patient expects to be discharged to:: Private residence Living Arrangements: Children Available Help at Discharge: Family;Available 24 hours/day Type of Home: House Home Access: Stairs to enter [employer] of Steps: 4 Entrance Stairs-Rails: Left Home Layout: Two level;Laundry or work area in Building surveyor of Steps: flight, does not have to go downstairs to basement able to live on main floor   Bathroom Shower/Tub: Tub/shower unit;Curtain     Bathroom Accessibility: Yes   Home Equipment: None      Lives With: Family    Prior Functioning/Environment Level of Independence: Needs assistance  Gait / Transfers Assistance Needed: lately off-balance so she has slowed down her walking and balance has gotten worse; near falls when she bends down to pick items up from floor ADL's / Homemaking Assistance Needed: sons do laundry, occasionally requires assist with donning socks        OT Diagnosis: Generalized weakness         OT Goals(Current goals can be found in the care plan section) Acute Rehab OT Goals Patient Stated Goal: to go home tomorrow OT Goal Formulation: All assessment and education complete, DC therapy  End of Session Nurse Communication: Mobility status  Activity Tolerance: Patient tolerated treatment well;No increased pain Patient left: in bed;with nursing/sitter in room   Time: 1520-1531 OT Time Calculation (min): 11 min Charges:  OT General Charges $OT Visit: 1 Procedure OT Evaluation $OT Eval Low Complexity: 1 Procedure G-Codes: OT G-codes **NOT FOR INPATIENT CLASS** Functional Assessment Tool Used: clinical judgement Functional Limitation: Self care Self Care Current Status [id number]): At least 1 percent but less than 20 percent impaired, limited or restricted Self Care Goal Status [id number]): At least 1 percent but less than 20 percent  impaired, limited or restricted Self Care Discharge Status [phone]): At least 1 percent but less than 20 percent impaired, limited or restricted  Simonne Come, [id number] 07/19/2015, 3:53 PM
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
## 2015-07-19 NOTE — Progress Notes (Signed)
Received pt from ED with c/o blurred vision/ headache.

## 2015-07-19 NOTE — Progress Notes (Signed)
   07/19/15 2004  Clinical Encounter Type  Visited With Patient;Health care provider  Visit Type Initial;Spiritual support  Referral From Nurse  Spiritual Encounters  Spiritual Needs Sacred text;Prayer;Emotional;Grief support  Stress Factors  Patient Stress Factors Financial concerns;Health changes  Family Stress Factors None identified   Chaplain responded to Pt request for a chaplain visit.  Chaplain offered emotional support and spiritual support following a series of difficult life circumstances.  Chaplain empathetic listening, prayer and scriptural texts for pt.  Chaplain is available if desired for further support.  [employer], Chaplain

## 2015-07-19 NOTE — Care Management Note (Signed)
Case Management Note  Patient Details  Name: Stephanie Bray MRN: [id number] Date of Birth: 27-Jul-1960  Subjective/Objective:                    Action/Plan: Patient presented with blurred vision.  Lives at home with adult children. Will follow for discharge needs pending PT/OT evals and physician orders.  Expected Discharge Date:                  Expected Discharge Plan:     In-House Referral:     Discharge planning Services     Post Acute Care Choice:    Choice offered to:     DME Arranged:    DME Agency:     HH Arranged:    HH Agency:     Status of Service:  In process, will continue to follow  Medicare Important Message Given:    Date Medicare IM Given:    Medicare IM give by:    Date Additional Medicare IM Given:    Additional Medicare Important Message give by:     If discussed at B and E of Stay Meetings, dates discussed:    Additional Comments:  Rolm Baptise, RN 07/19/2015, 11:51 AM [phone]

## 2015-07-19 NOTE — Progress Notes (Signed)
Interval History:                                                                                                                      Stephanie Bray is an 55 y.o. female patient presenting with HA. Over night the HA resolved but this AM she states it is a 8/10. Patient is resting comfortably, watching TV, in no distress.    Past Medical History: Past Medical History  Diagnosis Date  . Anxiety   . Depression   . Hypertension   . High cholesterol   . Heart murmur   . Chronic bronchitis ([place])     "get it mostly q yr" (04/25/2014)  . Iron deficiency anemia   . GERD (gastroesophageal reflux disease)   . Migraine     "weekly, at least" (04/25/2014)  . Arthritis     "left hip" (04/25/2014)    Past Surgical History  Procedure Laterality Date  . Cholecystectomy    . Vsd repair  ~ 1963    [facility]/notes 04/25/2014    Family History: Family History  Problem Relation Age of Onset  . COPD Mother   . Hypertension Father   . Hyperlipidemia Father     Social History:   reports that she has never smoked. She has never used smokeless tobacco. She reports that she does not drink alcohol or use illicit drugs.  Allergies:  No Known Allergies   Medications:                                                                                                                         Current facility-administered medications:  .  0.9 %  sodium chloride infusion, , Intravenous, Continuous, Rise Patience, MD, Last Rate: 75 mL/hr at 07/19/15 0220 .  acetaminophen (TYLENOL) tablet 650 mg, 650 mg, Oral, Q6H PRN, Verlee Monte, MD, 650 mg at 07/19/15 0847 .  aspirin suppository 300 mg, 300 mg, Rectal, Daily **OR** aspirin tablet 325 mg, 325 mg, Oral, Daily, Rise Patience, MD .  enoxaparin (LOVENOX) injection 40 mg, 40 mg, Subcutaneous, Q24H, Rise Patience, MD .  insulin aspart (novoLOG) injection 0-9 Units, 0-9 Units, Subcutaneous, TID WC, Rise Patience, MD, 0 Units at 07/19/15  0849 .  nitroGLYCERIN (NITROSTAT) SL tablet 0.4 mg, 0.4 mg, Sublingual, Q5 min PRN, Rise Patience, MD .  ondansetron [facility]) injection 4 mg, 4 mg, Intravenous, Q6H PRN, Rhetta Mura Schorr, NP, 4 mg at 07/19/15 0220 .  pantoprazole (PROTONIX) injection 40 mg, 40 mg, Intravenous, Q24H, Rise Patience, MD   Neurologic Examination:                                                                                                     Today's Vitals   07/19/15 0505 07/19/15 0700 07/19/15 0850 07/19/15 0851  BP: 98/55 122/52  115/63  Pulse: 51 64  59  Temp: 98.2 F (36.8 C) 97.9 F (36.6 C)  97.9 F (36.6 C)  TempSrc: Oral Oral  Oral  Resp: 20 18  18   Height:      Weight:      SpO2: 99% 99%  99%  PainSc:   8      Evaluation of higher integrative functions including: Level of alertness: Alert,  Oriented to time, place and person Speech: fluent, no evidence of dysarthria or aphasia noted.  Test the following cranial nerves: 2-12 grossly intact Motor examination: Normal tone, bulk, full 5/5 motor strength in all 4 extremities Examination of sensation : Normal and symmetric sensation to pinprick in all 4 extremities and on face Examination of deep tendon reflexes: 2+, normal and symmetric in all extremities, normal plantars bilaterally Test coordination: Normal finger nose testing, with no evidence of limb appendicular ataxia or abnormal involuntary movements or tremors noted.  Gait: Deferred   Lab Results: Basic Metabolic Panel:  Recent Labs Lab 07/18/15 1719 07/18/15 1818 07/19/15 0114 07/19/15 0541  NA 140 140  --  141  K 4.0 3.9  --  4.0  CL 102 105  --  107  CO2  --  28  --  27  GLUCOSE 90 87  --  92  BUN 17 13  --  12  CREATININE 0.60 0.54 0.59 0.48  CALCIUM  --  9.7  --  8.8*  MG  --   --  2.0  --     Liver Function Tests:  Recent Labs Lab 07/18/15 1818 07/19/15 0541  AST 24 36  ALT 20 29  ALKPHOS 53 51  BILITOT 0.5 0.6  PROT 7.3 5.9*  ALBUMIN  3.7 3.1*   No results for input(s): LIPASE, AMYLASE in the last 168 hours. No results for input(s): AMMONIA in the last 168 hours.  CBC:  Recent Labs Lab 07/18/15 1719 07/18/15 1818 07/19/15 0114 07/19/15 0541  WBC  --  8.4 9.1 8.1  NEUTROABS  --  4.6  --   --   HGB 13.9 11.6* 10.4* 10.4*  HCT 41.0 38.2 34.3* 33.1*  MCV  --  75.2* 75.7* 75.7*  PLT  --  250 231 214    Cardiac Enzymes:  Recent Labs Lab 07/19/15 0114 07/19/15 0541  TROPONINI <0.03 <0.03    Lipid Panel:  Recent Labs Lab 07/19/15 0541  CHOL 226*  TRIG 50  HDL 46  CHOLHDL 4.9  VLDL 10  LDLCALC 170*    CBG:  Recent Labs Lab 07/19/15 0432 07/19/15 0812  GLUCAP 100* 78    Microbiology: Results for orders placed or performed during the hospital encounter of 09/26/14  Urine  culture     Status: None   Collection Time: 09/26/14  2:55 PM  Result Value Ref Range Status   Specimen Description URINE, CLEAN CATCH  Final   Special Requests NONE  Final   Colony Count   Final    70,000 COLONIES/ML Performed at [facility]    Culture   Final    Multiple bacterial morphotypes present, none predominant. Suggest appropriate recollection if clinically indicated. Performed at [facility]    Report Status 09/27/2014 FINAL  Final    Imaging: Dg Chest 2 View  07/18/2015  CLINICAL DATA:  Cough and left arm pain for 1 year. Hypertension. Chronic bronchitis. EXAM: CHEST  2 VIEW COMPARISON:  04/25/2014 FINDINGS: Lateral view degraded by patient arm position. Prior median sternotomy. Midline trachea. Mild cardiomegaly. Right-sided aortic arch. Mediastinal contours otherwise within normal limits. No pleural effusion or pneumothorax. No congestive failure. Clear lungs. IMPRESSION: Cardiomegaly without congestive failure. Right-sided aortic arch. Electronically Signed   By: Abigail Miyamoto M.D.   On: 07/18/2015 13:03   Mr Brain Wo Contrast  07/18/2015  CLINICAL DATA:  55 year old hypertensive  female with high cholesterol and history of migraines. Prior VSD repair. Headache since last night. Visual loss for 1 minutes. Initial encounter. EXAM: MRI HEAD WITHOUT CONTRAST TECHNIQUE: Multiplanar, multiecho pulse sequences of the brain and surrounding structures were obtained without intravenous contrast. COMPARISON:  02/20/2010 CT. FINDINGS: No acute infarct or intracranial hemorrhage. No intracranial mass lesion noted on this unenhanced exam. Mild global atrophy without hydrocephalus. Cerebellar tonsils minimally low lying but within the range of normal limits. Partially empty expanded sella without flattening of the posterior globes as seen with pseudotumor cerebri Major intracranial vascular structures are patent with small vertebral arteries and basilar artery. Atypical appearance of the semi circular canals incompletely assessed on present exam. Minimal mucosal thickening ethmoid sinus air cells. IMPRESSION: No acute infarct or intracranial hemorrhage. No intracranial mass lesion noted on this unenhanced exam. Mild global atrophy without hydrocephalus. Cerebellar tonsils minimally low lying but within the range of normal limits. Partially empty expanded sella without flattening of the posterior globes as seen with pseudotumor cerebri. Major intracranial vascular structures are patent with small vertebral arteries and basilar artery. Atypical appearance of the semi circular canals incompletely assessed on present exam. Electronically Signed   By: Genia Del M.D.   On: 07/18/2015 19:42   Dg Shoulder Left  07/18/2015  CLINICAL DATA:  Chronic worsening left anterior shoulder pain. Initial encounter. EXAM: LEFT SHOULDER - 2+ VIEW COMPARISON:  None. FINDINGS: There is no evidence of fracture or dislocation. The left humeral head is seated within the glenoid fossa. The acromioclavicular joint is unremarkable in appearance. No significant soft tissue abnormalities are seen. The visualized portions of the  left lung are clear. IMPRESSION: No evidence of fracture or dislocation. Electronically Signed   By: Garald Balding M.D.   On: 07/18/2015 23:28    Assessment and plan:   Stephanie Bray is an 55 y.o. female patient with complaints of HA. Currently 8/10 however she is resting comfortably watching TV in no significant distress. She has just received Tylenol for her HA. MRI of brain shows no acute abnormalities. Will give patient one time dose of Depakote 500 mg no to ease her HA. At this point management of HA is symptomatic.  May consider migraine cocktail of Compazine/Benadryl and Toradol later.  Seen with Dr. Silverio Decamp. Please see his attestation note for A/P for any additional work up recommendations.

## 2015-07-19 NOTE — Consult Note (Signed)
Cardiology Consult    Patient ID: Stephanie Bray MRN: [id number], DOB/AGE: December 03, 1960   Admit date: 07/18/2015 Date of Consult: 07/19/2015  Primary Physician: Leamon Arnt, MD Primary Cardiologist: P. Johnsie Cancel, MD 04/2014 Requesting Provider: Reece Levy  Patient Profile    55 y/oF with PMH of anxiety, depression, high cholesterol, hypertension, GERD, migraine, iron deficiency anemia, and VSD repaired at 36 months old admitted 07/18/15 for r/o stroke/tia after presenting with headache x1 day, loss of vision x1 minute with subsequent blurred vision and failed swallow screen.  We've been asked to eval 2/2 h/o chest pain.  Past Medical History   Past Medical History  Diagnosis Date  . Anxiety   . Depression   . Essential hypertension   . High cholesterol   . Heart murmur   . Chronic bronchitis ([place])     "get it mostly q yr" (04/25/2014)  . Iron deficiency anemia   . GERD (gastroesophageal reflux disease)   . Migraine     "weekly, at least" (04/25/2014)  . Arthritis     "left hip" (04/25/2014)  . Chest pain     a. 04/2014 MV: small anteroapical perfusion defect - likely breast attenuation->low risk.  . VSD (ventricular septal defect)     a. 04/2014 Echo: EF 55-60%, PASP 31mmHg, small restrictive either supracristal or perimembranous VSD w/o PAH.     Past Surgical History  Procedure Laterality Date  . Cholecystectomy    . Vsd repair  ~ 1963    [facility]/notes 04/25/2014     Allergies  No Known Allergies  History of Present Illness    55 y/oF with PMH of anxiety, depression, hyperlipidemia, hypertension, GERD, migraine, iron deficiency anemia, and VSD with repair at [place] at 26 months of age.  She has since been unable to follow-up with Cardiology at [facility] due to transportation issues (last seen by T. Bashore in 2012).  She was seen on 04/26/14 on an inpatient consult by Dr. Johnsie Cancel for atypical chest pain in the setting of URI and dyspnea.  Given risk factors of VSD,  hypertension, hyperlipidemia, and diabetes, Echo performed on 04/26/2014 resultant for EF 55-60%, PASP 15mmHg, and small restrictive either supracristal or perimembranous VSD w/o PAH.  04/26/14 MV found small anteroapical perfusion defect, most likely breast attenuation, felt low risk.  Therefore cath was deferred at that time.    Over the last two months, she reports frequent episodes, approx 1x/month, of sharp, stabbing left chest pain assoc with occasional dizziness or/and diaphoresis.  Ss generally occur with some amt of activity but may occur @ rest and resolve w/in 1-2 mins w/o intervention.  This discomfort is similar to what she presented with in 04/2014.  At times, she will take a nitro sl at home with relief in pain.  Reports pain is different from GERD symptoms.  She has some degree of chronic DOE, which has not changed.  Additionally, she complains of pain in her left shoulder and upper arm for "years".  Unable to lift arm above her heart secondary to pain.   Denies any significant family history for heart disease.    She is fairly active at home, lives with her adult three sons, one autistic, and often spends her days driving them around and cleaning house.  States 2015 was a stressful year due to the passing of her mother to COPD, losing her house for financial reasons, and loss of a beloved pet.  Reports financially unable to afford some of her medications but  is taking her zocor.    On 3/13, she developed a headache that would not go away.  On 3/14, while driving, she had acute loss of vision in both of her eyes.  She pulled over on the side of the road and vision returned within a minute.  She then presented to the [facility] ED and was admitted for further eval.   She failed her swallow screen and was seen by neuro.  MRI was performed and showed no acute infarct or intracranial hemorrhage.  During MRI, she c/o chest pain (in the setting of anxiety from being in MRI scanner) and this resolved after  1-2 minutes.  There were no associated Ss.  It was similar in character to prior c/p though slightly more severe.  Since, ECG has been non-acute and trops nl.  She has had no recurrence of c/p.     Inpatient Medications    . aspirin  300 mg Rectal Daily   Or  . aspirin  325 mg Oral Daily  . enoxaparin (LOVENOX) injection  40 mg Subcutaneous Q24H  . insulin aspart  0-9 Units Subcutaneous TID WC  . pantoprazole (PROTONIX) IV  40 mg Intravenous Q24H  . valproate sodium  500 mg Intravenous NOW    Family History    Family History  Problem Relation Age of Onset  . COPD Mother   . Hypertension Father   . Hyperlipidemia Father     Social History    Social History   Social History  . Marital Status: Divorced    Spouse Name: N/A  . Number of Children: N/A  . Years of Education: N/A   Occupational History  . Not on file.   Social History Main Topics  . Smoking status: Never Smoker   . Smokeless tobacco: Never Used  . Alcohol Use: No  . Drug Use: No  . Sexual Activity: No   Other Topics Concern  . Not on file   Social History Narrative   Lives in [place] with her three sons.    Hobbies: reads and draw    On disability due to anxiety      Review of Systems    General:  No chills, fever, night sweats or weight changes.  Cardiovascular:  +++ chest pain, +++dyspnea on exertion, No edema, orthopnea, palpitations, paroxysmal nocturnal dyspnea. Dermatological: No rash, lesions/masses Respiratory: No cough, ++ Chronic DOE Urologic: No hematuria, dysuria Abdominal:   No nausea, vomiting, diarrhea, bright red blood per rectum, melena, or hematemesis Neurologic:  +++ visual changes, +++wkns, No changes in mental status. All other systems reviewed and are otherwise negative except as noted above.  Physical Exam    Blood pressure 115/63, pulse 59, temperature 97.9 F (36.6 C), temperature source Oral, resp. rate 18, height 4\' 10"  (1.473 m), weight 173 lb 4.5 oz (78.6 kg),  SpO2 99 %.  General: Pleasant, NAD Psych: Depressed affect.  Tearful at times. Neuro: Alert and oriented X 3. Moves all extremities spontaneously.  Limited abduction of left arm secondary to pain HEENT: Normal  Neck: Supple without bruits or JVD. Lungs:  Resp regular and unlabored, CTA. Heart: RRR no s3, s4.  3/6 SEM loudest @ LUSB but heard throughout.  Abdomen: Soft, non-tender, non-distended, BS + x 4.  Extremities: No clubbing or cyanosis.  Trace pedal edema. DP/PT/Radials 2+ and equal bilaterally.  Labs    Troponin [employer] of Care Test)  Recent Labs  07/18/15 2051  TROPIPOC 0.01    Recent Labs  07/19/15 0114 07/19/15 0541  TROPONINI <0.03 <0.03   Lab Results  Component Value Date   WBC 8.1 07/19/2015   HGB 10.4* 07/19/2015   HCT 33.1* 07/19/2015   MCV 75.7* 07/19/2015   PLT 214 07/19/2015     Recent Labs Lab 07/19/15 0541  NA 141  K 4.0  CL 107  CO2 27  BUN 12  CREATININE 0.48  CALCIUM 8.8*  PROT 5.9*  BILITOT 0.6  ALKPHOS 51  ALT 29  AST 36  GLUCOSE 92   Lab Results  Component Value Date   CHOL 226* 07/19/2015   HDL 46 07/19/2015   LDLCALC 170* 07/19/2015   TRIG 50 07/19/2015   Lab Results  Component Value Date   DDIMER 0.36 04/25/2014     Radiology Studies    Dg Chest 2 View  07/18/2015  CLINICAL DATA:  Cough and left arm pain for 1 year. Hypertension. Chronic bronchitis. EXAM: CHEST  2 VIEW COMPARISON:  04/25/2014 FINDINGS: Lateral view degraded by patient arm position. Prior median sternotomy. Midline trachea. Mild cardiomegaly. Right-sided aortic arch. Mediastinal contours otherwise within normal limits. No pleural effusion or pneumothorax. No congestive failure. Clear lungs. IMPRESSION: Cardiomegaly without congestive failure. Right-sided aortic arch. Electronically Signed   By: Abigail Miyamoto M.D.   On: 07/18/2015 13:03   Mr Brain Wo Contrast  07/18/2015  CLINICAL DATA:  55 year old hypertensive female with high cholesterol and history  of migraines. Prior VSD repair. Headache since last night. Visual loss for 1 minutes. Initial encounter. EXAM: MRI HEAD WITHOUT CONTRAST TECHNIQUE: Multiplanar, multiecho pulse sequences of the brain and surrounding structures were obtained without intravenous contrast. COMPARISON:  02/20/2010 CT. FINDINGS: No acute infarct or intracranial hemorrhage. No intracranial mass lesion noted on this unenhanced exam. Mild global atrophy without hydrocephalus. Cerebellar tonsils minimally low lying but within the range of normal limits. Partially empty expanded sella without flattening of the posterior globes as seen with pseudotumor cerebri Major intracranial vascular structures are patent with small vertebral arteries and basilar artery. Atypical appearance of the semi circular canals incompletely assessed on present exam. Minimal mucosal thickening ethmoid sinus air cells. IMPRESSION: No acute infarct or intracranial hemorrhage. No intracranial mass lesion noted on this unenhanced exam. Mild global atrophy without hydrocephalus. Cerebellar tonsils minimally low lying but within the range of normal limits. Partially empty expanded sella without flattening of the posterior globes as seen with pseudotumor cerebri. Major intracranial vascular structures are patent with small vertebral arteries and basilar artery. Atypical appearance of the semi circular canals incompletely assessed on present exam. Electronically Signed   By: Genia Del M.D.   On: 07/18/2015 19:42   Dg Shoulder Left  07/18/2015  CLINICAL DATA:  Chronic worsening left anterior shoulder pain. Initial encounter. EXAM: LEFT SHOULDER - 2+ VIEW COMPARISON:  None. FINDINGS: There is no evidence of fracture or dislocation. The left humeral head is seated within the glenoid fossa. The acromioclavicular joint is unremarkable in appearance. No significant soft tissue abnormalities are seen. The visualized portions of the left lung are clear. IMPRESSION: No  evidence of fracture or dislocation. Electronically Signed   By: Garald Balding M.D.   On: 07/18/2015 23:28    ECG & Cardiac Imaging    67 NSR with 1st degree AV block, RBBB unchanged  04/26/14 ECHO:  Left ventricle: The cavity size was normal. Wall thickness was normal. Systolic function was normal. The estimated ejection fraction was in the range of 55% to 60%. - Aortic valve: Valve area (  VTI): 3.71 cm^2. Valve area (Vmax): 3.25 cm^2. Valve area (Vmean): 3.48 cm^2. - Atrial septum: No defect or patent foramen ovale was identified. - Pulmonary arteries: PA peak pressure: 42 mm Hg (S). - Impressions: Small reestrictive either supra cristal or perimembraneous VSD with no signs of pulmonary hypertension. Impressions: Small reestrictive either supra cristal or perimembraneous VSD with no signs of pulmonary hypertension.  04/26/14 MV: Low risk myovue Small anteroapical perfusion defect on stress images likely breast artifact SDS only 4 in setting of atypical chest pain and r/o would not cath Outpatient f/u for VSD  Assessment & Plan    1.  Atypical Chest pain:  Pt has a h/o atypical, intermittent, sharp left sided c/p occurring at least once/month since late 2015.  She had a nuc study @ that time, which was low risk.  Echo was nl @ that time.  She had a brief episode of c/p during MRI yesterday, lasting about 2 mins and resolving spontaneously.  She has been pain free since.  CE neg.  ECG non-acute.  Given prior w/u for the same, atypical, symptoms, I would not pursue any additional inpt eval @ this time.  We can arrange for outpt f/u with Dr. Johnsie Cancel.  2.  VSD: s/p prior repair.  Echo 04/2014 showed small, restrictive VSD w/o signs for PAH.  Recommendation was made @ that time for SBE prophylaxis.  3.  Hyperlipidemia:  She is on zocor 10mg  daily, though given lipid findings, doubt that she is taking it.  Given ASCVD guidelines with hx of DM, with 10 yr risk score of 4.4, she  should be on a moderate intensity statin. Rec increasing to at least 20 mg daily.  4. Essential Hypertension: BP has been stable.  Continue monitor and follow-up on outpatient setting.  Avoid beta-blockers in the setting of prior history of bradycardia.  Ideally should be on ACEI though pressure is soft on no meds.  5. Anxiety/Depression:  Per IM. May be a driving issue behind yesterday's episode of c/p.  6. Iron Deficiency Anemia:  Per IM.  7.  Type II DM:  Per IM.  Rogelia Mire, NP  I have seen and examined the patient along with Rogelia Mire, NP.  I have reviewed the chart, notes and new data.  I agree with NP's note.  Key new complaints: chest pain is very atypical, occurred during anxiety, resolved rapidly Key examination changes: loud holosystolic murmur throughout the precordium Key new findings / data: low risk ECG and cardiac biomarkers. No wall motion abnormalities on echo. Small residual VSD is not hemodynamically important or likely to be related to her neurological or chest symptoms.  PLAN: Previous (and recent) workup for similar atypical CP was negative. No worrisome clinical data. No plan for additional coronary w/u at this time.  Sanda Klein, MD, [place] [phone] 07/19/2015, 4:55 PM

## 2015-07-19 NOTE — Evaluation (Addendum)
Physical Therapy Evaluation and Discharge Patient Details Name: Stephanie Bray MRN: [id number] DOB: Apr 16, 1961 Today's Date: 07/19/2015   History of Present Illness  presented with vision changes (varied no vision to blurry) with MRI negative, +HA PMHx-DM, migraines, anxiety/depression, Lt THA    Clinical Impression  Patient presents with inconsistent weakness of LLE. Demonstrates break away weakness in all muscle groups. Demonstrated no foot drop, shuffle, or knee buckling with ambulation, yet demonstrates 2-/5 strength in actual strength testing. Scored 51/56 on Berg Balance Assessment. Patient reports she has not been told results of her MRI ("they think I had a stroke. You get weak on one side when you have a stroke, right?"). No further PT needs identified.     Follow Up Recommendations No PT follow up    Equipment Recommendations  None recommended by PT    Recommendations for Other Services       Precautions / Restrictions Precautions Precautions: Fall      Mobility  Bed Mobility Overal bed mobility: Independent                Transfers Overall transfer level: Independent Equipment used: None             General transfer comment: x3, various surface height  Ambulation/Gait Ambulation/Gait assistance: Independent Ambulation Distance (Feet): 180 Feet Assistive device: None Gait Pattern/deviations: Step-through pattern (slight limp with LLE ? shorter than Rt) Gait velocity: educated to walk more quickly to improve her balance; able to increase her velocity a small amount Gait velocity interpretation: Below normal speed for age/gender    Stairs            Wheelchair Mobility    Modified Rankin (Stroke Patients Only)       Balance Overall balance assessment: Independent Sitting-balance support: No upper extremity supported;Feet unsupported Sitting balance-Leahy Scale: Good     Standing balance support: No upper extremity supported Standing  balance-Leahy Scale: Normal               High level balance activites: Backward walking;Direction changes;Turns;Sudden stops;Head turns High Level Balance Comments: no imbalance noted Standardized Balance Assessment Standardized Balance Assessment : Berg Balance Test Berg Balance Test Sit to Stand: Able to stand without using hands and stabilize independently Standing Unsupported: Able to stand safely 2 minutes Sitting with Back Unsupported but Feet Supported on Floor or Stool: Able to sit safely and securely 2 minutes Stand to Sit: Sits safely with minimal use of hands Transfers: Able to transfer safely, minor use of hands Standing Unsupported with Eyes Closed: Able to stand 10 seconds safely Standing Ubsupported with Feet Together: Able to place feet together independently and stand 1 minute safely From Standing, Reach Forward with Outstretched Arm: Can reach confidently >25 cm (10") From Standing Position, Pick up Object from Floor: Able to pick up shoe safely and easily From Standing Position, Turn to Look Behind Over each Shoulder: Looks behind one side only/other side shows less weight shift Turn 360 Degrees: Able to turn 360 degrees safely in 4 seconds or less Standing Unsupported, Alternately Place Feet on Step/Stool: Able to stand independently and complete 8 steps >20 seconds Standing Unsupported, One Foot in Front: Able to plae foot ahead of the other independently and hold 30 seconds Standing on One Leg: Able to lift leg independently and hold equal to or more than 3 seconds Total Score: 51         Pertinent Vitals/Pain Pain Assessment: No/denies pain (reports medicine for HA worked )  Pain Score: 2  Faces Pain Scale: Hurts a little bit Pain Location: head Pain Intervention(s): Patient requesting pain meds-RN notified    Home Living Family/patient expects to be discharged to:: Private residence Living Arrangements: Children Available Help at Discharge:  Family;Available 24 hours/day Type of Home: House Home Access: Stairs to enter Entrance Stairs-Rails: Left Entrance Stairs-Number of Steps: 4 Home Layout: Two level;Laundry or work area in [employer]: None      Prior Function Level of Independence: Needs assistance   Gait / Transfers Assistance Needed: lately off-balance so she has slowed down her walking and balance has gotten worse; near falls when she bends down to pick items up from floor  ADL's / Homemaking Assistance Needed: sons do Estate agent [employer]   Dominant Hand: Right    Extremity/Trunk Assessment   Upper Extremity Assessment: Defer to OT evaluation           Lower Extremity Assessment: LLE deficits/detail   LLE Deficits / Details: pt reports LLE is weak; pt with poor effort and break-away weakness in hip flexion, knee extension, ankle DF and PF  Cervical / Trunk Assessment: Normal  Communication   Communication: No difficulties  Cognition Arousal/Alertness: Awake/alert Behavior During Therapy: WFL for tasks assessed/performed Overall Cognitive Status: Within Functional Limits for tasks assessed                      General Comments      Exercises        Assessment/Plan    PT Assessment Patent does not need any further PT services  PT Diagnosis Generalized weakness   PT Problem List    PT Treatment Interventions     PT Goals (Current goals can be found in the Care Plan section) Acute Rehab PT Goals PT Goal Formulation: All assessment and education complete, DC therapy    Frequency     Barriers to discharge        Co-evaluation               End of Session Equipment Utilized During Treatment: Gait belt Activity Tolerance: Patient tolerated treatment well Patient left: in chair;with call bell/phone within reach;with nursing/sitter in room Nurse Communication: Mobility status;Other (comment) (inconsistent "weakness" LLE)    Functional Assessment  Tool Used: clinical judgement Functional Limitation: Mobility: Walking and moving around Mobility: Walking and Moving Around Current Status [id number]): At least 1 percent but less than 20 percent impaired, limited or restricted Mobility: Walking and Moving Around Goal Status [phone]): At least 1 percent but less than 20 percent impaired, limited or restricted Mobility: Walking and Moving Around Discharge Status [phone]): At least 1 percent but less than 20 percent impaired, limited or restricted    Time: [phone] PT Time Calculation (min) (ACUTE ONLY): 29 min   Charges:   PT Evaluation $PT Eval Moderate Complexity: 1 Procedure PT Treatments $Gait Training: 8-22 mins   PT G Codes:   PT G-Codes **NOT FOR INPATIENT CLASS** Functional Assessment Tool Used: clinical judgement Functional Limitation: Mobility: Walking and moving around Mobility: Walking and Moving Around Current Status [id number]): At least 1 percent but less than 20 percent impaired, limited or restricted Mobility: Walking and Moving Around Goal Status [phone]): At least 1 percent but less than 20 percent impaired, limited or restricted Mobility: Walking and Moving Around Discharge Status [phone]): At least 1 percent but less than 20 percent impaired, limited or restricted    Angila Wombles 07/19/2015,  10:43 AM Pager [phone]

## 2015-07-19 NOTE — Progress Notes (Signed)
OT Cancellation Note  Patient Details Name: Stephanie Bray MRN: [id number] DOB: 05/28/1960   Cancelled Treatment:    Reason Eval/Treat Not Completed: Patient at procedure or test/ unavailable Will follow as schedule allows.  Simonne Come 07/19/2015, 2:10 PM

## 2015-07-20 DIAGNOSIS — G43711 Chronic migraine without aura, intractable, with status migrainosus: Secondary | ICD-10-CM | POA: Insufficient documentation

## 2015-07-20 DIAGNOSIS — G458 Other transient cerebral ischemic attacks and related syndromes: Secondary | ICD-10-CM

## 2015-07-20 DIAGNOSIS — M6248 Contracture of muscle, other site: Secondary | ICD-10-CM

## 2015-07-20 DIAGNOSIS — M25512 Pain in left shoulder: Secondary | ICD-10-CM | POA: Diagnosis not present

## 2015-07-20 DIAGNOSIS — R072 Precordial pain: Secondary | ICD-10-CM | POA: Diagnosis not present

## 2015-07-20 DIAGNOSIS — G459 Transient cerebral ischemic attack, unspecified: Secondary | ICD-10-CM | POA: Diagnosis not present

## 2015-07-20 DIAGNOSIS — H538 Other visual disturbances: Secondary | ICD-10-CM | POA: Diagnosis not present

## 2015-07-20 DIAGNOSIS — M62838 Other muscle spasm: Secondary | ICD-10-CM | POA: Insufficient documentation

## 2015-07-20 LAB — GLUCOSE, CAPILLARY
GLUCOSE-CAPILLARY: 208 mg/dL — AB (ref 65–99)
Glucose-Capillary: 148 mg/dL — ABNORMAL HIGH (ref 65–99)

## 2015-07-20 LAB — HEMOGLOBIN A1C
Hgb A1c MFr Bld: 6.1 % — ABNORMAL HIGH (ref 4.8–5.6)
Mean Plasma Glucose: 128 mg/dL

## 2015-07-20 NOTE — Progress Notes (Signed)
Hard copy Prescriptions given to [employer] (Neurologist PA).  Ave Filter, RN
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
## 2015-07-20 NOTE — Care Management Note (Signed)
Case Management Note  Patient Details  Name: Stephanie Bray MRN: [id number] Date of Birth: 11-Nov-1960  Subjective/Objective:                    Action/Plan: Patient discharging to home with self care. No further needs per CM.   Expected Discharge Date:                  Expected Discharge Plan:  Home/Self Care  In-House Referral:     Discharge planning Services     Post Acute Care Choice:    Choice offered to:     DME Arranged:    DME Agency:     HH Arranged:    [place] Agency:     Status of Service:  Completed, signed off  Medicare Important Message Given:    Date Medicare IM Given:    Medicare IM give by:    Date Additional Medicare IM Given:    Additional Medicare Important Message give by:     If discussed at [place] of Stay Meetings, dates discussed:    Additional Comments:  Pollie Friar, RN 07/20/2015, 10:55 AM

## 2015-07-20 NOTE — Progress Notes (Signed)
Pt discharged from hospital per orders from MD. Pt educated on discharge instructions. Pt verbalized understanding of instructions. Pt also received paper scripts provided by Neuro PA. Attempted multiple times to get in touch with attending with no response about scripts to take home. Pt's IV was removed before discharge. All questions and concerns were addressed by RN. Patient exited hospital via wheelchair.

## 2015-07-20 NOTE — Progress Notes (Signed)
Speech Language Pathology Treatment: Dysphagia  Patient Details Name: Stephanie Bray MRN: [id number] DOB: 08-31-1960 Today's Date: 07/20/2015 Time: 1320-1340 SLP Time Calculation (min) (ACUTE ONLY): 20 min  Assessment / Plan / Recommendation Clinical Impression  Pt today reports no dysphagia and denies "choking" with any intake.  She admits to xerostomia and SLP educated her to mitigation strategies, providing information in writing.    Pt does report she is out of her PPI and stopped taking it at home approximately 2 months ago due to expense - omeprazole.  She reported increased coughing when lying down and increased cough being off PPI.  Requested RN to seek if pt could get prescription for medication to potentially decrease cost.    SLP to sign off as all education completed and pt appears to be tolerating diet well. Suspect cough with intake may be more GERD related and mitigation strategies provided in writing.     HPI HPI: 55 [facility] and headache.  PMH +for HTN, DM, GERD, migraines.  Pt MRI negative, CXR showed cardiomegaly, right sided aortic arch, otherwise negative.  Swallow and speech evaluation ordered.       SLP Plan    All goals met    Recommendations  Diet recommendations: Regular;Thin liquid Liquids provided via: Straw;Cup Medication Administration: Whole meds with liquid Supervision: Patient able to self feed Compensations: Slow rate;Small sips/bites             Follow up Recommendations: None     GO           Functional Assessment Tool Used: clinical ljudgement Functional Limitations: Swallowing Swallow Current Status ([id number]): At least 1 percent but less than 20 percent impaired, limited or restricted Swallow Goal Status [phone]): At least 1 percent but less than 20 percent impaired, limited or restricted Swallow Discharge Status [phone]): At least 1 percent but less than 20 percent impaired, limited or restricted    Stephanie Bray, [place] [facility] SLP [phone]

## 2015-07-20 NOTE — Progress Notes (Signed)
Spoke with Dr Silverio Decamp and he wishes patient go home on Depakote 250 mg BID, Baclofen 5 MG TID and medrol dose Pack.  I have written the scripts that Dr. Silverio Decamp whishes her to go home on. Any further questions please Call Dr. Silverio Decamp.   Etta Quill PA-C [facility] [phone]  07/20/2015, 1:04 PM

## 2015-07-20 NOTE — Progress Notes (Signed)
Patient requested spiritual consult and seems to be in better spirits afterwards, she also seems to be having emotional stress with possible family issues that she did not elaborate on, will continue to monitor.

## 2015-07-21 LAB — URINE CULTURE

## 2015-09-05 ENCOUNTER — Ambulatory Visit: Payer: Medicare Other | Admitting: Dietician

## 2015-09-13 ENCOUNTER — Ambulatory Visit: Payer: Medicare Other | Admitting: Dietician

## 2015-09-19 ENCOUNTER — Ambulatory Visit: Payer: Medicare Other | Admitting: Dietician

## 2015-09-28 ENCOUNTER — Encounter: Payer: Self-pay | Admitting: Dietician

## 2015-10-30 ENCOUNTER — Emergency Department (HOSPITAL_COMMUNITY)
Admission: EM | Admit: 2015-10-30 | Discharge: 2015-10-30 | Disposition: A | Discharge status: Home / Self Care | Payer: Medicare Other | Attending: Emergency Medicine | Admitting: Emergency Medicine

## 2015-10-30 ENCOUNTER — Emergency Department (HOSPITAL_COMMUNITY): Payer: Medicare Other

## 2015-10-30 ENCOUNTER — Encounter (HOSPITAL_COMMUNITY): Payer: Self-pay

## 2015-10-30 ENCOUNTER — Other Ambulatory Visit: Payer: Self-pay

## 2015-10-30 DIAGNOSIS — R05 Cough: Secondary | ICD-10-CM | POA: Insufficient documentation

## 2015-10-30 DIAGNOSIS — R053 Chronic cough: Secondary | ICD-10-CM

## 2015-10-30 DIAGNOSIS — Z79899 Other long term (current) drug therapy: Secondary | ICD-10-CM | POA: Diagnosis not present

## 2015-10-30 DIAGNOSIS — E119 Type 2 diabetes mellitus without complications: Secondary | ICD-10-CM | POA: Diagnosis not present

## 2015-10-30 DIAGNOSIS — Z7984 Long term (current) use of oral hypoglycemic drugs: Secondary | ICD-10-CM | POA: Insufficient documentation

## 2015-10-30 DIAGNOSIS — R0602 Shortness of breath: Secondary | ICD-10-CM | POA: Insufficient documentation

## 2015-10-30 DIAGNOSIS — I10 Essential (primary) hypertension: Secondary | ICD-10-CM | POA: Insufficient documentation

## 2015-10-30 LAB — BASIC METABOLIC PANEL
Anion gap: 8 (ref 5–15)
BUN: 16 mg/dL (ref 6–20)
CALCIUM: 9.8 mg/dL (ref 8.9–10.3)
CHLORIDE: 104 mmol/L (ref 101–111)
CO2: 25 mmol/L (ref 22–32)
CREATININE: 0.67 mg/dL (ref 0.44–1.00)
GFR calc Af Amer: >60 mL/min (ref >60)
GFR calc non Af Amer: >60 mL/min (ref >60)
GLUCOSE: 98 mg/dL (ref 65–99)
Potassium: 3.6 mmol/L (ref 3.5–5.1)
Sodium: 137 mmol/L (ref 135–145)

## 2015-10-30 LAB — CBC WITH DIFFERENTIAL/PLATELET
Basophils Absolute: 0 10*3/uL (ref 0.0–0.1)
Basophils Relative: 0 %
Eosinophils Absolute: 0.1 10*3/uL (ref 0.0–0.7)
Eosinophils Relative: 1 %
HEMATOCRIT: 37.4 % (ref 36.0–46.0)
HEMOGLOBIN: 11.7 g/dL — AB (ref 12.0–15.0)
LYMPHS ABS: 2.4 10*3/uL (ref 0.7–4.0)
Lymphocytes Relative: 32 %
MCH: 23.4 pg — AB (ref 26.0–34.0)
MCHC: 31.3 g/dL (ref 30.0–36.0)
MCV: 74.9 fL — AB (ref 78.0–100.0)
MONO ABS: 0.9 10*3/uL (ref 0.1–1.0)
MONOS PCT: 12 %
NEUTROS ABS: 4.1 10*3/uL (ref 1.7–7.7)
NEUTROS PCT: 55 %
Platelets: 250 10*3/uL (ref 150–400)
RBC: 4.99 MIL/uL (ref 3.87–5.11)
RDW: 16.2 % — AB (ref 11.5–15.5)
WBC: 7.5 10*3/uL (ref 4.0–10.5)

## 2015-10-30 LAB — I-STAT TROPONIN, ED: Troponin i, poc: 0 ng/mL (ref 0.00–0.08)

## 2015-10-30 LAB — BRAIN NATRIURETIC PEPTIDE: B Natriuretic Peptide: 26.2 pg/mL (ref 0.0–100.0)

## 2015-10-30 MED ORDER — BENZONATATE 100 MG PO CAPS
100.0000 mg | ORAL_CAPSULE | Freq: Once | ORAL | Status: AC
  Administered 2015-10-30: 100 mg via ORAL
  Filled 2015-10-30: qty 1

## 2015-10-30 MED ORDER — ACETAMINOPHEN 325 MG PO TABS: 325.0000 mg | ORAL_TABLET | Freq: Once | ORAL | Status: DC

## 2015-10-30 MED ORDER — FLUTICASONE PROPIONATE 50 MCG/ACT NA SUSP: 2.0000 | Freq: Every day | NASAL | Status: DC

## 2015-10-30 MED ORDER — BENZONATATE 100 MG PO CAPS: 100.0000 mg | ORAL_CAPSULE | Freq: Three times a day (TID) | ORAL | Status: DC

## 2015-10-30 MED ORDER — ALBUTEROL SULFATE (2.5 MG/3ML) 0.083% IN NEBU: 5.0000 mg | INHALATION_SOLUTION | Freq: Once | RESPIRATORY_TRACT | Status: DC

## 2015-10-30 NOTE — ED Provider Notes (Signed)
CSN: [id number]     Arrival date & time 10/30/15  1456 History   First MD Initiated Contact with Patient 10/30/15 2120     Chief Complaint  Patient presents with  . Cough     (Consider location/radiation/quality/duration/timing/severity/associated sxs/prior Treatment) HPI Per EMS, Pt has had cough for one year intermittently with HX of bronchitis. Reports dry cough with no sputum. Pt has been taking Lisinopril in past but no longer.Pt was placed on 2L for comfort. Pt reports "it feels hard to breath." Patient states that she has had a history of heart murmur that has been repaired, VSD, and has a murmur secondary to this. Family states that her cough is significantly worse at night. She appears to cough multiple times during the night and has trouble sleeping as a result of this. The cough is dry. No fevers no chills. Does have some chest pain intermittently and then ongoing for about a year but is not worse with exertion, not associated with shortness of breath, nonradiating. Cp is substernal. They deny any history of congestive heart failure but do state that her cough appears to be worse when she is laying down at night. No drug use, no back pain. Is diabetic and hypertensive but otherwise no other medical conditions. No diaphoresis.  Past Medical History  Diagnosis Date  . Anxiety   . Depression   . Essential hypertension   . High cholesterol   . Heart murmur   . Chronic bronchitis ([place])     "get it mostly q yr" (04/25/2014)  . Iron deficiency anemia   . GERD (gastroesophageal reflux disease)   . Migraine     "weekly, at least" (04/25/2014)  . Arthritis     "left hip" (04/25/2014)  . Chest pain     a. 04/2014 MV: small anteroapical perfusion defect - likely breast attenuation->low risk.  . VSD (ventricular septal defect)     a. 04/2014 Echo: EF 55-60%, PASP 50mmHg, small restrictive either supracristal or perimembranous VSD w/o PAH.   Past Surgical History  Procedure Laterality  Date  . Cholecystectomy    . Vsd repair  ~ 1963    [facility]/notes 04/25/2014   Family History  Problem Relation Age of Onset  . COPD Mother   . Hypertension Father   . Hyperlipidemia Father    Social History  Substance Use Topics  . Smoking status: Never Smoker   . Smokeless tobacco: Never Used  . Alcohol Use: No   OB History    No data available     Review of Systems  Constitutional: Negative for fever.  Respiratory: Positive for cough, chest tightness and shortness of breath.   Cardiovascular: Negative for palpitations and leg swelling.  Allergic/Immunologic: Negative for immunocompromised state.  Neurological: Negative for light-headedness.  All other systems reviewed and are negative.     Allergies  Other and Ace inhibitors  Home Medications   Prior to Admission medications   Medication Sig Start Date End Date Taking? Authorizing Provider  busPIRone (BUSPAR) 10 MG tablet Take 10 mg by mouth 3 (three) times daily.   Yes Historical Provider, MD  Calcium Carbonate (CALCIUM 600 PO) Take 1 tablet by mouth every morning.   Yes Historical Provider, MD  Ferrous Sulfate (IRON) 325 (65 Fe) MG TABS Take 325 mg by mouth daily.   Yes Historical Provider, MD  FLUoxetine (PROZAC) 20 MG capsule Take 20 mg by mouth every morning. 10/11/15  Yes Historical Provider, MD  metFORMIN (GLUCOPHAGE-XR) 500 MG 24  hr tablet Take 500 mg by mouth every morning. 10/26/15  Yes Historical Provider, MD  nitroGLYCERIN (NITROSTAT) 0.4 MG SL tablet Place 1 tablet (0.4 mg total) under the tongue every 5 (five) minutes as needed for chest pain. 04/26/14  Yes Kirby N Rumley, DO  omeprazole (PRILOSEC) 20 MG capsule Take 20 mg by mouth every morning.   Yes Historical Provider, MD  simvastatin (ZOCOR) 10 MG tablet Take 10 mg by mouth daily.   Yes Historical Provider, MD  traZODone (DESYREL) 100 MG tablet Take 200 mg by mouth at bedtime as needed for sleep.    Yes Historical Provider, MD  benzonatate (TESSALON)  100 MG capsule Take 1 capsule (100 mg total) by mouth every 8 (eight) hours. 10/30/15   Karma Greaser, MD  fluticasone (FLONASE) 50 MCG/ACT nasal spray Place 2 sprays into both nostrils daily. 10/30/15   Karma Greaser, MD  metoprolol tartrate (LOPRESSOR) 25 MG tablet Take 25 mg by mouth. 10/11/15   Historical Provider, MD  VESICARE 5 MG tablet Take 5 mg by mouth. 10/11/15   Historical Provider, MD   BP 126/54 mmHg  Pulse 68  Temp(Src) 98 F (36.7 C) (Oral)  Resp 19  Ht 4\' 10"  (1.473 m)  Wt 74.844 kg  BMI 34.49 kg/m2  SpO2 96% Physical Exam  Constitutional: She is oriented to person, place, and time. She appears well-developed and well-nourished. No distress.  HENT:  Head: Normocephalic and atraumatic.  Right Ear: External ear normal.  Left Ear: External ear normal.  Mouth/Throat: Oropharynx is clear and moist. No oropharyngeal exudate.  Tympanic membranes without bulging, erythema  Eyes: Conjunctivae are normal. Right eye exhibits no discharge. Left eye exhibits no discharge.  Neck: Normal range of motion. Neck supple.  Cardiovascular: Normal rate, regular rhythm and intact distal pulses.   Murmur (systolic) heard. Pulmonary/Chest: Effort normal and breath sounds normal. No respiratory distress. She has no wheezes. She has no rales. She exhibits no tenderness.  Abdominal: Soft. Bowel sounds are normal. She exhibits no distension and no mass. There is no tenderness. There is no rebound and no guarding.  Musculoskeletal: She exhibits no edema ( Of lower extremities, no asymmetry).  Lymphadenopathy:    She has no cervical adenopathy.  Neurological: She is alert and oriented to person, place, and time.  Skin: Skin is warm. No rash noted.  Psychiatric: She has a normal mood and affect.  Nursing note and vitals reviewed.   ED Course  Procedures (including critical care time) Labs Review Labs Reviewed  CBC WITH DIFFERENTIAL/PLATELET - Abnormal; Notable for the following:     Hemoglobin 11.7 (*)    MCV 74.9 (*)    MCH 23.4 (*)    RDW 16.2 (*)    All other components within normal limits  BRAIN NATRIURETIC PEPTIDE  BASIC METABOLIC PANEL  I-STAT TROPOININ, ED    Imaging Review Dg Chest 2 View  10/30/2015  CLINICAL DATA:  Cough for 1 year intermittently, history bronchitis, dry cough with no sputum, hypertension, GERD, history of VSD EXAM: CHEST  2 VIEW COMPARISON:  07/18/2015 FINDINGS: Enlargement of cardiac silhouette. Remote median sternotomy. RIGHT-side aortic arch. Pulmonary vascularity normal.  New lungs clear. No pleural effusion or pneumothorax. Bones unremarkable. IMPRESSION: Enlargement of cardiac silhouette with RIGHT-side aortic arch and evidence of prior cardiac surgery (by history VSD repair). No acute abnormalities. Electronically Signed   By: Lavonia Dana M.D.   On: 10/30/2015 15:32   I have personally reviewed and evaluated these  images and lab results as part of my medical decision-making.   EKG Interpretation None      MDM   Final diagnoses:  Chronic cough   No evidence of CHF from the BNP and chest x-ray. His x-ray without pleural effusion, pulmonary edema, pneumothorax, pneumonia, lung nodule. EKG without evidence of acute ischemia and troponin is negative. Doubt ACS chest pain is not new. Doubt pulmonary embolism, aortic dissection. No hemoptysis and the cough. Patient does endorse when speaking with the attending questional aspiration, may benefit from follow-up with GI. There is some concern of allergies at home per family. We'll prescribe Flonase nasal spray and have patient follow with primary care provider and discuss with them whether GI follow-up would be appropriate as well. Patient is not currently taking any Ace inhibitors. Pt states feels much better after tessalon pearls. Medications reviewed and she states to follow-up with primary care provider with strict return precautions.  Karma Greaser, MD 10/31/15 0002  Noemi Chapel, MD 10/31/15 [phone]

## 2015-10-30 NOTE — ED Notes (Signed)
Per EMS, Pt has had cough for one year intermittently with HX of bronchitis. Reports dry cough with no sputum. Pt has been taking Lisinopril. Vitals per EMS 136/82, 80 HR, 97% on RA, 20 RR. Pt was placed on 2L for comfort. Pt reports "it feels hard to breath."

## 2015-10-30 NOTE — ED Notes (Signed)
Walked patient to the bathroom.... Patient stated that she felt a little dizzy but nothing major. Patient state she felt fine walking.Marland KitchenMarland Kitchen

## 2015-10-30 NOTE — Discharge Instructions (Signed)

## 2015-10-30 NOTE — ED Provider Notes (Signed)
The patient is a the patient is a 55 year old female who has had a cough for over 1 year, it seems to get worse at night and when she eats, it is dry, she seems to feel like she is having some aspiration events occasionally when she eats. She denies fever and is not having any chest pain at this time. On exam she has clear lung sounds but has a small dry frequent cough throughout the exam.  Has clear heart sounds with murmur, clear speech, no distress  I saw and evaluated the patient, reviewed the resident's note and I agree with the findings and plan.  ED ECG REPORT  I personally interpreted this EKG   Date: 10/31/2015   Rate: 73  Rhythm: normal sinus rhythm  QRS Axis: normal  Intervals: normal  ST/T Wave abnormalities: nonspecific T wave changes  Conduction Disutrbances:right bundle branch block  Narrative Interpretation:   Old EKG Reviewed: unchanged other than PVCs   Final diagnoses:  Chronic cough      Noemi Chapel, MD 10/31/15 1032

## 2017-06-27 IMAGING — MR MR HEAD W/O CM
10 of 11 series · 38 of 48 positions shown · IV contrast (Yes   MULTIHANCE)
Comparison: 02/20/2010 CT.

CLINICAL DATA: 54-year-old hypertensive female with high
cholesterol and history of migraines. Prior VSD repair. Headache
since last night. Visual loss for 1 minutes. Initial encounter.

EXAM:
MRI HEAD WITHOUT CONTRAST
TECHNIQUE: Multiplanar, multiecho pulse sequences of the brain and surrounding
structures were obtained without intravenous contrast.

[Series 3: T1 · sagittal · 5.0mm · 0.47mm/px · 3 of 23 slices shown]
[im 1/23]
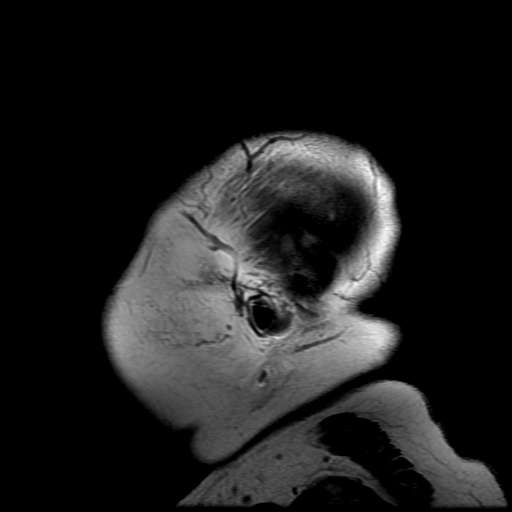
[im 12/23]
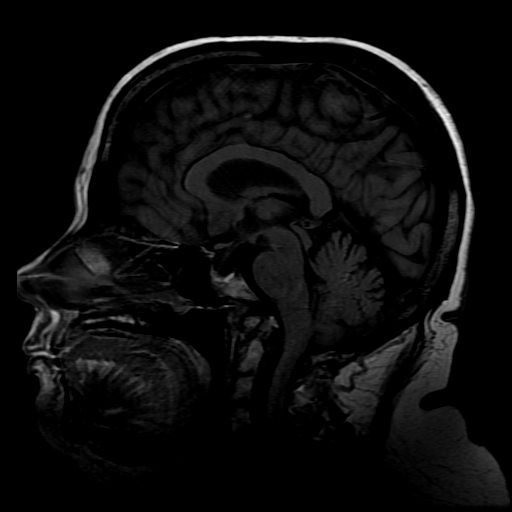
[im 23/23]
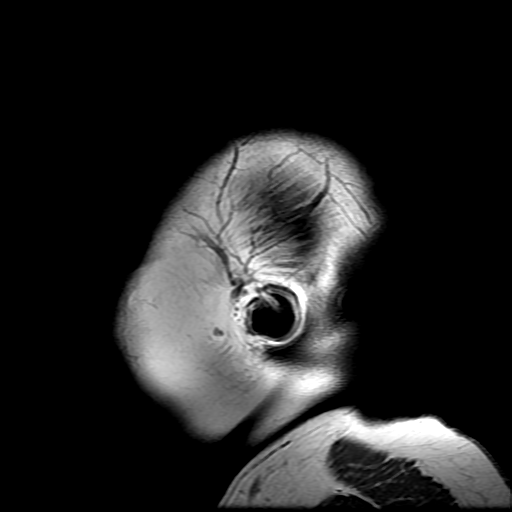

[Series 4: DWI · axial · 3.0mm · 1.09mm/px · z∈[-41,+97]mm · 10 of 94 slices shown (1 of 4)]
[im 1/94]
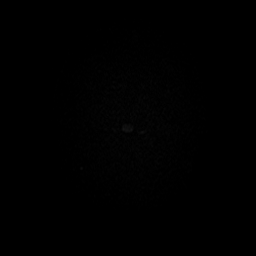
[im 11/94]
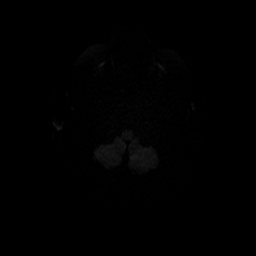
[im 21/94]
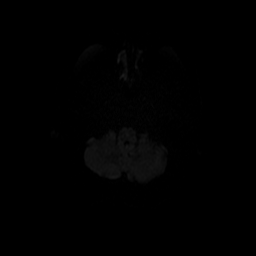
[im 32/94]
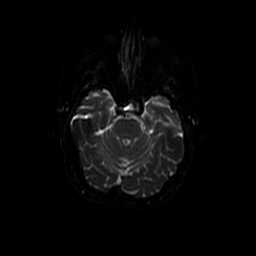
[im 42/94]
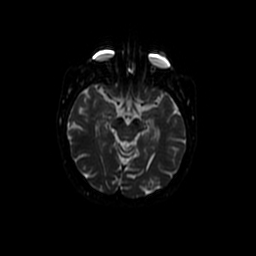
[im 52/94]
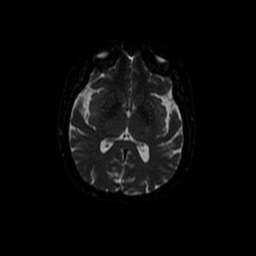
[im 63/94]
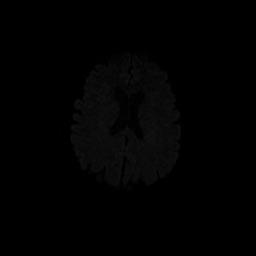
[im 73/94]
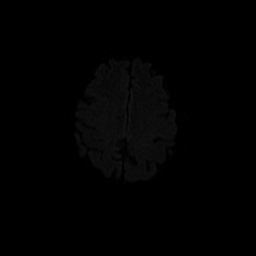
[im 83/94]
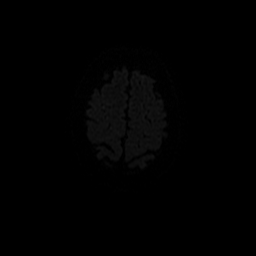
[im 94/94]
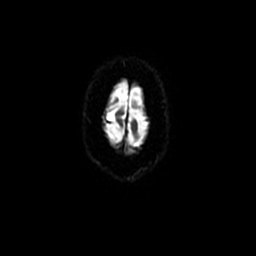

[Series 5: T2 · sagittal · 3.0mm · 0.43mm/px · 2 of 15 slices shown (1 of 3)]
[im 1/15]
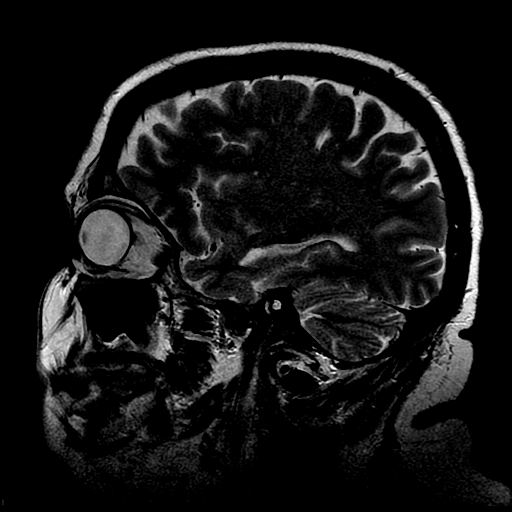
[im 15/15]
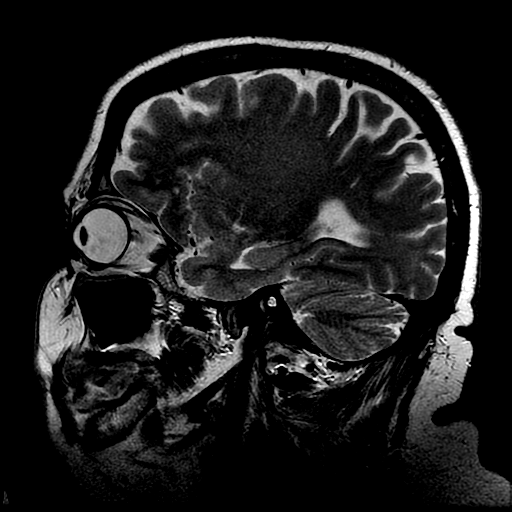

[Series 6: T2 · axial · 5.0mm · 0.43mm/px · z∈[-42,+96]mm · 2 of 24 slices shown (2 of 3)]
[im 1/24]
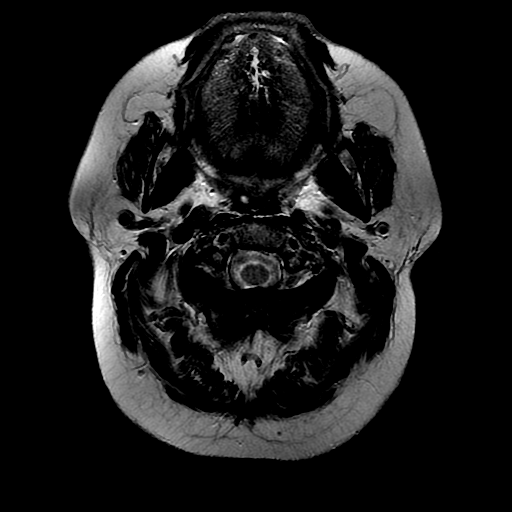
[im 24/24]
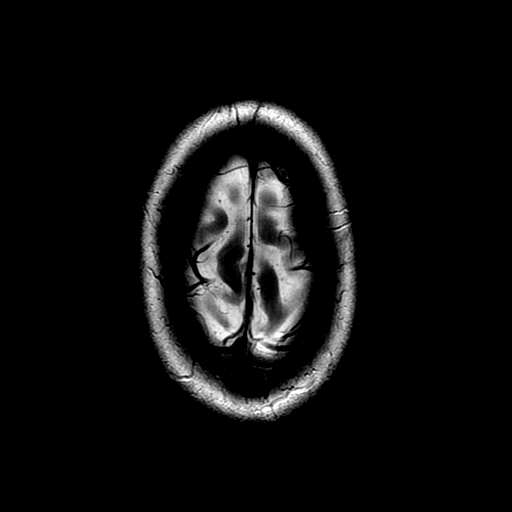

[Series 7: DWI · coronal · 5.0mm · 1.09mm/px · 6 of 62 slices shown (2 of 4)]
[im 1/62]
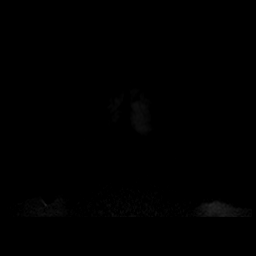
[im 13/62]
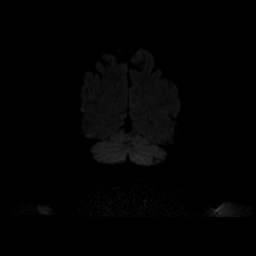
[im 25/62]
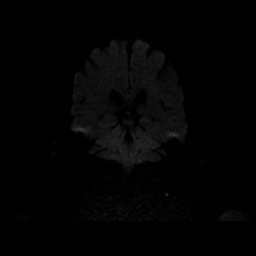
[im 37/62]
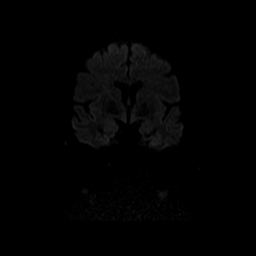
[im 49/62]
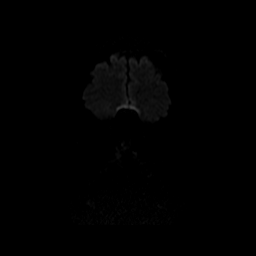
[im 62/62]
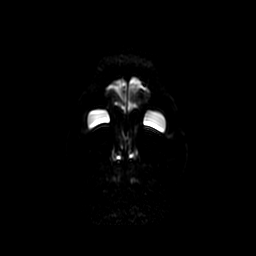

[Series 8: FLAIR · axial · 5.0mm · 0.43mm/px · z∈[-42,+96]mm · 2 of 24 slices shown]
[im 1/24]
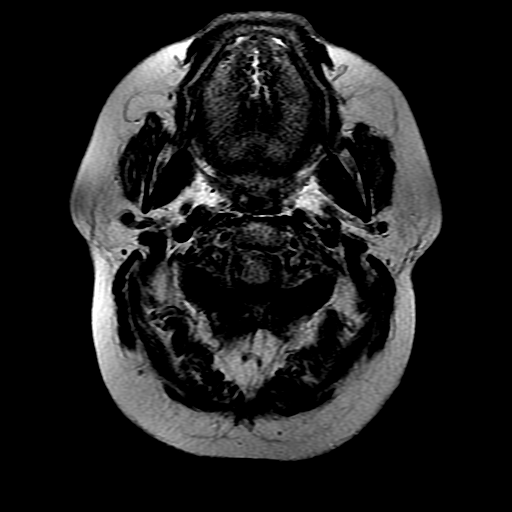
[im 24/24]
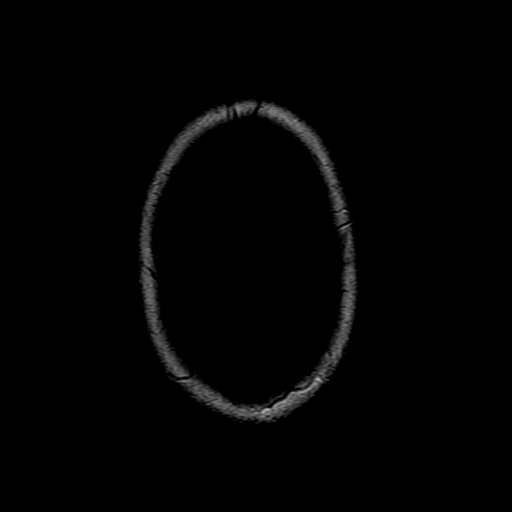

[Series 9: ax mpgr · axial · 5.0mm · 0.43mm/px · z∈[-42,+96]mm · 2 of 24 slices shown]
[im 1/24]
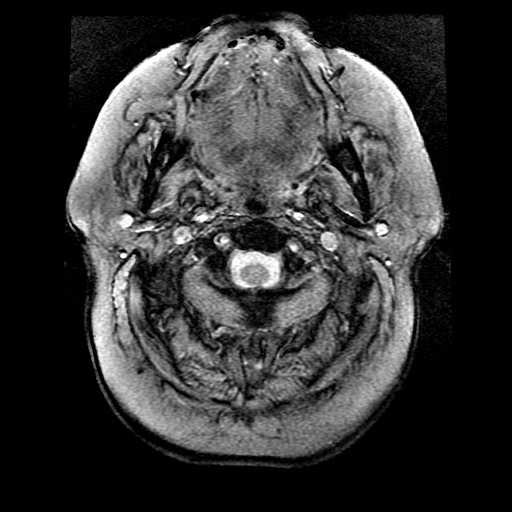
[im 24/24]
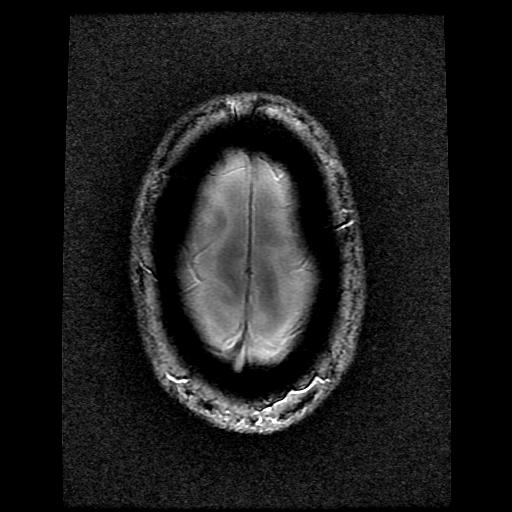

[Series 11: T2 · coronal · 5.0mm · 0.78mm/px · 3 of 29 slices shown (3 of 3)]
[im 1/29]
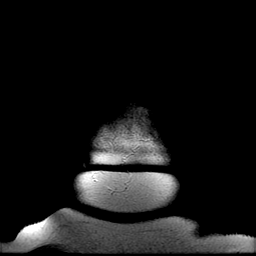
[im 15/29]
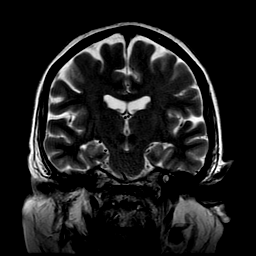
[im 29/29]
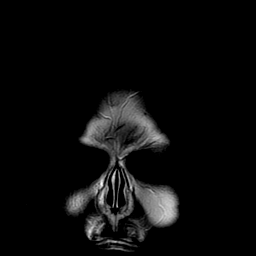

[Series 400: DWI · axial · 3.0mm · 1.09mm/px · z∈[-41,+97]mm · 5 of 47 slices shown (3 of 4)]
[im 1/47]
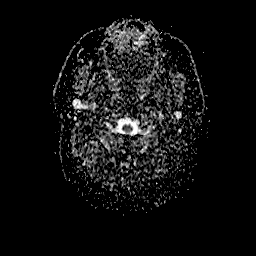
[im 12/47]
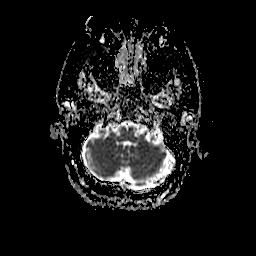
[im 24/47]
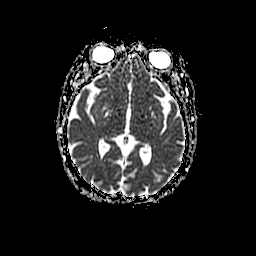
[im 35/47]
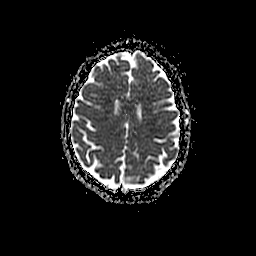
[im 47/47]
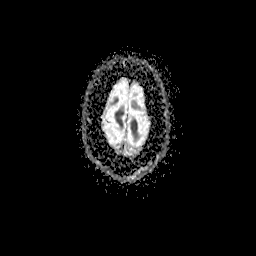

[Series 700: DWI · coronal · 5.0mm · 1.09mm/px · 3 of 31 slices shown (4 of 4)]
[im 1/31]
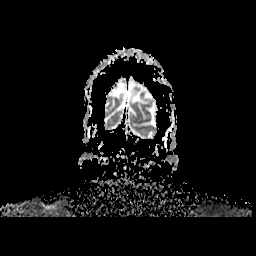
[im 16/31]
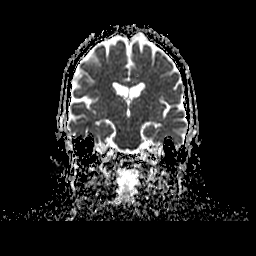
[im 31/31]
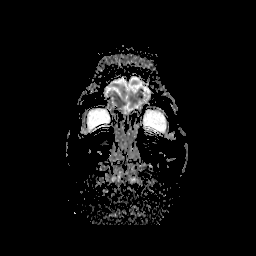

[38 of 48 positions shown; findings below may reference images not displayed]

FINDINGS: No acute infarct or intracranial hemorrhage.

No intracranial mass lesion noted on this unenhanced exam.

Mild global atrophy without hydrocephalus.

Cerebellar tonsils minimally low lying but within the range of
normal limits.

Partially empty expanded sella without flattening of the posterior
globes as seen with pseudotumor cerebri

Major intracranial vascular structures are patent with small
vertebral arteries and basilar artery.

Atypical appearance of the semi circular canals incompletely
assessed on present exam.

Minimal mucosal thickening ethmoid sinus air cells.
IMPRESSION: No acute infarct or intracranial hemorrhage.

No intracranial mass lesion noted on this unenhanced exam.

Mild global atrophy without hydrocephalus.

Cerebellar tonsils minimally low lying but within the range of
normal limits.

Partially empty expanded sella without flattening of the posterior
globes as seen with pseudotumor cerebri.

Major intracranial vascular structures are patent with small
vertebral arteries and basilar artery.

Atypical appearance of the semi circular canals incompletely
assessed on present exam.

## 2017-11-20 ENCOUNTER — Telehealth: Payer: Self-pay | Admitting: Family Medicine

## 2017-11-20 NOTE — Telephone Encounter (Signed)
Stephanie Bray has a denial letter for Clobetasol Propionate E 0.05%. Put in doctors box.

## 2017-11-29 ENCOUNTER — Emergency Department (HOSPITAL_COMMUNITY): Payer: Medicare Other

## 2017-11-29 ENCOUNTER — Encounter (HOSPITAL_COMMUNITY): Payer: Self-pay

## 2017-11-29 ENCOUNTER — Inpatient Hospital Stay (HOSPITAL_COMMUNITY)
Admission: EM | Admit: 2017-11-29 | Discharge: 2017-12-02 | DRG: 280 | Disposition: A | Discharge status: Home / Self Care | Payer: Medicare Other | Attending: Internal Medicine | Admitting: Internal Medicine

## 2017-11-29 DIAGNOSIS — R040 Epistaxis: Secondary | ICD-10-CM | POA: Diagnosis present

## 2017-11-29 DIAGNOSIS — I4510 Unspecified right bundle-branch block: Secondary | ICD-10-CM | POA: Diagnosis present

## 2017-11-29 DIAGNOSIS — K219 Gastro-esophageal reflux disease without esophagitis: Secondary | ICD-10-CM | POA: Diagnosis present

## 2017-11-29 DIAGNOSIS — E669 Obesity, unspecified: Secondary | ICD-10-CM | POA: Diagnosis present

## 2017-11-29 DIAGNOSIS — I2510 Atherosclerotic heart disease of native coronary artery without angina pectoris: Secondary | ICD-10-CM | POA: Diagnosis present

## 2017-11-29 DIAGNOSIS — Q221 Congenital pulmonary valve stenosis: Secondary | ICD-10-CM | POA: Diagnosis present

## 2017-11-29 DIAGNOSIS — Z888 Allergy status to other drugs, medicaments and biological substances status: Secondary | ICD-10-CM

## 2017-11-29 DIAGNOSIS — Z6833 Body mass index (BMI) 33.0-33.9, adult: Secondary | ICD-10-CM

## 2017-11-29 DIAGNOSIS — Q256 Stenosis of pulmonary artery: Secondary | ICD-10-CM

## 2017-11-29 DIAGNOSIS — F329 Major depressive disorder, single episode, unspecified: Secondary | ICD-10-CM | POA: Diagnosis present

## 2017-11-29 DIAGNOSIS — Q210 Ventricular septal defect: Secondary | ICD-10-CM

## 2017-11-29 DIAGNOSIS — R011 Cardiac murmur, unspecified: Secondary | ICD-10-CM | POA: Diagnosis present

## 2017-11-29 DIAGNOSIS — D509 Iron deficiency anemia, unspecified: Secondary | ICD-10-CM | POA: Diagnosis present

## 2017-11-29 DIAGNOSIS — E7800 Pure hypercholesterolemia, unspecified: Secondary | ICD-10-CM | POA: Diagnosis present

## 2017-11-29 DIAGNOSIS — F32A Depression, unspecified: Secondary | ICD-10-CM | POA: Diagnosis present

## 2017-11-29 DIAGNOSIS — F419 Anxiety disorder, unspecified: Secondary | ICD-10-CM | POA: Diagnosis present

## 2017-11-29 DIAGNOSIS — I214 Non-ST elevation (NSTEMI) myocardial infarction: Secondary | ICD-10-CM | POA: Diagnosis not present

## 2017-11-29 DIAGNOSIS — I272 Pulmonary hypertension, unspecified: Secondary | ICD-10-CM | POA: Diagnosis present

## 2017-11-29 DIAGNOSIS — I420 Dilated cardiomyopathy: Secondary | ICD-10-CM | POA: Diagnosis present

## 2017-11-29 DIAGNOSIS — I10 Essential (primary) hypertension: Secondary | ICD-10-CM | POA: Diagnosis present

## 2017-11-29 DIAGNOSIS — Z79899 Other long term (current) drug therapy: Secondary | ICD-10-CM

## 2017-11-29 DIAGNOSIS — J42 Unspecified chronic bronchitis: Secondary | ICD-10-CM | POA: Diagnosis present

## 2017-11-29 DIAGNOSIS — M1990 Unspecified osteoarthritis, unspecified site: Secondary | ICD-10-CM | POA: Diagnosis present

## 2017-11-29 DIAGNOSIS — Z8249 Family history of ischemic heart disease and other diseases of the circulatory system: Secondary | ICD-10-CM

## 2017-11-29 DIAGNOSIS — N390 Urinary tract infection, site not specified: Secondary | ICD-10-CM | POA: Diagnosis present

## 2017-11-29 DIAGNOSIS — E785 Hyperlipidemia, unspecified: Secondary | ICD-10-CM | POA: Diagnosis present

## 2017-11-29 DIAGNOSIS — Z8673 Personal history of transient ischemic attack (TIA), and cerebral infarction without residual deficits: Secondary | ICD-10-CM

## 2017-11-29 DIAGNOSIS — E119 Type 2 diabetes mellitus without complications: Secondary | ICD-10-CM

## 2017-11-29 DIAGNOSIS — R350 Frequency of micturition: Secondary | ICD-10-CM | POA: Diagnosis present

## 2017-11-29 DIAGNOSIS — Z7984 Long term (current) use of oral hypoglycemic drugs: Secondary | ICD-10-CM

## 2017-11-29 LAB — CBC
HEMATOCRIT: 39.3 % (ref 36.0–46.0)
Hemoglobin: 11.8 g/dL — ABNORMAL LOW (ref 12.0–15.0)
MCH: 23.1 pg — AB (ref 26.0–34.0)
MCHC: 30 g/dL (ref 30.0–36.0)
MCV: 77.1 fL — ABNORMAL LOW (ref 78.0–100.0)
Platelets: 256 10*3/uL (ref 150–400)
RBC: 5.1 MIL/uL (ref 3.87–5.11)
RDW: 15.9 % — AB (ref 11.5–15.5)
WBC: 9.7 10*3/uL (ref 4.0–10.5)

## 2017-11-29 LAB — I-STAT TROPONIN, ED: Troponin i, poc: 0.09 ng/mL (ref 0.00–0.08)

## 2017-11-29 LAB — BASIC METABOLIC PANEL
Anion gap: 10 (ref 5–15)
BUN: 14 mg/dL (ref 6–20)
CALCIUM: 9.4 mg/dL (ref 8.9–10.3)
CO2: 25 mmol/L (ref 22–32)
Chloride: 104 mmol/L (ref 98–111)
Creatinine, Ser: 0.56 mg/dL (ref 0.44–1.00)
GFR calc Af Amer: >60 mL/min (ref >60)
GLUCOSE: 130 mg/dL — AB (ref 70–99)
Potassium: 3.6 mmol/L (ref 3.5–5.1)
Sodium: 139 mmol/L (ref 135–145)

## 2017-11-29 LAB — I-STAT BETA HCG BLOOD, ED (MC, WL, AP ONLY)

## 2017-11-29 NOTE — ED Notes (Signed)
ED Provider at bedside. 

## 2017-11-29 NOTE — ED Notes (Signed)
Elevated trop, notified Dr. Ashok Cordia

## 2017-11-29 NOTE — ED Triage Notes (Signed)
Patient arrives from home via EMS with c/o epistaxis x 4 days; pt has frequent nose bleeds and bleeding has stopped prior to arrival. Pt has hx of HTN, intinal BP with EMS 180/76; en route pt c/o chest pain and dizziness; pt denies cardiac hx pt has had Nitro and 325 mg of asa en route; pt a&x 4  On arrival-Monique,RN

## 2017-11-30 ENCOUNTER — Inpatient Hospital Stay (HOSPITAL_COMMUNITY): Payer: Medicare Other

## 2017-11-30 DIAGNOSIS — Q210 Ventricular septal defect: Secondary | ICD-10-CM

## 2017-11-30 DIAGNOSIS — R040 Epistaxis: Secondary | ICD-10-CM | POA: Diagnosis present

## 2017-11-30 DIAGNOSIS — D509 Iron deficiency anemia, unspecified: Secondary | ICD-10-CM | POA: Diagnosis present

## 2017-11-30 DIAGNOSIS — F419 Anxiety disorder, unspecified: Secondary | ICD-10-CM | POA: Diagnosis present

## 2017-11-30 DIAGNOSIS — I4510 Unspecified right bundle-branch block: Secondary | ICD-10-CM | POA: Diagnosis present

## 2017-11-30 DIAGNOSIS — J42 Unspecified chronic bronchitis: Secondary | ICD-10-CM | POA: Diagnosis present

## 2017-11-30 DIAGNOSIS — I420 Dilated cardiomyopathy: Secondary | ICD-10-CM | POA: Diagnosis present

## 2017-11-30 DIAGNOSIS — Q256 Stenosis of pulmonary artery: Secondary | ICD-10-CM | POA: Diagnosis not present

## 2017-11-30 DIAGNOSIS — Z8673 Personal history of transient ischemic attack (TIA), and cerebral infarction without residual deficits: Secondary | ICD-10-CM | POA: Diagnosis not present

## 2017-11-30 DIAGNOSIS — I10 Essential (primary) hypertension: Secondary | ICD-10-CM | POA: Diagnosis present

## 2017-11-30 DIAGNOSIS — E7800 Pure hypercholesterolemia, unspecified: Secondary | ICD-10-CM | POA: Diagnosis present

## 2017-11-30 DIAGNOSIS — I214 Non-ST elevation (NSTEMI) myocardial infarction: Secondary | ICD-10-CM

## 2017-11-30 DIAGNOSIS — E669 Obesity, unspecified: Secondary | ICD-10-CM | POA: Diagnosis present

## 2017-11-30 DIAGNOSIS — N390 Urinary tract infection, site not specified: Secondary | ICD-10-CM | POA: Diagnosis present

## 2017-11-30 DIAGNOSIS — E785 Hyperlipidemia, unspecified: Secondary | ICD-10-CM | POA: Diagnosis present

## 2017-11-30 DIAGNOSIS — R350 Frequency of micturition: Secondary | ICD-10-CM | POA: Diagnosis present

## 2017-11-30 DIAGNOSIS — M1990 Unspecified osteoarthritis, unspecified site: Secondary | ICD-10-CM | POA: Diagnosis present

## 2017-11-30 DIAGNOSIS — Z6833 Body mass index (BMI) 33.0-33.9, adult: Secondary | ICD-10-CM | POA: Diagnosis not present

## 2017-11-30 DIAGNOSIS — Q221 Congenital pulmonary valve stenosis: Secondary | ICD-10-CM | POA: Diagnosis present

## 2017-11-30 DIAGNOSIS — I272 Pulmonary hypertension, unspecified: Secondary | ICD-10-CM | POA: Diagnosis present

## 2017-11-30 DIAGNOSIS — F329 Major depressive disorder, single episode, unspecified: Secondary | ICD-10-CM | POA: Diagnosis present

## 2017-11-30 DIAGNOSIS — E119 Type 2 diabetes mellitus without complications: Secondary | ICD-10-CM | POA: Diagnosis present

## 2017-11-30 DIAGNOSIS — R011 Cardiac murmur, unspecified: Secondary | ICD-10-CM | POA: Diagnosis present

## 2017-11-30 DIAGNOSIS — I5030 Unspecified diastolic (congestive) heart failure: Secondary | ICD-10-CM | POA: Diagnosis not present

## 2017-11-30 DIAGNOSIS — I2510 Atherosclerotic heart disease of native coronary artery without angina pectoris: Secondary | ICD-10-CM | POA: Diagnosis present

## 2017-11-30 DIAGNOSIS — K219 Gastro-esophageal reflux disease without esophagitis: Secondary | ICD-10-CM

## 2017-11-30 LAB — GLUCOSE, CAPILLARY
GLUCOSE-CAPILLARY: 136 mg/dL — AB (ref 70–99)
GLUCOSE-CAPILLARY: 127 mg/dL — AB (ref 70–99)
Glucose-Capillary: 139 mg/dL — ABNORMAL HIGH (ref 70–99)

## 2017-11-30 LAB — URINALYSIS, ROUTINE W REFLEX MICROSCOPIC
BILIRUBIN URINE: NEGATIVE
Bacteria, UA: NONE SEEN
Glucose, UA: NEGATIVE mg/dL
Hgb urine dipstick: NEGATIVE
KETONES UR: 20 mg/dL — AB
Nitrite: NEGATIVE
Protein, ur: NEGATIVE mg/dL
SPECIFIC GRAVITY, URINE: 1.027 (ref 1.005–1.030)
WBC, UA: >50 WBC/hpf — ABNORMAL HIGH (ref 0–5)
pH: 5 (ref 5.0–8.0)

## 2017-11-30 LAB — CBC
HCT: 40 % (ref 36.0–46.0)
Hemoglobin: 11.8 g/dL — ABNORMAL LOW (ref 12.0–15.0)
MCH: 22.8 pg — ABNORMAL LOW (ref 26.0–34.0)
MCHC: 29.5 g/dL — ABNORMAL LOW (ref 30.0–36.0)
MCV: 77.2 fL — ABNORMAL LOW (ref 78.0–100.0)
Platelets: 263 10*3/uL (ref 150–400)
RBC: 5.18 MIL/uL — ABNORMAL HIGH (ref 3.87–5.11)
RDW: 16 % — AB (ref 11.5–15.5)
WBC: 12.6 10*3/uL — ABNORMAL HIGH (ref 4.0–10.5)

## 2017-11-30 LAB — PROTIME-INR
INR: 1.09
Prothrombin Time: 14 seconds (ref 11.4–15.2)

## 2017-11-30 LAB — LIPID PANEL
CHOLESTEROL: 169 mg/dL (ref 0–200)
HDL: 57 mg/dL (ref >40)
LDL Cholesterol: 105 mg/dL — ABNORMAL HIGH (ref 0–99)
TRIGLYCERIDES: 37 mg/dL (ref <150)
Total CHOL/HDL Ratio: 3 RATIO
VLDL: 7 mg/dL (ref 0–40)

## 2017-11-30 LAB — RAPID URINE DRUG SCREEN, HOSP PERFORMED
Amphetamines: NOT DETECTED
BENZODIAZEPINES: NOT DETECTED
Barbiturates: NOT DETECTED
Cocaine: NOT DETECTED
Opiates: POSITIVE — AB
TETRAHYDROCANNABINOL: NOT DETECTED

## 2017-11-30 LAB — TROPONIN I
TROPONIN I: 1.03 ng/mL — AB (ref <0.03)
TROPONIN I: 0.49 ng/mL — AB (ref <0.03)
Troponin I: 0.81 ng/mL (ref <0.03)
Troponin I: 1.58 ng/mL (ref <0.03)

## 2017-11-30 LAB — APTT: aPTT: 120 seconds — ABNORMAL HIGH (ref 24–36)

## 2017-11-30 LAB — HEMOGLOBIN A1C
HEMOGLOBIN A1C: 6.4 % — AB (ref 4.8–5.6)
MEAN PLASMA GLUCOSE: 136.98 mg/dL

## 2017-11-30 LAB — MRSA PCR SCREENING: MRSA BY PCR: NEGATIVE

## 2017-11-30 LAB — BRAIN NATRIURETIC PEPTIDE: B NATRIURETIC PEPTIDE 5: 112.3 pg/mL — AB (ref 0.0–100.0)

## 2017-11-30 LAB — HEPARIN LEVEL (UNFRACTIONATED)
HEPARIN UNFRACTIONATED: 0.14 [IU]/mL — AB (ref 0.30–0.70)
Heparin Unfractionated: 0.17 IU/mL — ABNORMAL LOW (ref 0.30–0.70)

## 2017-11-30 LAB — ECHOCARDIOGRAM COMPLETE
HEIGHTINCHES: 58 in
WEIGHTICAEL: 2620.83 [oz_av]

## 2017-11-30 LAB — HIV ANTIBODY (ROUTINE TESTING W REFLEX): HIV SCREEN 4TH GENERATION: NONREACTIVE

## 2017-11-30 MED ORDER — BUSPIRONE HCL 10 MG PO TABS
10.0000 mg | ORAL_TABLET | Freq: Three times a day (TID) | ORAL | Status: DC
  Administered 2017-11-30 – 2017-12-02 (×6): 10 mg via ORAL
  Filled 2017-11-30 (×7): qty 1

## 2017-11-30 MED ORDER — DARIFENACIN HYDROBROMIDE ER 7.5 MG PO TB24
7.5000 mg | ORAL_TABLET | Freq: Every day | ORAL | Status: DC
  Administered 2017-11-30 – 2017-12-02 (×2): 7.5 mg via ORAL
  Filled 2017-11-30 (×3): qty 1

## 2017-11-30 MED ORDER — ACETAMINOPHEN 325 MG PO TABS
650.0000 mg | ORAL_TABLET | ORAL | Status: DC | PRN
  Administered 2017-11-30 – 2017-12-02 (×5): 650 mg via ORAL
  Filled 2017-11-30 (×5): qty 2

## 2017-11-30 MED ORDER — MORPHINE SULFATE (PF) 4 MG/ML IV SOLN
2.0000 mg | INTRAVENOUS | Status: DC | PRN
  Administered 2017-11-30: 2 mg via INTRAVENOUS
  Filled 2017-11-30: qty 1

## 2017-11-30 MED ORDER — ONDANSETRON HCL 4 MG/2ML IJ SOLN
4.0000 mg | Freq: Four times a day (QID) | INTRAMUSCULAR | Status: DC | PRN
  Administered 2017-11-30 – 2017-12-01 (×4): 4 mg via INTRAVENOUS
  Filled 2017-11-30 (×4): qty 2

## 2017-11-30 MED ORDER — TRAZODONE HCL 100 MG PO TABS
200.0000 mg | ORAL_TABLET | Freq: Every evening | ORAL | Status: DC | PRN
  Filled 2017-11-30: qty 2

## 2017-11-30 MED ORDER — SODIUM CHLORIDE 0.9 % IV SOLN
1.0000 g | INTRAVENOUS | Status: DC
  Administered 2017-11-30 – 2017-12-02 (×3): 1 g via INTRAVENOUS
  Filled 2017-11-30 (×3): qty 10

## 2017-11-30 MED ORDER — INSULIN ASPART 100 UNIT/ML ~~LOC~~ SOLN: 0.0000 [IU] | Freq: Every day | SUBCUTANEOUS | Status: DC

## 2017-11-30 MED ORDER — NITROGLYCERIN 2 % TD OINT
1.0000 [in_us] | TOPICAL_OINTMENT | Freq: Four times a day (QID) | TRANSDERMAL | Status: DC
  Administered 2017-11-30 – 2017-12-01 (×6): 1 [in_us] via TOPICAL
  Filled 2017-11-30: qty 30
  Filled 2017-11-30: qty 1

## 2017-11-30 MED ORDER — ALPRAZOLAM 0.25 MG PO TABS
0.2500 mg | ORAL_TABLET | Freq: Two times a day (BID) | ORAL | Status: DC | PRN
  Administered 2017-11-30 (×2): 0.25 mg via ORAL
  Filled 2017-11-30 (×2): qty 1

## 2017-11-30 MED ORDER — ATORVASTATIN CALCIUM 80 MG PO TABS
80.0000 mg | ORAL_TABLET | Freq: Every day | ORAL | Status: DC
  Administered 2017-11-30 – 2017-12-01 (×2): 80 mg via ORAL
  Filled 2017-11-30 (×2): qty 1

## 2017-11-30 MED ORDER — SODIUM CHLORIDE 0.9% FLUSH: 3.0000 mL | Freq: Two times a day (BID) | INTRAVENOUS | Status: DC

## 2017-11-30 MED ORDER — PANTOPRAZOLE SODIUM 40 MG PO TBEC
40.0000 mg | DELAYED_RELEASE_TABLET | Freq: Every day | ORAL | Status: DC
  Administered 2017-11-30 – 2017-12-02 (×2): 40 mg via ORAL
  Filled 2017-11-30 (×2): qty 1

## 2017-11-30 MED ORDER — NITROGLYCERIN 0.4 MG SL SUBL: 0.4000 mg | SUBLINGUAL_TABLET | SUBLINGUAL | Status: DC | PRN

## 2017-11-30 MED ORDER — FERROUS SULFATE 325 (65 FE) MG PO TABS
325.0000 mg | ORAL_TABLET | ORAL | Status: DC
  Administered 2017-11-30 – 2017-12-02 (×2): 325 mg via ORAL
  Filled 2017-11-30 (×2): qty 1

## 2017-11-30 MED ORDER — METOPROLOL TARTRATE 25 MG PO TABS
25.0000 mg | ORAL_TABLET | Freq: Every day | ORAL | Status: DC
  Administered 2017-11-30: 25 mg via ORAL
  Filled 2017-11-30: qty 1

## 2017-11-30 MED ORDER — PNEUMOCOCCAL VAC POLYVALENT 25 MCG/0.5ML IJ INJ
0.5000 mL | INJECTION | INTRAMUSCULAR | Status: DC
Start: 2017-12-01 — End: 2017-12-02

## 2017-11-30 MED ORDER — ASPIRIN 325 MG PO TABS
325.0000 mg | ORAL_TABLET | Freq: Every day | ORAL | Status: DC
  Filled 2017-11-30: qty 1

## 2017-11-30 MED ORDER — HEPARIN (PORCINE) IN NACL 100-0.45 UNIT/ML-% IJ SOLN
1050.0000 [IU]/h | INTRAMUSCULAR | Status: DC
  Administered 2017-11-30: 700 [IU]/h via INTRAVENOUS
  Administered 2017-12-01: 1050 [IU]/h via INTRAVENOUS
  Filled 2017-11-30 (×2): qty 250

## 2017-11-30 MED ORDER — SODIUM CHLORIDE 0.9 % WEIGHT BASED INFUSION
1.0000 mL/kg/h | INTRAVENOUS | Status: DC
  Administered 2017-12-01: 1 mL/kg/h via INTRAVENOUS

## 2017-11-30 MED ORDER — ZOLPIDEM TARTRATE 5 MG PO TABS: 5.0000 mg | ORAL_TABLET | Freq: Every evening | ORAL | Status: DC | PRN

## 2017-11-30 MED ORDER — OMEGA-3-ACID ETHYL ESTERS 1 G PO CAPS
1.0000 g | ORAL_CAPSULE | Freq: Every day | ORAL | Status: DC
  Administered 2017-11-30 – 2017-12-02 (×2): 1 g via ORAL
  Filled 2017-11-30 (×2): qty 1

## 2017-11-30 MED ORDER — SODIUM CHLORIDE 0.9 % WEIGHT BASED INFUSION
3.0000 mL/kg/h | INTRAVENOUS | Status: DC
  Administered 2017-12-01: 3 mL/kg/h via INTRAVENOUS

## 2017-11-30 MED ORDER — SODIUM CHLORIDE 0.9 % IV SOLN: 250.0000 mL | INTRAVENOUS | Status: DC | PRN

## 2017-11-30 MED ORDER — HYDRALAZINE HCL 20 MG/ML IJ SOLN: 5.0000 mg | INTRAMUSCULAR | Status: DC | PRN

## 2017-11-30 MED ORDER — PERFLUTREN LIPID MICROSPHERE
1.0000 mL | INTRAVENOUS | Status: AC | PRN
  Administered 2017-11-30: 2 mL via INTRAVENOUS
  Filled 2017-11-30: qty 10

## 2017-11-30 MED ORDER — SODIUM CHLORIDE 0.9% FLUSH: 3.0000 mL | INTRAVENOUS | Status: DC | PRN

## 2017-11-30 MED ORDER — INSULIN ASPART 100 UNIT/ML ~~LOC~~ SOLN
0.0000 [IU] | Freq: Three times a day (TID) | SUBCUTANEOUS | Status: DC
  Administered 2017-11-30 – 2017-12-01 (×3): 1 [IU] via SUBCUTANEOUS

## 2017-11-30 MED ORDER — HEPARIN BOLUS VIA INFUSION
3000.0000 [IU] | Freq: Once | INTRAVENOUS | Status: AC
  Administered 2017-11-30: 3000 [IU] via INTRAVENOUS
  Filled 2017-11-30: qty 3000

## 2017-11-30 MED ORDER — NITROGLYCERIN IN D5W 200-5 MCG/ML-% IV SOLN: 0.0000 ug/min | INTRAVENOUS | Status: DC

## 2017-11-30 MED ORDER — SALINE SPRAY 0.65 % NA SOLN
1.0000 | NASAL | Status: DC | PRN
  Filled 2017-11-30: qty 44

## 2017-11-30 MED ORDER — ASPIRIN 81 MG PO CHEW
81.0000 mg | CHEWABLE_TABLET | Freq: Every day | ORAL | Status: DC
  Administered 2017-11-30 – 2017-12-02 (×3): 81 mg via ORAL
  Filled 2017-11-30 (×4): qty 1

## 2017-11-30 NOTE — Progress Notes (Signed)
ANTICOAGULATION CONSULT NOTE - Initial Consult  Pharmacy Consult for heparin Indication: chest pain/ACS  Allergies  Allergen Reactions  . Other Other (See Comments)    Reaction unknown, per son  . Ace Inhibitors     Other reaction(s): Cough    Patient Measurements: Height: 4\' 10"  (147.3 cm) Weight: 162 lb (73.5 kg) IBW/kg (Calculated) : 40.9 Heparin Dosing Weight: 57.8 kg  Vital Signs: Temp: 98.8 F (37.1 C) (07/28 0041) Temp Source: Oral (07/28 0041) BP: 123/53 (07/28 0030) Pulse Rate: 81 (07/28 0030)  Labs: Recent Labs    11/29/17 2244 11/30/17 0025  HGB 11.8*  --   HCT 39.3  --   PLT 256  --   CREATININE 0.56  --   TROPONINI  --  0.81*    Estimated Creatinine Clearance: 66.8 mL/min (by C-G formula based on SCr of 0.56 mg/dL).   Medical History: Past Medical History:  Diagnosis Date  . Anxiety   . Arthritis    "left hip" (04/25/2014)  . Chest pain    a. 04/2014 MV: small anteroapical perfusion defect - likely breast attenuation->low risk.  . Chronic bronchitis ([place])    "get it mostly q yr" (04/25/2014)  . Depression   . Essential hypertension   . GERD (gastroesophageal reflux disease)   . Heart murmur   . High cholesterol   . Iron deficiency anemia   . Migraine    "weekly, at least" (04/25/2014)  . VSD (ventricular septal defect)    a. 04/2014 Echo: EF 55-60%, PASP 24mmHg, small restrictive either supracristal or perimembranous VSD w/o PAH.    Medications:  See medication history  Assessment: 56 yo lady to start heparin for CP.  She was not on anticoagulation PTA. Goal of Therapy:  Heparin level 0.3-0.7 units/ml Monitor platelets by anticoagulation protocol: Yes   Plan:  Heparin bolus 3000 units and drip at 700 units/hr Check heparin level and CBC 6-8 hours after start then daily while on heparin Monitor for bleeding complications  Seith Aikey Poteet 11/30/2017,1:35 AM

## 2017-11-30 NOTE — Progress Notes (Signed)
ANTICOAGULATION CONSULT NOTE - Follow Up Consult  Pharmacy Consult for heparin Indication: chest pain/ACS  Allergies  Allergen Reactions  . Other Other (See Comments)    Reaction unknown, per son  . Ace Inhibitors     Other reaction(s): Cough    Patient Measurements: Height: 4\' 10"  (147.3 cm) Weight: 163 lb 12.8 oz (74.3 kg) IBW/kg (Calculated) : 40.9 Heparin Dosing Weight: 57.8 kg  Vital Signs: Temp: 99 F (37.2 C) (07/28 1100) Temp Source: Oral (07/28 1100) BP: 123/70 (07/28 1100) Pulse Rate: 85 (07/28 1100)  Labs: Recent Labs    11/29/17 2244 11/30/17 0025 11/30/17 0331 11/30/17 0857  HGB 11.8*  --  11.8*  --   HCT 39.3  --  40.0  --   PLT 256  --  263  --   APTT  --   --  120*  --   LABPROT  --   --  14.0  --   INR  --   --  1.09  --   HEPARINUNFRC  --   --   --  0.17*  CREATININE 0.56  --   --   --   TROPONINI  --  0.81* 1.58* 1.03*    Estimated Creatinine Clearance: 67.3 mL/min (by C-G formula based on SCr of 0.56 mg/dL).   Medical History: Past Medical History:  Diagnosis Date  . Anxiety   . Arthritis    "left hip" (04/25/2014)  . Chest pain    a. 04/2014 MV: small anteroapical perfusion defect - likely breast attenuation->low risk.  . Chronic bronchitis ([place])    "get it mostly q yr" (04/25/2014)  . Depression   . Essential hypertension   . GERD (gastroesophageal reflux disease)   . Heart murmur   . High cholesterol   . Iron deficiency anemia   . Migraine    "weekly, at least" (04/25/2014)  . VSD (ventricular septal defect)    a. 04/2014 Echo: EF 55-60%, PASP 24mmHg, small restrictive either supracristal or perimembranous VSD w/o PAH.     Assessment: 57 yo lady to start heparin for CP.  She was not on anticoagulation PTA. Heparin drip 700 uts/hr HL 0.16 < goal no bleeding, h/h stable.   Cath tomorrow  Goal of Therapy:  Heparin level 0.3-0.7 units/ml Monitor platelets by anticoagulation protocol: Yes   Plan:  Heparin drip 900  units/hr Daily HL, CBC,   Stephanie Bray Pharm.D. CPP, BCPS Clinical Pharmacist [phone] 11/30/2017 2:21 PM

## 2017-11-30 NOTE — ED Notes (Signed)
Heparin verified with [facility]

## 2017-11-30 NOTE — ED Provider Notes (Addendum)
[facility] EMERGENCY DEPARTMENT Provider Note   CSN: [id number] Arrival date & time: 11/29/17  2205     History   Chief Complaint Chief Complaint  Patient presents with  . Epistaxis    x 4 days  . Dizziness  . Chest Pain    HPI Stephanie Bray is a 56 y.o. female.  HPI 57 year old female with history of VSD, diabetes, hypertension and obesity comes in with chief complaint of bloody nose and chest tightness.  Patient states that she has been having intermittent episodes of nasal bleeds from her left nare over the past few days.  The episodes are unprovoked, and they resolved with pressure.  Patient denies any history of similar symptoms in the past.  She also denies any URI-like symptoms or being on any antiplatelet or anticoagulants..  Additionally, patient reports that in route to the ED she started having left-sided chest tightness.  Although patient has never been diagnosed with CAD, it seems like she has been given a prescription of nitroglycerin by her previous PCP.  Patient was given nitroglycerin along with full dose of aspirin by EMS, and she reports that her chest tightness did improve after nitro.  The patient has history of anxiety, however she typically does not get chest tightness with her anxiety.  Patient denies any associated shortness of breath, nausea, diaphoresis.  Past Medical History:  Diagnosis Date  . Anxiety   . Arthritis    "left hip" (04/25/2014)  . Chest pain    a. 04/2014 MV: small anteroapical perfusion defect - likely breast attenuation->low risk.  . Chronic bronchitis ([place])    "get it mostly q yr" (04/25/2014)  . Depression   . Essential hypertension   . GERD (gastroesophageal reflux disease)   . Heart murmur   . High cholesterol   . Iron deficiency anemia   . Migraine    "weekly, at least" (04/25/2014)  . VSD (ventricular septal defect)    a. 04/2014 Echo: EF 55-60%, PASP 66mmHg, small restrictive either supracristal or  perimembranous VSD w/o PAH.    Patient Active Problem List   Diagnosis Date Noted  . NSTEMI (non-ST elevated myocardial infarction) ([place]) 11/30/2017  . Iron deficiency anemia 11/30/2017  . GERD (gastroesophageal reflux disease) 11/30/2017  . Essential hypertension 11/30/2017  . Epistaxis 11/30/2017  . Intractable chronic migraine without aura and with status migrainosus   . Neck muscle spasm   . Cephalalgia   . Blurred vision   . Near syncope 07/18/2015  . TIA (transient ischemic attack) 07/18/2015  . Left shoulder pain 07/18/2015  . Diabetes mellitus without complication ([place]) 84/16/6063  . HLD (hyperlipidemia) 07/18/2015  . Acute loss of vision   . Bradycardia 04/26/2014  . Chest pain 04/25/2014  . SEIZURE DISORDER 11/17/2007  . Anxiety state 09/06/2006  . Depression 09/06/2006  . ALLERGIC RHINITIS 09/06/2006  . 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 HEADACHE 09/06/2006  . SYMPTOM, INCONTINENCE, URINARY NOS 09/06/2006  . SEIZURES, HX OF 09/06/2006    Past Surgical History:  Procedure Laterality Date  . CHOLECYSTECTOMY    . VSD REPAIR  ~ 1963   [facility]/notes 04/25/2014     OB History   None      Home Medications    Prior to Admission medications   Medication Sig Start Date End Date Taking? Authorizing Provider  busPIRone (BUSPAR) 10 MG tablet Take 10 mg by mouth 3 (three) times daily.   Yes [provider]  Ferrous Sulfate (IRON)  325 (65 Fe) MG TABS Take 325 mg by mouth every other day.    Yes [provider]  metFORMIN (GLUCOPHAGE-XR) 500 MG 24 hr tablet Take 500 mg by mouth daily with breakfast.  10/26/15  Yes [provider]  metoprolol tartrate (LOPRESSOR) 25 MG tablet Take 25 mg by mouth daily.  10/11/15  Yes [provider]  nitroGLYCERIN (NITROSTAT) 0.4 MG SL tablet Place 1 tablet (0.4 mg total) under the tongue every 5 (five) minutes as needed for chest pain. 04/26/14  Yes Rumley, Ostrander N, DO  Omega-3 Fatty  Acids (FISH OIL) 1000 MG CAPS Take 1,000 mg by mouth daily.   Yes [provider]  omeprazole (PRILOSEC) 20 MG capsule Take 20 mg by mouth daily.    Yes [provider]  simvastatin (ZOCOR) 10 MG tablet Take 10 mg by mouth daily.   Yes [provider]  traZODone (DESYREL) 100 MG tablet Take 200 mg by mouth at bedtime as needed for sleep.    Yes [provider]  VESICARE 5 MG tablet Take 5 mg by mouth. 10/11/15  Yes [provider]  benzonatate (TESSALON) 100 MG capsule Take 1 capsule (100 mg total) by mouth every 8 (eight) hours. Patient not taking: Reported on 11/30/2017 10/30/15   Karma Greaser, MD  fluticasone [facility]) 50 MCG/ACT nasal spray Place 2 sprays into both nostrils daily. Patient not taking: Reported on 11/30/2017 10/30/15   Karma Greaser, MD    Family History Family History  Problem Relation Age of Onset  . COPD Mother   . Hypertension Father   . Hyperlipidemia Father     Social History Social History   Tobacco Use  . Smoking status: Never Smoker  . Smokeless tobacco: Never Used  Substance Use Topics  . Alcohol use: No  . Drug use: No     Allergies   Other and Ace inhibitors   Review of Systems Review of Systems  Constitutional: Positive for activity change.  HENT: Positive for nosebleeds.   Respiratory: Positive for chest tightness. Negative for shortness of breath.   Cardiovascular: Positive for chest pain.  Skin: Negative for rash.  Neurological: Positive for dizziness.  Hematological: Does not bruise/bleed easily.     Physical Exam Updated Vital Signs BP 129/77   Pulse 100   Temp (S) 98.8 F (37.1 C) (Oral) Comment: patient states she felt hot  Resp (!) 21   Ht 4\' 10"  (1.473 m)   Wt 73.5 kg (162 lb)   SpO2 97%   BMI 33.86 kg/m   Physical Exam  Constitutional: She is oriented to person, place, and time. She appears well-developed.  HENT:  Head: Normocephalic and atraumatic.  Eyes:  EOM are normal.  Neck: Normal range of motion. Neck supple.  Cardiovascular: Normal rate and normal pulses. An irregularly irregular rhythm present.  Pulmonary/Chest: Effort normal.  Abdominal: Bowel sounds are normal.  Neurological: She is alert and oriented to person, place, and time.  Skin: Skin is warm and dry.  Nursing note and vitals reviewed.    ED Treatments / Results  Labs (all labs ordered are listed, but only abnormal results are displayed) Labs Reviewed  BASIC METABOLIC PANEL - Abnormal; Notable for the following components:      Result Value   Glucose, Bld 130 (*)    All other components within normal limits  CBC - Abnormal; Notable for the following components:   Hemoglobin 11.8 (*)    MCV 77.1 (*)  MCH 23.1 (*)    RDW 15.9 (*)    All other components within normal limits  URINALYSIS, ROUTINE W REFLEX MICROSCOPIC - Abnormal; Notable for the following components:   APPearance HAZY (*)    Ketones, ur 20 (*)    Leukocytes, UA MODERATE (*)    WBC, UA >50 (*)    All other components within normal limits  TROPONIN I - Abnormal; Notable for the following components:   Troponin I 0.81 (*)    All other components within normal limits  I-STAT TROPONIN, ED - Abnormal; Notable for the following components:   Troponin i, poc 0.09 (*)    All other components within normal limits  CULTURE, BLOOD (ROUTINE X 2)  CULTURE, BLOOD (ROUTINE X 2)  URINE CULTURE  HEPARIN LEVEL (UNFRACTIONATED)  CBC  BRAIN NATRIURETIC PEPTIDE  HEMOGLOBIN A1C  LIPID PANEL  TROPONIN I  TROPONIN I  TROPONIN I  HIV ANTIBODY (ROUTINE TESTING)  PROTIME-INR  APTT  RAPID URINE DRUG SCREEN, HOSP PERFORMED  I-STAT BETA HCG BLOOD, ED ([facility], [facility], [facility] ONLY)  I-STAT BETA HCG BLOOD, ED ([facility], [facility], [facility] ONLY)  CBG MONITORING, ED    EKG EKG Interpretation  Date/Time:  Saturday November 29 2017 22:15:30 EDT Ventricular Rate:  82 PR Interval:    QRS Duration: 156 QT Interval:  386 QTC Calculation: 451 R  Axis:   91 Text Interpretation:  Sinus rhythm Paired ventricular premature complexes Prolonged PR interval RBBB and LPFB No significant change since last tracing Confirmed by Lajean Saver [phone]) on 11/29/2017 10:21:20 PM    EKG Interpretation  Date/Time:  Sunday November 30 2017 02:45:38 EDT Ventricular Rate:  95 PR Interval:    QRS Duration: 148 QT Interval:  367 QTC Calculation: 462 R Axis:   107 Text Interpretation:  Sinus rhythm Prolonged PR interval RBBB and LPFB Abnormal T, consider ischemia, lateral leads Nonspecific ST and T wave abnormality unchanged from prior ekg Confirmed by Varney Biles ([id number]) on 11/30/2017 3:40:51 AM        Radiology Dg Chest 2 View  Result Date: 11/29/2017 CLINICAL DATA:  Chest pain. EXAM: CHEST - 2 VIEW COMPARISON:  Radiographs 10/30/2015 FINDINGS: Post median sternotomy. Cardiomegaly with right aortic arch, unchanged from prior exam. No pulmonary edema, focal airspace disease, pleural effusion or pneumothorax. No acute osseous abnormalities. IMPRESSION: Chronic cardiomegaly and right-sided aortic arch. No acute findings. Electronically Signed   By: Jeb Levering M.D.   On: 11/29/2017 23:41    Procedures Procedures (including critical care time)  CRITICAL CARE Performed by: Taitum Menton   Total critical care time: 49 minutes  Critical care time was exclusive of separately billable procedures and treating other patients.  Critical care was necessary to treat or prevent imminent or life-threatening deterioration.  Critical care was time spent personally by me on the following activities: development of treatment plan with patient and/or surrogate as well as nursing, discussions with consultants, evaluation of patient's response to treatment, examination of patient, obtaining history from patient or surrogate, ordering and performing treatments and interventions, ordering and review of laboratory studies, ordering and review of radiographic  studies, pulse oximetry and re-evaluation of patient's condition.   Medications Ordered in ED Medications  heparin ADULT infusion 100 units/mL (25000 units/244mL sodium chloride 0.45%) (700 Units/hr Intravenous New Bag/Given 11/30/17 0242)  nitroGLYCERIN (NITROGLYN) 2 % ointment 1 inch (1 inch Topical Given 11/30/17 0255)  busPIRone (BUSPAR) tablet 10 mg (has no administration in time range)  Iron TABS 325 mg (has  no administration in time range)  metoprolol tartrate (LOPRESSOR) tablet 25 mg (has no administration in time range)  nitroGLYCERIN (NITROSTAT) SL tablet 0.4 mg (has no administration in time range)  omega-3 acid ethyl esters (LOVAZA) capsule 1 g (has no administration in time range)  pantoprazole (PROTONIX) EC tablet 40 mg (has no administration in time range)  traZODone (DESYREL) tablet 200 mg (has no administration in time range)  darifenacin (ENABLEX) 24 hr tablet 7.5 mg (has no administration in time range)  cefTRIAXone (ROCEPHIN) 1 g in sodium chloride 0.9 % 100 mL IVPB (has no administration in time range)  sodium chloride (OCEAN) 0.65 % nasal spray 1 spray (has no administration in time range)  atorvastatin (LIPITOR) tablet 80 mg (has no administration in time range)  morphine 4 MG/ML injection 2 mg (has no administration in time range)  aspirin tablet 325 mg (has no administration in time range)  insulin aspart (novoLOG) injection 0-9 Units (has no administration in time range)  insulin aspart (novoLOG) injection 0-5 Units (has no administration in time range)  acetaminophen (TYLENOL) tablet 650 mg (has no administration in time range)  ondansetron (ZOFRAN) injection 4 mg (has no administration in time range)  ALPRAZolam (XANAX) tablet 0.25 mg (has no administration in time range)  zolpidem (AMBIEN) tablet 5 mg (has no administration in time range)  hydrALAZINE (APRESOLINE) injection 5 mg (has no administration in time range)  heparin bolus via infusion 3,000 Units (3,000  Units Intravenous Bolus from Bag 11/30/17 0243)     Initial Impression / Assessment and Plan / ED Course  I have reviewed the triage vital signs and the nursing notes.  Pertinent labs & imaging results that were available during my care of the patient were reviewed by me and considered in my medical decision making (see chart for details).  Clinical Course as of Nov 30 340  Sun Nov 30, 2017  0013 Troponin assay ordered.  Troponin i, poc(!!): 0.09 [AN]  0144 Patient is chest pain-free, however her conventional troponin is higher at 0.81.  I spoke with cardiology, who is recommending medicine to admit at this time and they will see the patient as soon as possible.  Dr. Blaine Hamper to admit  Troponin I(!!): 0.81 [AN]  [phone] Cardiology service has seen the patient.  It seems like she is having intermittent chest pain, therefore they are recommending Nitropatch with heparin.  Heparin had already been ordered.  Repeat EKG has been ordered.   [AN]  0245 Nonspecific.  Patient does not have any UTI-like symptoms.  Will not start any antibiotics at this time.  WBC, UA(!): >50 [AN]  0342 Repeat EKG is unchanged.  Patient is still having some chest discomfort.  Delta Lance Bosch will be completed soon.   [AN]    Clinical Course User Index [AN] Varney Biles, MD   57 year old female with history of VSD, diabetes, hypertension and obesity comes in with chief complaint of nasal bleed.  Nosebleeds have been intermittent over the past 4 days, and it seems like they are well controlled with pressure.  On eye exam there is no specific focus of bleeding that we can appreciate.  Patient is not on any blood thinners or antiplatelet agents, there is no history of cancer or URI-like symptoms recently.  Unsure as to what the underlying process is.  CBC has been ordered to ensure platelet count is normal.  Patient denies any history of heavy bleeding or bloody gums right now which is reassuring.  Additionally, patient  also had  an episode of chest tightness on her way to the ED.  She has history of anxiety, but reports that her anxiety typically does not cause her to have chest tightness.  Interestingly, patient's PCP had given her nitroglycerin for chest discomfort, despite patient not having any known CAD.  Patient was given nitro in route and her pain is improved.  EKG has no acute findings.  Final Clinical Impressions(s) / ED Diagnoses   Final diagnoses:  NSTEMI (non-ST elevated myocardial infarction) [facility])    ED Discharge Orders    None       Varney Biles, MD 11/30/17 [place], Vondra Aldredge, MD 11/30/17 [phone]

## 2017-11-30 NOTE — ED Notes (Signed)
ED Provider at bedside. 

## 2017-11-30 NOTE — ED Notes (Signed)
Dr.NIU at bedside-Monique,RN  

## 2017-11-30 NOTE — Progress Notes (Addendum)
Briefly seen at the bedside. No further chest pain or epistaxis noted. Troponin trended up to 1.58. Story is interesting - History of congenital heart disease - she had VSD repair at [facility] when she was 87 months old, but apparently the repair did not hold - does have a typical VSD murmur. 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 Not followed locally by cardiology. Repeat echo is pending. Will plan for [facility] tomorrow to assess coronaries and evaluate degree of PS by pullback. Discussed risks, benefits and alternatives of cardiac cath with her and she is willing to proceed. Please keep NPO p MN - see signed/held orders.   Pixie Casino, MD, [facility], [place] Director of the Advanced Lipid Disorders &  Cardiovascular Risk Reduction Clinic Diplomate of the American Board of Clinical Lipidology Attending Cardiologist  Direct Dial: [phone]  Fax: [phone]  Website:  [email or web address]

## 2017-11-30 NOTE — Progress Notes (Signed)
  Echocardiogram 2D Echocardiogram has been performed.  Jennette Dubin 11/30/2017, 3:53 PM

## 2017-11-30 NOTE — ED Notes (Signed)
EDP made aware of elevated trop level-Monique,RN

## 2017-11-30 NOTE — ED Notes (Signed)
Pt cannot go to [facility] d/t ACTIVE CP, Charge RN made aware

## 2017-11-30 NOTE — H&P (Addendum)
History and Physical    Stephanie Bray WSF:[id number] DOB: 1961/05/03 DOA: 11/29/2017  Referring MD/NP/PA:   PCP: No primary care provider on file.   Patient coming from:  The patient is coming from home.  At baseline, pt is independent for most of ADL.  Chief Complaint: Chest pain, epistaxis, urinary frequency  HPI: Stephanie Bray is a 57 y.o. female with medical history significant of hypertension, hyperlipidemia, diabetes, TIA, GERD, depression with anxiety, iron deficiency anemia, migraine headaches, small VSD, who presents with chest pain, epistaxis.  Patient states that she has been having intermittent chest pain for about 3 days.  The chest pain is located in the left side of chest, intermittent, 2 or 3 times each day, lasting for about 30 minutes each time, resolves spontaneously.  No shortness of breath.  No recent long distance traveling.  No tenderness in the calf areas.  Patient does not have cough, fever or chills.  Patient states that she has been having 4 days of epistaxis in the left nasal nare.  She has nose bleeding again today, that is why she comes to the emergency room for treatment.  Nosebleeding has resolved continuously in ED. Patient does not have lightheadedness or dizziness.  Denies nausea, vomiting, diarrhea, abdominal pain.  She states that she has increased urinary frequency recently, but no dysuria or burning on urination.  Patient does not have bleeding tendency on small skin cut or brushing teeth.  ED Course: pt was found to have troponin 0.81, WBC 9.7, electrolytes renal function okay, positive urinalysis (moderate amount of leukocyte, WBC>50, hazy appearance, but no bacteria), temperature normal, no tachycardia, oxygen saturation 98% on room air, chest x-ray showed cardiomegaly without pulmonary edema or infiltration.  Patient is admitted to telemetry bed as inpatient.  Cardiology, Dr. Raiford Simmonds was consulted.  Review of Systems:   General: no fevers, chills, no body  weight gain, fatigue.  Has nosebleeding HEENT: no blurry vision, hearing changes or sore throat Respiratory: no dyspnea, coughing, wheezing CV: Has chest pain, no palpitations GI: no nausea, vomiting, abdominal pain, diarrhea, constipation GU: no dysuria, burning on urination, increased urinary frequency, hematuria  Ext: Has trace leg edema Neuro: no unilateral weakness, numbness, or tingling, no vision change or hearing loss Skin: no rash, no skin tear. MSK: No muscle spasm, no deformity, no limitation of range of movement in spin Heme: No easy bruising.  Travel history: No recent long distant travel.  Allergy:  Allergies  Allergen Reactions  . Other Other (See Comments)    Reaction unknown, per son  . Ace Inhibitors     Other reaction(s): Cough    Past Medical History:  Diagnosis Date  . Anxiety   . Arthritis    "left hip" (04/25/2014)  . Chest pain    a. 04/2014 MV: small anteroapical perfusion defect - likely breast attenuation->low risk.  . Chronic bronchitis ([place])    "get it mostly q yr" (04/25/2014)  . Depression   . Essential hypertension   . GERD (gastroesophageal reflux disease)   . Heart murmur   . High cholesterol   . Iron deficiency anemia   . Migraine    "weekly, at least" (04/25/2014)  . VSD (ventricular septal defect)    a. 04/2014 Echo: EF 55-60%, PASP 86mmHg, small restrictive either supracristal or perimembranous VSD w/o PAH.    Past Surgical History:  Procedure Laterality Date  . CHOLECYSTECTOMY    . VSD REPAIR  ~ 1963   [facility]/notes 04/25/2014  Social History:  reports that she has never smoked. She has never used smokeless tobacco. She reports that she does not drink alcohol or use drugs.  Family History:  Family History  Problem Relation Age of Onset  . COPD Mother   . Hypertension Father   . Hyperlipidemia Father      Prior to Admission medications   Medication Sig Start Date End Date Taking? Authorizing Provider  busPIRone  (BUSPAR) 10 MG tablet Take 10 mg by mouth 3 (three) times daily.   Yes [provider]  Ferrous Sulfate (IRON) 325 (65 Fe) MG TABS Take 325 mg by mouth every other day.    Yes [provider]  metFORMIN (GLUCOPHAGE-XR) 500 MG 24 hr tablet Take 500 mg by mouth daily with breakfast.  10/26/15  Yes [provider]  metoprolol tartrate (LOPRESSOR) 25 MG tablet Take 25 mg by mouth daily.  10/11/15  Yes [provider]  nitroGLYCERIN (NITROSTAT) 0.4 MG SL tablet Place 1 tablet (0.4 mg total) under the tongue every 5 (five) minutes as needed for chest pain. 04/26/14  Yes Rumley, [place] N, DO  Omega-3 Fatty Acids (FISH OIL) 1000 MG CAPS Take 1,000 mg by mouth daily.   Yes [provider]  omeprazole (PRILOSEC) 20 MG capsule Take 20 mg by mouth daily.    Yes [provider]  simvastatin (ZOCOR) 10 MG tablet Take 10 mg by mouth daily.   Yes [provider]  traZODone (DESYREL) 100 MG tablet Take 200 mg by mouth at bedtime as needed for sleep.    Yes [provider]  VESICARE 5 MG tablet Take 5 mg by mouth. 10/11/15  Yes [provider]  benzonatate (TESSALON) 100 MG capsule Take 1 capsule (100 mg total) by mouth every 8 (eight) hours. Patient not taking: Reported on 11/30/2017 10/30/15   Karma Greaser, MD  fluticasone [facility]) 50 MCG/ACT nasal spray Place 2 sprays into both nostrils daily. Patient not taking: Reported on 11/30/2017 10/30/15   Karma Greaser, MD    Physical Exam: Vitals:   11/30/17 0130 11/30/17 0200 11/30/17 0204 11/30/17 0230  BP: 138/78 126/62 126/62 129/77  Pulse: 92 85 86 100  Resp: (!) 24 (!) 22 19 (!) 21  Temp:      TempSrc:      SpO2: 98% 98% 98% 97%  Weight:      Height:       General: Not in acute distress HEENT:       Eyes: PERRL, EOMI, no scleral icterus.       ENT: No discharge from the ears and nose, no pharynx injection, no tonsillar enlargement.        Neck: No JVD, no bruit,  no mass felt. Heme: No neck lymph node enlargement. Cardiac: O7/F6, RRR, 4/6 systolic murmurs, No gallops or rubs. Respiratory:  No rales, wheezing, rhonchi or rubs. GI: Soft, nondistended, nontender, no rebound pain, no organomegaly, BS present. GU: No hematuria Ext: No pitting leg edema bilaterally. 2+DP/PT pulse bilaterally. Musculoskeletal: No joint deformities, No joint redness or warmth, no limitation of ROM in spin. Skin: No rashes.  Neuro: Alert, oriented X3, cranial nerves II-XII grossly intact, moves all extremities normally. Psych: Patient is not psychotic, no suicidal or hemocidal ideation.  Labs on Admission: I have personally reviewed following labs and imaging studies  CBC: Recent Labs  Lab 11/29/17 2244  WBC 9.7  HGB 11.8*  HCT 39.3  MCV 77.1*  PLT 256   Basic  Metabolic Panel: Recent Labs  Lab 11/29/17 2244  NA 139  K 3.6  CL 104  CO2 25  GLUCOSE 130*  BUN 14  CREATININE 0.56  CALCIUM 9.4   GFR: Estimated Creatinine Clearance: 66.8 mL/min (by C-G formula based on SCr of 0.56 mg/dL). Liver Function Tests: No results for input(s): AST, ALT, ALKPHOS, BILITOT, PROT, ALBUMIN in the last 168 hours. No results for input(s): LIPASE, AMYLASE in the last 168 hours. No results for input(s): AMMONIA in the last 168 hours. Coagulation Profile: No results for input(s): INR, PROTIME in the last 168 hours. Cardiac Enzymes: Recent Labs  Lab 11/30/17 0025  TROPONINI 0.81*   BNP (last 3 results) No results for input(s): PROBNP in the last 8760 hours. HbA1C: No results for input(s): HGBA1C in the last 72 hours. CBG: No results for input(s): GLUCAP in the last 168 hours. Lipid Profile: No results for input(s): CHOL, HDL, LDLCALC, TRIG, CHOLHDL, LDLDIRECT in the last 72 hours. Thyroid Function Tests: No results for input(s): TSH, T4TOTAL, FREET4, T3FREE, THYROIDAB in the last 72 hours. Anemia Panel: No results for input(s): VITAMINB12, FOLATE, FERRITIN, TIBC,  IRON, RETICCTPCT in the last 72 hours. Urine analysis:    Component Value Date/Time   COLORURINE YELLOW 11/29/2017 2339   APPEARANCEUR HAZY (A) 11/29/2017 2339   LABSPEC 1.027 11/29/2017 2339   PHURINE 5.0 11/29/2017 2339   GLUCOSEU NEGATIVE 11/29/2017 2339   HGBUR NEGATIVE 11/29/2017 2339   BILIRUBINUR NEGATIVE 11/29/2017 2339   KETONESUR 20 (A) 11/29/2017 2339   PROTEINUR NEGATIVE 11/29/2017 2339   UROBILINOGEN 0.2 09/26/2014 1458   NITRITE NEGATIVE 11/29/2017 2339   LEUKOCYTESUR MODERATE (A) 11/29/2017 2339   Sepsis Labs: @LABRCNTIP (procalcitonin:4,lacticidven:4) )No results found for this or any previous visit (from the past 240 hour(s)).   Radiological Exams on Admission: Dg Chest 2 View  Result Date: 11/29/2017 CLINICAL DATA:  Chest pain. EXAM: CHEST - 2 VIEW COMPARISON:  Radiographs 10/30/2015 FINDINGS: Post median sternotomy. Cardiomegaly with right aortic arch, unchanged from prior exam. No pulmonary edema, focal airspace disease, pleural effusion or pneumothorax. No acute osseous abnormalities. IMPRESSION: Chronic cardiomegaly and right-sided aortic arch. No acute findings. Electronically Signed   By: Jeb Levering M.D.   On: 11/29/2017 23:41     EKG: Independently reviewed.  Sinus rhythm, QTC 496, frequent PVC, right bundle blockade, RAD, no significant change from previous EKG.    Assessment/Plan Principal Problem:   NSTEMI (non-ST elevated myocardial infarction) ([place]) Active Problems:   Depression   Diabetes mellitus without complication (HCC)   HLD (hyperlipidemia)   Iron deficiency anemia   GERD (gastroesophageal reflux disease)   Essential hypertension   Epistaxis   NSTEMI (non-ST elevated myocardial infarction) ([place]): trop 0.81. Dr. Raiford Simmonds was consulted-->recommended IV heparin  - will admit to tele bed as inpt-->changed to SDU due to persistent CP-->will start Nitro gtt. - IV heparin-->closely monitoring nose bleeding - cycle CE q6 x3 and repeat  EKG in the am  - prn Nitroglycerin, Morphine, and aspirin, lipitor metoprolol - Risk factor stratification: will check FLP, UDS and A1C  - 2d echo  Epistaxis: nose bleeding stopped. Likely due to dryness. hgb 11.8. -saline nasal spry for keeping moisturer  -check INR/PTT  Diabetes mellitus without complication: Last F6E 6.1 on 07/19/15, well controled. Patient is taking metformin at home -SSI  Depression and anxiety: Stable, no suicidal or homicidal ideations. -Continue home medications: BuSpar -prn xanax  HLD (hyperlipidemia): -change home zocor to lipitor 80 mg daily due to NSTEMi  Iron deficiency anemia: Hemoglobin stable, 11.8 -continue iron supplement  GERD: -Protonix  Essential hypertension: -continue metoprolol -IV hydralazine as needed  UTI: -IV rocephine -f/u Ux and bx   DVT ppx: on IV Heparin  Code Status: Full code Family Communication: None at bed side.     Disposition Plan:  Anticipate discharge back to previous home environment Consults called:  Dr. Raiford Simmonds of Card Admission status:  Inpatient/tele     Date of Service 11/30/2017    Ivor Costa [facility] Pager [phone]  If 7PM-7AM, please contact night-coverage [email or web address] Password [facility] 11/30/2017, 3:45 AM

## 2017-11-30 NOTE — Consult Note (Signed)
History & Physical    Patient ID: Stephanie Bray MRN: [id number], DOB/AGE: 57-Nov-1962   Admit date: 11/29/2017   Primary Physician: No primary care provider on file. Primary Cardiologist: No primary care provider on file.  Patient Profile    Chest pain  Past Medical History    Past Medical History:  Diagnosis Date  . Anxiety   . Arthritis    "left hip" (04/25/2014)  . Chest pain    a. 04/2014 MV: small anteroapical perfusion defect - likely breast attenuation->low risk.  . Chronic bronchitis ([place])    "get it mostly q yr" (04/25/2014)  . Depression   . Essential hypertension   . GERD (gastroesophageal reflux disease)   . Heart murmur   . High cholesterol   . Iron deficiency anemia   . Migraine    "weekly, at least" (04/25/2014)  . VSD (ventricular septal defect)    a. 04/2014 Echo: EF 55-60%, PASP 58mmHg, small restrictive either supracristal or perimembranous VSD w/o PAH.    Past Surgical History:  Procedure Laterality Date  . CHOLECYSTECTOMY    . VSD REPAIR  ~ 1963   [facility]/notes 04/25/2014     Allergies  Allergies  Allergen Reactions  . Other Other (See Comments)    Reaction unknown, per son  . Ace Inhibitors     Other reaction(s): Cough    History of Present Illness    57 year old woman with a past medical history of a perimembranous VSD [unrepaired], diabetes, hypertension, obesity.  She presents with a chief complaint of bloody nose and chest tightness.  Symptoms started today.  Pain is nonradiating and retrosternal.  Pain occurred at rest.  Not worsened with exertion.  Nosebleeds have been ongoing for the last few days.  He denies fever, night sweats, weight loss or weight gain.  History of CAD.  Never had a left heart catheterization.  The stress test few years ago was negative.  TTE done 2 years ago showed preserved LV function.  She does seem to have elevated RV systolic pressures and a 20 mmHg gradient across pulmonary valve.  Home Medications      Prior to Admission medications   Medication Sig Start Date End Date Taking? Authorizing Provider  busPIRone (BUSPAR) 10 MG tablet Take 10 mg by mouth 3 (three) times daily.   Yes [provider]  Ferrous Sulfate (IRON) 325 (65 Fe) MG TABS Take 325 mg by mouth every other day.    Yes [provider]  metFORMIN (GLUCOPHAGE-XR) 500 MG 24 hr tablet Take 500 mg by mouth daily with breakfast.  10/26/15  Yes [provider]  metoprolol tartrate (LOPRESSOR) 25 MG tablet Take 25 mg by mouth daily.  10/11/15  Yes [provider]  nitroGLYCERIN (NITROSTAT) 0.4 MG SL tablet Place 1 tablet (0.4 mg total) under the tongue every 5 (five) minutes as needed for chest pain. 04/26/14  Yes Rumley, [place] N, DO  Omega-3 Fatty Acids (FISH OIL) 1000 MG CAPS Take 1,000 mg by mouth daily.   Yes [provider]  omeprazole (PRILOSEC) 20 MG capsule Take 20 mg by mouth daily.    Yes [provider]  simvastatin (ZOCOR) 10 MG tablet Take 10 mg by mouth daily.   Yes [provider]  traZODone (DESYREL) 100 MG tablet Take 200 mg by mouth at bedtime as needed for sleep.    Yes [provider]  VESICARE 5 MG tablet Take 5 mg by mouth. 10/11/15  Yes [provider]  benzonatate (TESSALON) 100 MG capsule Take 1 capsule (100 mg total) by mouth every 8 (eight) hours. Patient not taking: Reported on 11/30/2017 10/30/15   Karma Greaser, MD  fluticasone [facility]) 50 MCG/ACT nasal spray Place 2 sprays into both nostrils daily. Patient not taking: Reported on 11/30/2017 10/30/15   Karma Greaser, MD    Family History    Family History  Problem Relation Age of Onset  . COPD Mother   . Hypertension Father   . Hyperlipidemia Father    indicated that her mother is deceased. She indicated that her father is deceased. She indicated that her brother is alive.   Social History    Social History   Socioeconomic History  . Marital status: Divorced     Spouse name: Not on file  . Number of children: Not on file  . Years of education: Not on file  . Highest education level: Not on file  Occupational History  . Not on file  Social Needs  . Financial resource strain: Not on file  . Food insecurity:    Worry: Not on file    Inability: Not on file  . Transportation needs:    Medical: Not on file    Non-medical: Not on file  Tobacco Use  . Smoking status: Never Smoker  . Smokeless tobacco: Never Used  Substance and Sexual Activity  . Alcohol use: No  . Drug use: No  . Sexual activity: Never  Lifestyle  . Physical activity:    Days per week: Not on file    Minutes per session: Not on file  . Stress: Not on file  Relationships  . Social connections:    Talks on phone: Not on file    Gets together: Not on file    Attends religious service: Not on file    Active member of club or organization: Not on file    Attends meetings of clubs or organizations: Not on file    Relationship status: Not on file  . Intimate partner violence:    Fear of current or ex partner: Not on file    Emotionally abused: Not on file    Physically abused: Not on file    Forced sexual activity: Not on file  Other Topics Concern  . Not on file  Social History Narrative   Lives in [place] with her three sons.    Hobbies: reads and draw    On disability due to anxiety      Review of Systems    General:  No chills, fever, night sweats or weight changes.  Cardiovascular:  No chest pain, dyspnea on exertion, edema, orthopnea, palpitations, paroxysmal nocturnal dyspnea. Dermatological: No rash, lesions/masses Respiratory: No cough, dyspnea Urologic: No hematuria, dysuria Abdominal:   No nausea, vomiting, diarrhea, bright red blood per rectum, melena, or hematemesis Neurologic:  No visual changes, wkns, changes in mental status. All other systems reviewed and are otherwise negative except as noted above.  Physical Exam    Blood pressure  126/62, pulse 86, temperature (S) 98.8 F (37.1 C), temperature source Oral, resp. rate 19, height 4\' 10"  (1.473 m), weight 73.5 kg (162 lb), SpO2 98 %.  General: Pleasant, NAD Psych: Normal affect. Neuro: Alert and oriented X 3. Moves all extremities spontaneously. HEENT: Normal  Neck: Supple without bruits or JVD. Lungs:  Resp regular and unlabored, CTA. Heart: RRR no s3, s4, or murmurs. Abdomen: Soft, non-tender, non-distended, BS + x 4.  Extremities: No clubbing,  cyanosis or edema. DP/PT/Radials 2+ and equal bilaterally.  Labs    Troponin [facility] of Care Test) Recent Labs    11/29/17 2252  TROPIPOC 0.09*   Recent Labs    11/30/17 0025  TROPONINI 0.81*   Lab Results  Component Value Date   WBC 9.7 11/29/2017   HGB 11.8 (L) 11/29/2017   HCT 39.3 11/29/2017   MCV 77.1 (L) 11/29/2017   PLT 256 11/29/2017    Recent Labs  Lab 11/29/17 2244  NA 139  K 3.6  CL 104  CO2 25  BUN 14  CREATININE 0.56  CALCIUM 9.4  GLUCOSE 130*   Lab Results  Component Value Date   CHOL 226 (H) 07/19/2015   HDL 46 07/19/2015   LDLCALC 170 (H) 07/19/2015   TRIG 50 07/19/2015   Lab Results  Component Value Date   DDIMER 0.36 04/25/2014     Radiology Studies    Dg Chest 2 View  Result Date: 11/29/2017 CLINICAL DATA:  Chest pain. EXAM: CHEST - 2 VIEW COMPARISON:  Radiographs 10/30/2015 FINDINGS: Post median sternotomy. Cardiomegaly with right aortic arch, unchanged from prior exam. No pulmonary edema, focal airspace disease, pleural effusion or pneumothorax. No acute osseous abnormalities. IMPRESSION: Chronic cardiomegaly and right-sided aortic arch. No acute findings. Electronically Signed   By: Jeb Levering M.D.   On: 11/29/2017 23:41    ECG & Cardiac Imaging    Normal sinus rhythm, right bundle branch block, first-degree heart block, no acute ischemic changes  Assessment & Plan    NSTEMI: Positive troponin, no ECG changes patient is now asymptomatic.  No known CAD.   Preserved LV ejection fraction.  Other remarkable cardiac comorbidities include a perimembranous VSD.  Recommendations: -Heparin drip.  Although she had epistaxis she is currently not having any of it.  Plus if she to be considered for left heart catheterization with stent placement this could serve as a sort of stress test to make sure she would be tolerating antiplatelet and anticoagulation during the procedure and beyond. -Aspirin load. -Nitroglycerin if again symptomatic. -Hold metformin -Restart on metoprolol.  Transition to atorvastatin 80 if possible.  Signed, Cristina Gong, MD 11/30/2017, 2:15 AM

## 2017-11-30 NOTE — ED Notes (Signed)
Cardiology at [facility]

## 2017-11-30 NOTE — Progress Notes (Addendum)
ANTICOAGULATION CONSULT NOTE - Follow Up Consult  Pharmacy Consult for heparin Indication: chest pain/ACS  Allergies  Allergen Reactions  . Other Other (See Comments)    Reaction unknown, per son  . Ace Inhibitors     Other reaction(s): Cough    Patient Measurements: Height: 4\' 10"  (147.3 cm) Weight: 163 lb 12.8 oz (74.3 kg) IBW/kg (Calculated) : 40.9 Heparin Dosing Weight: 57.8 kg  Vital Signs: Temp: 99 F (37.2 C) (07/28 1100) Temp Source: Oral (07/28 1100) BP: 123/70 (07/28 1100) Pulse Rate: 85 (07/28 1100)  Labs: Recent Labs    11/29/17 2244  11/30/17 0331 11/30/17 0857 11/30/17 1611 11/30/17 2104  HGB 11.8*  --  11.8*  --   --   --   HCT 39.3  --  40.0  --   --   --   PLT 256  --  263  --   --   --   APTT  --   --  120*  --   --   --   LABPROT  --   --  14.0  --   --   --   INR  --   --  1.09  --   --   --   HEPARINUNFRC  --   --   --  0.17*  --  0.14*  CREATININE 0.56  --   --   --   --   --   TROPONINI  --    < > 1.58* 1.03* 0.49*  --    < > = values in this interval not displayed.    Estimated Creatinine Clearance: 67.3 mL/min (by C-G formula based on SCr of 0.56 mg/dL).   Medical History: Past Medical History:  Diagnosis Date  . Anxiety   . Arthritis    "left hip" (04/25/2014)  . Chest pain    a. 04/2014 MV: small anteroapical perfusion defect - likely breast attenuation->low risk.  . Chronic bronchitis ([place])    "get it mostly q yr" (04/25/2014)  . Depression   . Essential hypertension   . GERD (gastroesophageal reflux disease)   . Heart murmur   . High cholesterol   . Iron deficiency anemia   . Migraine    "weekly, at least" (04/25/2014)  . VSD (ventricular septal defect)    a. 04/2014 Echo: EF 55-60%, PASP 52mmHg, small restrictive either supracristal or perimembranous VSD w/o PAH.     Assessment: 57 yo lady to start heparin for CP.  She was not on anticoagulation PTA. Heparin drip 700 uts/hr HL 0.16 < goal no bleeding, h/h stable.    Cath tomorrow  Heparin level low at 0.14, no issues with infusion per RN.  Goal of Therapy:  Heparin level 0.3-0.7 units/ml Monitor platelets by anticoagulation protocol: Yes   Plan:  Heparin to 1050 units/hr Recheck with am labs  Arrie Senate, PharmD, BCPS Clinical Pharmacist [phone] Please check AMION for all [place] numbers 11/30/2017

## 2017-11-30 NOTE — H&P (View-Only) (Signed)
History & Physical    Patient ID: Stephanie Bray MRN: [id number], DOB/AGE: February 13, 1961   Admit date: 11/29/2017   Primary Physician: No primary care provider on file. Primary Cardiologist: No primary care provider on file.  Patient Profile    Chest pain  Past Medical History    Past Medical History:  Diagnosis Date  . Anxiety   . Arthritis    "left hip" (04/25/2014)  . Chest pain    a. 04/2014 MV: small anteroapical perfusion defect - likely breast attenuation->low risk.  . Chronic bronchitis ([place])    "get it mostly q yr" (04/25/2014)  . Depression   . Essential hypertension   . GERD (gastroesophageal reflux disease)   . Heart murmur   . High cholesterol   . Iron deficiency anemia   . Migraine    "weekly, at least" (04/25/2014)  . VSD (ventricular septal defect)    a. 04/2014 Echo: EF 55-60%, PASP 53mmHg, small restrictive either supracristal or perimembranous VSD w/o PAH.    Past Surgical History:  Procedure Laterality Date  . CHOLECYSTECTOMY    . VSD REPAIR  ~ 1963   [facility]/notes 04/25/2014     Allergies  Allergies  Allergen Reactions  . Other Other (See Comments)    Reaction unknown, per son  . Ace Inhibitors     Other reaction(s): Cough    History of Present Illness    57 year old woman with a past medical history of a perimembranous VSD [unrepaired], diabetes, hypertension, obesity.  She presents with a chief complaint of bloody nose and chest tightness.  Symptoms started today.  Pain is nonradiating and retrosternal.  Pain occurred at rest.  Not worsened with exertion.  Nosebleeds have been ongoing for the last few days.  He denies fever, night sweats, weight loss or weight gain.  History of CAD.  Never had a left heart catheterization.  The stress test few years ago was negative.  TTE done 2 years ago showed preserved LV function.  She does seem to have elevated RV systolic pressures and a 20 mmHg gradient across pulmonary valve.  Home Medications      Prior to Admission medications   Medication Sig Start Date End Date Taking? Authorizing Provider  busPIRone (BUSPAR) 10 MG tablet Take 10 mg by mouth 3 (three) times daily.   Yes [provider]  Ferrous Sulfate (IRON) 325 (65 Fe) MG TABS Take 325 mg by mouth every other day.    Yes [provider]  metFORMIN (GLUCOPHAGE-XR) 500 MG 24 hr tablet Take 500 mg by mouth daily with breakfast.  10/26/15  Yes [provider]  metoprolol tartrate (LOPRESSOR) 25 MG tablet Take 25 mg by mouth daily.  10/11/15  Yes [provider]  nitroGLYCERIN (NITROSTAT) 0.4 MG SL tablet Place 1 tablet (0.4 mg total) under the tongue every 5 (five) minutes as needed for chest pain. 04/26/14  Yes Rumley, Adeline N, DO  Omega-3 Fatty Acids (FISH OIL) 1000 MG CAPS Take 1,000 mg by mouth daily.   Yes [provider]  omeprazole (PRILOSEC) 20 MG capsule Take 20 mg by mouth daily.    Yes [provider]  simvastatin (ZOCOR) 10 MG tablet Take 10 mg by mouth daily.   Yes [provider]  traZODone (DESYREL) 100 MG tablet Take 200 mg by mouth at bedtime as needed for sleep.    Yes [provider]  VESICARE 5 MG tablet Take 5 mg by mouth. 10/11/15  Yes [provider]  benzonatate (TESSALON) 100 MG capsule Take 1 capsule (100 mg total) by mouth every 8 (eight) hours. Patient not taking: Reported on 11/30/2017 10/30/15   Karma Greaser, MD  fluticasone [facility]) 50 MCG/ACT nasal spray Place 2 sprays into both nostrils daily. Patient not taking: Reported on 11/30/2017 10/30/15   Karma Greaser, MD    Family History    Family History  Problem Relation Age of Onset  . COPD Mother   . Hypertension Father   . Hyperlipidemia Father    indicated that her mother is deceased. She indicated that her father is deceased. She indicated that her brother is alive.   Social History    Social History   Socioeconomic History  . Marital status: Divorced     Spouse name: Not on file  . Number of children: Not on file  . Years of education: Not on file  . Highest education level: Not on file  Occupational History  . Not on file  Social Needs  . Financial resource strain: Not on file  . Food insecurity:    Worry: Not on file    Inability: Not on file  . Transportation needs:    Medical: Not on file    Non-medical: Not on file  Tobacco Use  . Smoking status: Never Smoker  . Smokeless tobacco: Never Used  Substance and Sexual Activity  . Alcohol use: No  . Drug use: No  . Sexual activity: Never  Lifestyle  . Physical activity:    Days per week: Not on file    Minutes per session: Not on file  . Stress: Not on file  Relationships  . Social connections:    Talks on phone: Not on file    Gets together: Not on file    Attends religious service: Not on file    Active member of club or organization: Not on file    Attends meetings of clubs or organizations: Not on file    Relationship status: Not on file  . Intimate partner violence:    Fear of current or ex partner: Not on file    Emotionally abused: Not on file    Physically abused: Not on file    Forced sexual activity: Not on file  Other Topics Concern  . Not on file  Social History Narrative   Lives in [place] with her three sons.    Hobbies: reads and draw    On disability due to anxiety      Review of Systems    General:  No chills, fever, night sweats or weight changes.  Cardiovascular:  No chest pain, dyspnea on exertion, edema, orthopnea, palpitations, paroxysmal nocturnal dyspnea. Dermatological: No rash, lesions/masses Respiratory: No cough, dyspnea Urologic: No hematuria, dysuria Abdominal:   No nausea, vomiting, diarrhea, bright red blood per rectum, melena, or hematemesis Neurologic:  No visual changes, wkns, changes in mental status. All other systems reviewed and are otherwise negative except as noted above.  Physical Exam    Blood pressure  126/62, pulse 86, temperature (S) 98.8 F (37.1 C), temperature source Oral, resp. rate 19, height 4\' 10"  (1.473 m), weight 73.5 kg (162 lb), SpO2 98 %.  General: Pleasant, NAD Psych: Normal affect. Neuro: Alert and oriented X 3. Moves all extremities spontaneously. HEENT: Normal  Neck: Supple without bruits or JVD. Lungs:  Resp regular and unlabored, CTA. Heart: RRR no s3, s4, or murmurs. Abdomen: Soft, non-tender, non-distended, BS + x 4.  Extremities: No clubbing,  cyanosis or edema. DP/PT/Radials 2+ and equal bilaterally.  Labs    Troponin [facility] Test) Recent Labs    11/29/17 2252  TROPIPOC 0.09*   Recent Labs    11/30/17 0025  TROPONINI 0.81*   Lab Results  Component Value Date   WBC 9.7 11/29/2017   HGB 11.8 (L) 11/29/2017   HCT 39.3 11/29/2017   MCV 77.1 (L) 11/29/2017   PLT 256 11/29/2017    Recent Labs  Lab 11/29/17 2244  NA 139  K 3.6  CL 104  CO2 25  BUN 14  CREATININE 0.56  CALCIUM 9.4  GLUCOSE 130*   Lab Results  Component Value Date   CHOL 226 (H) 07/19/2015   HDL 46 07/19/2015   LDLCALC 170 (H) 07/19/2015   TRIG 50 07/19/2015   Lab Results  Component Value Date   DDIMER 0.36 04/25/2014     Radiology Studies    Dg Chest 2 View  Result Date: 11/29/2017 CLINICAL DATA:  Chest pain. EXAM: CHEST - 2 VIEW COMPARISON:  Radiographs 10/30/2015 FINDINGS: Post median sternotomy. Cardiomegaly with right aortic arch, unchanged from prior exam. No pulmonary edema, focal airspace disease, pleural effusion or pneumothorax. No acute osseous abnormalities. IMPRESSION: Chronic cardiomegaly and right-sided aortic arch. No acute findings. Electronically Signed   By: Jeb Levering M.D.   On: 11/29/2017 23:41    ECG & Cardiac Imaging    Normal sinus rhythm, right bundle branch block, first-degree heart block, no acute ischemic changes  Assessment & Plan    NSTEMI: Positive troponin, no ECG changes patient is now asymptomatic.  No known CAD.   Preserved LV ejection fraction.  Other remarkable cardiac comorbidities include a perimembranous VSD.  Recommendations: -Heparin drip.  Although she had epistaxis she is currently not having any of it.  Plus if she to be considered for left heart catheterization with stent placement this could serve as a sort of stress test to make sure she would be tolerating antiplatelet and anticoagulation during the procedure and beyond. -Aspirin load. -Nitroglycerin if again symptomatic. -Hold metformin -Restart on metoprolol.  Transition to atorvastatin 80 if possible.  Signed, Cristina Gong, MD 11/30/2017, 2:15 AM

## 2017-11-30 NOTE — Plan of Care (Signed)
57 year old female with history of hypertension diabetes hyperlipidemia TIA GERD, history of congenital heart disease and VSD repair admitted with chest pain this morning by my partner.  Troponin peaked at 1.58 she was treated with IV heparin and nitroglycerin.  Cardiology consult noted.  Cath tomorrow noted.  Patient currently chest pain-free of nitroglycerin.

## 2017-12-01 ENCOUNTER — Encounter (HOSPITAL_COMMUNITY)
Admission: EM | Disposition: A | Discharge status: Home / Self Care | Payer: Self-pay | Source: Home / Self Care | Attending: Internal Medicine

## 2017-12-01 ENCOUNTER — Encounter (HOSPITAL_COMMUNITY): Payer: Self-pay | Admitting: Cardiology

## 2017-12-01 ENCOUNTER — Other Ambulatory Visit: Payer: Self-pay

## 2017-12-01 DIAGNOSIS — I420 Dilated cardiomyopathy: Secondary | ICD-10-CM

## 2017-12-01 HISTORY — PX: RIGHT/LEFT HEART CATH AND CORONARY ANGIOGRAPHY: CATH118266

## 2017-12-01 LAB — POCT I-STAT 3, VENOUS BLOOD GAS (G3P V)
ACID-BASE DEFICIT: 1 mmol/L (ref 0.0–2.0)
ACID-BASE DEFICIT: 1 mmol/L (ref 0.0–2.0)
ACID-BASE DEFICIT: 1 mmol/L (ref 0.0–2.0)
Acid-base deficit: 1 mmol/L (ref 0.0–2.0)
Acid-base deficit: 1 mmol/L (ref 0.0–2.0)
Acid-base deficit: 1 mmol/L (ref 0.0–2.0)
Acid-base deficit: 1 mmol/L (ref 0.0–2.0)
BICARBONATE: 26.1 mmol/L (ref 20.0–28.0)
BICARBONATE: 26.2 mmol/L (ref 20.0–28.0)
BICARBONATE: 26.3 mmol/L (ref 20.0–28.0)
BICARBONATE: 27.5 mmol/L (ref 20.0–28.0)
BICARBONATE: 27.7 mmol/L (ref 20.0–28.0)
Bicarbonate: 26 mmol/L (ref 20.0–28.0)
Bicarbonate: 26.3 mmol/L (ref 20.0–28.0)
Bicarbonate: 26.5 mmol/L (ref 20.0–28.0)
Bicarbonate: 26.7 mmol/L (ref 20.0–28.0)
O2 SAT: 62 %
O2 SAT: 63 %
O2 SAT: 64 %
O2 SAT: 70 %
O2 SAT: 76 %
O2 Saturation: 66 %
O2 Saturation: 74 %
O2 Saturation: 75 %
O2 Saturation: 78 %
PCO2 VEN: 55.5 mmHg (ref 44.0–60.0)
PCO2 VEN: 55.7 mmHg (ref 44.0–60.0)
PCO2 VEN: 55.7 mmHg (ref 44.0–60.0)
PCO2 VEN: 55.8 mmHg (ref 44.0–60.0)
PCO2 VEN: 57.1 mmHg (ref 44.0–60.0)
PCO2 VEN: 58.4 mmHg (ref 44.0–60.0)
PCO2 VEN: 59.6 mmHg (ref 44.0–60.0)
PH VEN: 7.259 (ref 7.250–7.430)
PH VEN: 7.281 (ref 7.250–7.430)
PH VEN: 7.284 (ref 7.250–7.430)
PH VEN: 7.303 (ref 7.250–7.430)
PO2 VEN: 38 mmHg (ref 32.0–45.0)
PO2 VEN: 39 mmHg (ref 32.0–45.0)
PO2 VEN: 42 mmHg (ref 32.0–45.0)
TCO2: 28 mmol/L (ref 22–32)
TCO2: 28 mmol/L (ref 22–32)
TCO2: 28 mmol/L (ref 22–32)
TCO2: 29 mmol/L (ref 22–32)
TCO2: 29 mmol/L (ref 22–32)
TCO2: 28 mmol/L (ref 22–32)
TCO2: 28 mmol/L (ref 22–32)
TCO2: 28 mmol/L (ref 22–32)
TCO2: 28 mmol/L (ref 22–32)
pCO2, Ven: 55.5 mmHg (ref 44.0–60.0)
pCO2, Ven: 56.4 mmHg (ref 44.0–60.0)
pH, Ven: 7.269 (ref 7.250–7.430)
pH, Ven: 7.277 (ref 7.250–7.430)
pH, Ven: 7.28 (ref 7.250–7.430)
pH, Ven: 7.281 (ref 7.250–7.430)
pH, Ven: 7.282 (ref 7.250–7.430)
pO2, Ven: 37 mmHg (ref 32.0–45.0)
pO2, Ven: 40 mmHg (ref 32.0–45.0)
pO2, Ven: 45 mmHg (ref 32.0–45.0)
pO2, Ven: 46 mmHg — ABNORMAL HIGH (ref 32.0–45.0)
pO2, Ven: 46 mmHg — ABNORMAL HIGH (ref 32.0–45.0)
pO2, Ven: 48 mmHg — ABNORMAL HIGH (ref 32.0–45.0)

## 2017-12-01 LAB — CBC
HEMATOCRIT: 38 % (ref 36.0–46.0)
HEMOGLOBIN: 11.1 g/dL — AB (ref 12.0–15.0)
MCH: 22.7 pg — ABNORMAL LOW (ref 26.0–34.0)
MCHC: 29.2 g/dL — AB (ref 30.0–36.0)
MCV: 77.7 fL — ABNORMAL LOW (ref 78.0–100.0)
Platelets: 245 10*3/uL (ref 150–400)
RBC: 4.89 MIL/uL (ref 3.87–5.11)
RDW: 16.1 % — AB (ref 11.5–15.5)
WBC: 11.8 10*3/uL — AB (ref 4.0–10.5)

## 2017-12-01 LAB — GLUCOSE, CAPILLARY
GLUCOSE-CAPILLARY: 126 mg/dL — AB (ref 70–99)
Glucose-Capillary: 136 mg/dL — ABNORMAL HIGH (ref 70–99)
Glucose-Capillary: 135 mg/dL — ABNORMAL HIGH (ref 70–99)
Glucose-Capillary: 138 mg/dL — ABNORMAL HIGH (ref 70–99)
Glucose-Capillary: 136 mg/dL — ABNORMAL HIGH (ref 70–99)

## 2017-12-01 LAB — URINE CULTURE: Culture: NO GROWTH

## 2017-12-01 LAB — POCT I-STAT 3, ART BLOOD GAS (G3+)
Acid-base deficit: 2 mmol/L (ref 0.0–2.0)
Bicarbonate: 25.7 mmol/L (ref 20.0–28.0)
O2 Saturation: 99 %
TCO2: 27 mmol/L (ref 22–32)
pCO2 arterial: 54.9 mmHg — ABNORMAL HIGH (ref 32.0–48.0)
pH, Arterial: 7.279 — ABNORMAL LOW (ref 7.350–7.450)
pO2, Arterial: 139 mmHg — ABNORMAL HIGH (ref 83.0–108.0)

## 2017-12-01 LAB — HEPARIN LEVEL (UNFRACTIONATED): HEPARIN UNFRACTIONATED: 0.33 [IU]/mL (ref 0.30–0.70)

## 2017-12-01 LAB — POCT ACTIVATED CLOTTING TIME: ACTIVATED CLOTTING TIME: 131 s

## 2017-12-01 LAB — CREATININE, SERUM
Creatinine, Ser: 0.57 mg/dL (ref 0.44–1.00)
GFR calc Af Amer: >60 mL/min (ref >60)
GFR calc non Af Amer: >60 mL/min (ref >60)

## 2017-12-01 SURGERY — RIGHT/LEFT HEART CATH AND CORONARY ANGIOGRAPHY
Anesthesia: LOCAL

## 2017-12-01 MED ORDER — HEPARIN (PORCINE) IN NACL 1000-0.9 UT/500ML-% IV SOLN
INTRAVENOUS | Status: DC | PRN
  Administered 2017-12-01: 500 mL

## 2017-12-01 MED ORDER — FENTANYL CITRATE (PF) 100 MCG/2ML IJ SOLN
INTRAMUSCULAR | Status: AC
Start: 2017-12-01 — End: ?
  Filled 2017-12-01: qty 2

## 2017-12-01 MED ORDER — FUROSEMIDE 10 MG/ML IJ SOLN
40.0000 mg | Freq: Once | INTRAMUSCULAR | Status: AC
  Administered 2017-12-01: 40 mg via INTRAVENOUS
  Filled 2017-12-01: qty 4

## 2017-12-01 MED ORDER — HEPARIN (PORCINE) IN NACL 1000-0.9 UT/500ML-% IV SOLN
INTRAVENOUS | Status: AC
  Filled 2017-12-01: qty 1000

## 2017-12-01 MED ORDER — SODIUM CHLORIDE 0.9 % IV SOLN: 250.0000 mL | INTRAVENOUS | Status: DC | PRN

## 2017-12-01 MED ORDER — SODIUM CHLORIDE 0.9% FLUSH
3.0000 mL | Freq: Two times a day (BID) | INTRAVENOUS | Status: DC
  Administered 2017-12-01: 3 mL via INTRAVENOUS

## 2017-12-01 MED ORDER — MIDAZOLAM HCL 2 MG/2ML IJ SOLN
INTRAMUSCULAR | Status: DC | PRN
  Administered 2017-12-01: 1 mg via INTRAVENOUS

## 2017-12-01 MED ORDER — GUAIFENESIN-DM 100-10 MG/5ML PO SYRP: 5.0000 mL | ORAL_SOLUTION | ORAL | Status: DC | PRN

## 2017-12-01 MED ORDER — HEPARIN SODIUM (PORCINE) 5000 UNIT/ML IJ SOLN
5000.0000 [IU] | Freq: Three times a day (TID) | INTRAMUSCULAR | Status: DC
  Administered 2017-12-01 – 2017-12-02 (×2): 5000 [IU] via SUBCUTANEOUS
  Filled 2017-12-01 (×2): qty 1

## 2017-12-01 MED ORDER — LIDOCAINE HCL (PF) 1 % IJ SOLN
INTRAMUSCULAR | Status: AC
  Filled 2017-12-01: qty 30

## 2017-12-01 MED ORDER — MIDAZOLAM HCL 2 MG/2ML IJ SOLN
INTRAMUSCULAR | Status: AC
  Filled 2017-12-01: qty 2

## 2017-12-01 MED ORDER — SODIUM CHLORIDE 0.9 % IV SOLN
INTRAVENOUS | Status: AC
  Administered 2017-12-01: 16:00:00 via INTRAVENOUS

## 2017-12-01 MED ORDER — SODIUM CHLORIDE 0.9% FLUSH: 3.0000 mL | INTRAVENOUS | Status: DC | PRN

## 2017-12-01 MED ORDER — FENTANYL CITRATE (PF) 100 MCG/2ML IJ SOLN
INTRAMUSCULAR | Status: DC | PRN
  Administered 2017-12-01: 25 ug via INTRAVENOUS

## 2017-12-01 MED ORDER — IOHEXOL 350 MG/ML SOLN
INTRAVENOUS | Status: DC | PRN
  Administered 2017-12-01: 100 mL via INTRA_ARTERIAL

## 2017-12-01 MED ORDER — METOPROLOL SUCCINATE ER 25 MG PO TB24
25.0000 mg | ORAL_TABLET | Freq: Every day | ORAL | Status: DC
  Administered 2017-12-01 – 2017-12-02 (×2): 25 mg via ORAL
  Filled 2017-12-01 (×2): qty 1

## 2017-12-01 MED ORDER — LIDOCAINE HCL (PF) 1 % IJ SOLN
INTRAMUSCULAR | Status: DC | PRN
  Administered 2017-12-01: 17 mL

## 2017-12-01 SURGICAL SUPPLY — 10 items
CATH INFINITI 5FR MULTPACK ANG (CATHETERS) ×2 IMPLANT
CATH SWAN GANZ 7F STRAIGHT (CATHETERS) ×2 IMPLANT
KIT HEART LEFT (KITS) ×2 IMPLANT
PACK CARDIAC CATHETERIZATION (CUSTOM PROCEDURE TRAY) ×2 IMPLANT
SHEATH PINNACLE 5F 10CM (SHEATH) ×2 IMPLANT
SHEATH PINNACLE 7F 10CM (SHEATH) ×2 IMPLANT
SYR MEDRAD MARK V 150ML (SYRINGE) ×2 IMPLANT
TRANSDUCER W/STOPCOCK (MISCELLANEOUS) ×2 IMPLANT
WIRE EMERALD 3MM-J .025X260CM (WIRE) ×2 IMPLANT
WIRE EMERALD 3MM-J .035X150CM (WIRE) ×2 IMPLANT

## 2017-12-01 NOTE — Progress Notes (Signed)
Progress Note  Patient Name: Stephanie Bray Date of Encounter: 12/01/2017  Primary Cardiologist: No primary care provider on file.   Subjective   Seen after return from cath lab. Denies any chest tightness or shortness of breath. Would like to go home tomorrow if possible as she cares for her boys at home, the youngest of whom has special needs.  Inpatient Medications    Scheduled Meds: . aspirin  81 mg Oral Daily  . atorvastatin  80 mg Oral q1800  . busPIRone  10 mg Oral TID  . darifenacin  7.5 mg Oral Daily  . ferrous sulfate  325 mg Oral QODAY  . heparin  5,000 Units Subcutaneous Q8H  . insulin aspart  0-5 Units Subcutaneous QHS  . insulin aspart  0-9 Units Subcutaneous TID WC  . metoprolol tartrate  25 mg Oral Daily  . nitroGLYCERIN  1 inch Topical Q6H  . omega-3 acid ethyl esters  1 g Oral Daily  . pantoprazole  40 mg Oral Daily  . pneumococcal 23 valent vaccine  0.5 mL Intramuscular Tomorrow-1000  . sodium chloride flush  3 mL Intravenous Q12H   Continuous Infusions: . sodium chloride 75 mL/hr at 12/01/17 1120  . sodium chloride    . cefTRIAXone (ROCEPHIN)  IV 1 g (12/01/17 0414)  . nitroGLYCERIN     PRN Meds: sodium chloride, acetaminophen, ALPRAZolam, guaiFENesin-dextromethorphan, hydrALAZINE, morphine injection, nitroGLYCERIN, ondansetron (ZOFRAN) IV, sodium chloride, sodium chloride flush, traZODone, zolpidem   Vital Signs    Vitals:   12/01/17 1120 12/01/17 1132 12/01/17 1147 12/01/17 1200  BP: 136/73 129/87 125/79 112/72  Pulse: 85 89 85 81  Resp: (!) 28  (!) 21 16  Temp:   98.5 F (36.9 C)   TempSrc:   Oral   SpO2: 94% 93% 91% 91%  Weight:      Height:        Intake/Output Summary (Last 24 hours) at 12/01/2017 1259 Last data filed at 12/01/2017 0814 Gross per 24 hour  Intake 1348.52 ml  Output 975 ml  Net 373.52 ml   Filed Weights   11/29/17 2224 11/30/17 0647 12/01/17 0453  Weight: 162 lb (73.5 kg) 163 lb 12.8 oz (74.3 kg) 160 lb 11.2 oz  (72.9 kg)    Telemetry    NSR, RBBB - Personally Reviewed  ECG    NSR, RBBB, borderline LPFB - Personally Reviewed  Physical Exam   GEN: No acute distress.   Neck: supple, JVD not visualized while lying flat in bed, unable to sit up due to recent femoral cath Cardiac: regular S1 and S2, 4/6 holosystolic murmur best heard at LUSB.  Respiratory: Clear to auscultation bilaterally on anterolateral auscultation. GI: Soft, nontender, non-distended. Bowel sounds normal MS: No edema; No deformity. Neuro:  Nonfocal, moves all limbs independently Psych: Normal affect   Labs    Chemistry Recent Labs  Lab 11/29/17 2244  NA 139  K 3.6  CL 104  CO2 25  GLUCOSE 130*  BUN 14  CREATININE 0.56  CALCIUM 9.4  GFRNONAA >60  GFRAA >60  ANIONGAP 10     Hematology Recent Labs  Lab 11/29/17 2244 11/30/17 0331 12/01/17 0248  WBC 9.7 12.6* 11.8*  RBC 5.10 5.18* 4.89  HGB 11.8* 11.8* 11.1*  HCT 39.3 40.0 38.0  MCV 77.1* 77.2* 77.7*  MCH 23.1* 22.8* 22.7*  MCHC 30.0 29.5* 29.2*  RDW 15.9* 16.0* 16.1*  PLT 256 263 245    Cardiac Enzymes Recent Labs  Lab 11/30/17  0025 11/30/17 0331 11/30/17 0857 11/30/17 1611  TROPONINI 0.81* 1.58* 1.03* 0.49*    Recent Labs  Lab 11/29/17 2252  TROPIPOC 0.09*     BNP Recent Labs  Lab 11/30/17 0331  BNP 112.3*     DDimer No results for input(s): DDIMER in the last 168 hours.   Radiology    Dg Chest 2 View  Result Date: 11/29/2017 CLINICAL DATA:  Chest pain. EXAM: CHEST - 2 VIEW COMPARISON:  Radiographs 10/30/2015 FINDINGS: Post median sternotomy. Cardiomegaly with right aortic arch, unchanged from prior exam. No pulmonary edema, focal airspace disease, pleural effusion or pneumothorax. No acute osseous abnormalities. IMPRESSION: Chronic cardiomegaly and right-sided aortic arch. No acute findings. Electronically Signed   By: Jeb Levering M.D.   On: 11/29/2017 23:41    Cardiac Studies   R/LHC today:  Hemodynamic findings  consistent with moderate pulmonary hypertension.  Angiographically normal coronary arteries, somewhat tortuous  There is mild pulmonic valve stenosis.  LV end diastolic pressure is moderately elevated.  There is severe left ventricular systolic dysfunction.  The left ventricular ejection fraction is 25-35% by visual estimate.  There is trivial (1+) mitral regurgitation.  There is no aortic valve stenosis.  There is mild mitral valve prolapse.    Angiographically normal coronary arteries.  Severely reduced LVEF of roughly 25 to 30% with global hypokinesis (worse in the anterior and apical walls) -consistent with Takotsubo/stress-induced cardiomyopathy  Mild mitral prolapse noted but no significant MR.  Pulmonary valve gradient of roughly 15 mmHg on pullback.  Cardiac output/index by Fick: 4.18, 2.52.  Thermodilution 6.22, 3.74  No evidence of shunt by Qp/Qs=1; significant mixing of blood from IVC, coronary sinus and SVC (IVC sat 76%, SVC sat 66%, RA sat 64% -> RV sat 74%, PA sat 75%)  Echo 11/30/17 Left ventricle: There is evidence of perimembranous or   supracristal VSD by colorflow Doppler with peak gradient of   19mmHg. The cavity size was mildly dilated. Systolic function was   moderately reduced. The estimated ejection fraction was in the   range of 35% to 40%. There is akinesis of the apical anterior,   apical septal, lateral, inferolateral, inferior, and apical   myocardium and suggestive of stress induced cardiomyopathy.   Features are consistent with a pseudonormal left ventricular   filling pattern, with concomitant abnormal relaxation and   increased filling pressure (grade 2 diastolic dysfunction).   Doppler parameters are consistent with high ventricular filling   pressure. - Mitral valve: Calcified annulus. Mild, late systolicsystolic   bowing without prolapse, involving the anterior leaflet. - Left atrium: The atrium was moderately dilated. - Atrial septum:  There was increased thickness of the septum,   consistent with lipomatous hypertrophy. - Tricuspid valve: There was trivial regurgitation. - Pulmonic valve: Pulmonic valve VTI 44.1 cm and peak velocity   235cm/sec with mean gradient of 43mmHg consistent with moderate   pulmonic stenosis. The findings are consistent with moderate   stenosis. - Pulmonary arteries: Systolic pressure could not be accurately   estimated.  Impressions:  - Compared to prior echo, there is now akinesis of the apical   segments of the LV suggestive of stress cardiomyopathy with EF   35-40% with grade 2 DD. There is a perimembranous or suprcristal   VSD with a peak gradient of 53mmHg. Cannot accurately assess   PASP. There is evidence of pulmonic stenosis but the mean   gradient has decreased from 47mmHg to 43mmHg compared to prior   echo  Patient Profile     57 y.o. female with history of VSD s/p septal patch (noted leak on MRI at [facility] 2012), diabetes, hypertension, obesity who presented with CC of epistaxis and chest tightness. Echo showed drop in function compared to prior, but cath today notes no obstructive CAD and pulmonic gradient of 15 mmHg on pullback.   Assessment & Plan    Chest tightness, new cardiomyopathy: -PCWP=30 mmHg, PAP 63/22, mean 44. RA pressure 15 mmHg. Has room for diuresis. Will trial 40 mg IV lasix and monitor output. Cr has been stable around 0.55 -had elevated troponins but normal coronary arteries with wall motion abnormality. Nonischemic cardiomyopathy, consider takostubo vs. Microvascular -mild PS by gradients, has loud murmur. No intervention required. -no significant shunt based on cath calculations, but does have a step up from RA to RV (64% to 74%) suggestive of mixing. -stop nitroglycerin/nitropaste to better assess baseline blood pressure.  Medications: given concern for takotsubo's, continue metoprolol, though will change to succinate today (has not yet received dose).  Has cough with ACEI, if BP allows would trial low dose of losartan given her diabetes and EF. With no obvious CAD, could consider stopping aspirin if epistaxis continues. VSD patch placed in childhood, should be endothelialized.  If she does well overnight and renal function stable, consider discharge tomorrow.   Time Spent Directly with Patient: I have spent a total of 35 minutes with the patient reviewing cardiac procedure images and reports, hospital notes, telemetry, EKGs, labs and examining the patient as well as establishing an assessment and plan that was discussed personally with the patient.  > 50% of time was spent in direct patient care.  Length of Stay:  LOS: 1 day   Buford Dresser, MD, PhD [facility]  [facility]   12/01/2017, 12:59 PM      For questions or updates, please contact [place] Please consult [email or web address] for contact info under Cardiology/STEMI.

## 2017-12-01 NOTE — Progress Notes (Signed)
PROGRESS NOTE    Stephanie Bray  IRC:[id number] DOB: 1960-12-13 DOA: 11/29/2017 PCP: Patient, No Pcp Per   Brief Narrative:57 y.o. female with medical history significant of hypertension, hyperlipidemia, diabetes, TIA, GERD, depression with anxiety, iron deficiency anemia, migraine headaches, small VSD, who presents with chest pain, epistaxis.  Patient states that she has been having intermittent chest pain for about 3 days.  The chest pain is located in the left side of chest, intermittent, 2 or 3 times each day, lasting for about 30 minutes each time, resolves spontaneously.  No shortness of breath.  No recent long distance traveling.  No tenderness in the calf areas.  Patient does not have cough, fever or chills.  Patient states that she has been having 4 days of epistaxis in the left nasal nare.  She has nose bleeding again today, that is why she comes to the emergency room for treatment.  Nosebleeding has resolved continuously in ED. Patient does not have lightheadedness or dizziness.  Denies nausea, vomiting, diarrhea, abdominal pain.  She states that she has increased urinary frequency recently, but no dysuria or burning on urination.  Patient does not have bleeding tendency on small skin cut or brushing teeth.  ED Course: pt was found to have troponin 0.81, WBC 9.7, electrolytes renal function okay, positive urinalysis (moderate amount of leukocyte, WBC>50, hazy appearance, but no bacteria), temperature normal, no tachycardia, oxygen saturation 98% on room air, chest x-ray showed cardiomegaly without pulmonary edema or infiltration.  Patient is admitted to telemetry bed as inpatient.  Cardiology, Dr. Raiford Simmonds was consulted   Assessment & Plan:   Principal Problem:   NSTEMI (non-ST elevated myocardial infarction) [facility]) Active Problems:   Depression   Diabetes mellitus without complication ([place])   HLD (hyperlipidemia)   Iron deficiency anemia   GERD (gastroesophageal reflux disease)  Essential hypertension   Epistaxis   Paramembranous VSD   Pulmonic stenosis, congenital   Dilated cardiomyopathy ([place])   1] non-ST elevation MI patient admitted with chest pain troponin peaked at 1.58.  She denied any chest pain overnight , nitro drip has been stopped yesterday heparin drip continuing.  Patient to have left and right cardiac cath today.  2] epistaxis resolved hemoglobin stable  3] type 2 diabetes well controlled continue SSI hold metformin.  Hemoglobin A1c upon this admission  4]Depression and anxiety: Stable, no suicidal or homicidal ideations. -Continue home medications: BuSpar -prn xanax  HLD (hyperlipidemia): -change home zocor to lipitor 80 mg daily due to NSTEMi  Iron deficiency anemia: Hemoglobin stable, 11.8 -continue iron supplement  GERD: -Protonix  Essential hypertension: -continue metoprolol -IV hydralazine as needed  UTI: -IV rocephine -f/u Ux and bx     DVT prophylaxis: Heparin Code Status full code Disposition Plan: TBD  consultants cardiology  Procedures: None Antimicrobials Rocephin Subjective: Resting in bed in no acute distress denies any chest pain slept well anxious about the procedure no other new complaints.   Objective: Vitals:   12/01/17 0300 12/01/17 0453 12/01/17 0729 12/01/17 0800  BP:  109/67 109/70 128/78  Pulse:  80 79 79  Resp: 19  (!) 24 19  Temp:  98.7 F (37.1 C) 99.1 F (37.3 C)   TempSrc:  Oral Oral   SpO2:  94% 96% 94%  Weight:  72.9 kg (160 lb 11.2 oz)    Height:        Intake/Output Summary (Last 24 hours) at 12/01/2017 1005 Last data filed at 12/01/2017 0814 Gross per 24 hour  Intake  1569.53 ml  Output 975 ml  Net 594.53 ml   Filed Weights   11/29/17 2224 11/30/17 0647 12/01/17 0453  Weight: 73.5 kg (162 lb) 74.3 kg (163 lb 12.8 oz) 72.9 kg (160 lb 11.2 oz)    Examination:  General exam: Appears calm and comfortable  Respiratory system: Clear to auscultation. Respiratory effort  normal. Cardiovascular system: S1 & S2 heard, RRR. No JVD, murmurs, rubs, gallops or clicks. No pedal edema. Gastrointestinal system: Abdomen is nondistended, soft and nontender. No organomegaly or masses felt. Normal bowel sounds heard. Central nervous system: Alert and oriented. No focal neurological deficits. Extremities: Symmetric 5 x 5 power. Skin: No rashes, lesions or ulcers Psychiatry: Judgement and insight appear normal. Mood & affect appropriate.     Data Reviewed: I have personally reviewed following labs and imaging studies  CBC: Recent Labs  Lab 11/29/17 2244 11/30/17 0331 12/01/17 0248  WBC 9.7 12.6* 11.8*  HGB 11.8* 11.8* 11.1*  HCT 39.3 40.0 38.0  MCV 77.1* 77.2* 77.7*  PLT 256 263 176   Basic Metabolic Panel: Recent Labs  Lab 11/29/17 2244  NA 139  K 3.6  CL 104  CO2 25  GLUCOSE 130*  BUN 14  CREATININE 0.56  CALCIUM 9.4   GFR: Estimated Creatinine Clearance: 66.6 mL/min (by C-G formula based on SCr of 0.56 mg/dL). Liver Function Tests: No results for input(s): AST, ALT, ALKPHOS, BILITOT, PROT, ALBUMIN in the last 168 hours. No results for input(s): LIPASE, AMYLASE in the last 168 hours. No results for input(s): AMMONIA in the last 168 hours. Coagulation Profile: Recent Labs  Lab 11/30/17 0331  INR 1.09   Cardiac Enzymes: Recent Labs  Lab 11/30/17 0025 11/30/17 0331 11/30/17 0857 11/30/17 1611  TROPONINI 0.81* 1.58* 1.03* 0.49*   BNP (last 3 results) No results for input(s): PROBNP in the last 8760 hours. HbA1C: Recent Labs    11/30/17 0332  HGBA1C 6.4*   CBG: Recent Labs  Lab 11/30/17 1225 11/30/17 1638 11/30/17 2118 12/01/17 0811  GLUCAP 136* 139* 127* 136*   Lipid Profile: Recent Labs    11/30/17 0332  CHOL 169  HDL 57  LDLCALC 105*  TRIG 37  CHOLHDL 3.0   Thyroid Function Tests: No results for input(s): TSH, T4TOTAL, FREET4, T3FREE, THYROIDAB in the last 72 hours. Anemia Panel: No results for input(s):  VITAMINB12, FOLATE, FERRITIN, TIBC, IRON, RETICCTPCT in the last 72 hours. Sepsis Labs: No results for input(s): PROCALCITON, LATICACIDVEN in the last 168 hours.  Recent Results (from the past 240 hour(s))  MRSA PCR Screening     Status: None   Collection Time: 11/30/17  6:46 AM  Result Value Ref Range Status   MRSA by PCR NEGATIVE NEGATIVE Final    Comment:        The GeneXpert MRSA Assay (FDA approved for NASAL specimens only), is one component of a comprehensive MRSA colonization surveillance program. It is not intended to diagnose MRSA infection nor to guide or monitor treatment for MRSA infections. Performed at [facility], [place] [address], [place], [place] [address]          Radiology Studies: Dg Chest 2 View  Result Date: 11/29/2017 CLINICAL DATA:  Chest pain. EXAM: CHEST - 2 VIEW COMPARISON:  Radiographs 10/30/2015 FINDINGS: Post median sternotomy. Cardiomegaly with right aortic arch, unchanged from prior exam. No pulmonary edema, focal airspace disease, pleural effusion or pneumothorax. No acute osseous abnormalities. IMPRESSION: Chronic cardiomegaly and right-sided aortic arch. No acute findings. Electronically Signed  By: Jeb Levering M.D.   On: 11/29/2017 23:41        Scheduled Meds: . [MAR Hold] aspirin  81 mg Oral Daily  . [MAR Hold] atorvastatin  80 mg Oral q1800  . [MAR Hold] busPIRone  10 mg Oral TID  . [MAR Hold] darifenacin  7.5 mg Oral Daily  . [MAR Hold] ferrous sulfate  325 mg Oral QODAY  . [MAR Hold] insulin aspart  0-5 Units Subcutaneous QHS  . [MAR Hold] insulin aspart  0-9 Units Subcutaneous TID WC  . [MAR Hold] metoprolol tartrate  25 mg Oral Daily  . [MAR Hold] nitroGLYCERIN  1 inch Topical Q6H  . [MAR Hold] omega-3 acid ethyl esters  1 g Oral Daily  . [MAR Hold] pantoprazole  40 mg Oral Daily  . [MAR Hold] pneumococcal 23 valent vaccine  0.5 mL Intramuscular Tomorrow-1000  . sodium chloride flush  3 mL Intravenous Q12H    Continuous Infusions: . sodium chloride    . sodium chloride 1 mL/kg/hr (12/01/17 0730)  . [MAR Hold] cefTRIAXone (ROCEPHIN)  IV 1 g (12/01/17 0414)  . heparin 1,050 Units/hr (12/01/17 0730)  . [MAR Hold] nitroGLYCERIN       LOS: 1 day        Georgette Shell, MD [facility]  If 7PM-7AM, please contact night-coverage [email or web address] Password [email or web address] 12/01/2017, 10:05 AM

## 2017-12-01 NOTE — Interval H&P Note (Signed)
History and Physical Interval Note:  Briefly seen at the bedside. No further chest pain or epistaxis noted. Troponin trended up to 1.58. Story is interesting - History of congenital heart disease - she had VSD repair at [facility] when she was 70 months old, but apparently the repair did not hold - does have a typical VSD murmur. 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 Not followed locally by cardiology. Repeat echo is pending. Will plan for [facility] tomorrow to assess coronaries and evaluate degree of PS by pullback. Discussed risks, benefits and alternatives of cardiac cath with her and she is willing to proceed. Please keep NPO p MN - see signed/held orders.   Stephanie Casino, MD, [facility], FACP   12/01/2017 8:47 AM  Stephanie Bray  has presented today for surgery, with the diagnosis of NSTEMI with Valvular Heart Disease (? Pulmonic Stenosis)  The various methods of treatment have been discussed with the patient and family. After consideration of risks, benefits and other options for treatment, the patient has consented to  Procedure(s): RIGHT/LEFT HEART CATH AND CORONARY ANGIOGRAPHY (N/A) as a surgical intervention .  The patient's history has been reviewed, patient examined, no change in status, stable for surgery.  I have reviewed the patient's chart and labs.  Questions were answered to the patient's satisfaction.    Cath Lab Visit (complete for each Cath Lab visit)  Clinical Evaluation Leading to the Procedure:   ACS: Yes.    Non-ACS:    Anginal Classification: CCS III  Anti-ischemic medical therapy: Maximal Therapy (2 or more classes of medications)  Non-Invasive Test Results: No non-invasive testing performed - But Abnormal Echo  Prior CABG: No previous CABG   Stephanie Bray

## 2017-12-01 NOTE — Progress Notes (Signed)
Site area: rt groin fa and fv sheaths Site Prior to Removal:  Level 0 Pressure Applied For: 20 minutes Manual:   yes Patient Status During Pull:  stable Post Pull Site:  Level 0 Post Pull Instructions Given:  yes Post Pull Pulses Present: rt dp palpable Dressing Applied:  Gauze and tegaderm Bedrest begins @ 3007 Comments:

## 2017-12-02 ENCOUNTER — Telehealth: Payer: Self-pay | Admitting: Adult Health

## 2017-12-02 DIAGNOSIS — E119 Type 2 diabetes mellitus without complications: Secondary | ICD-10-CM

## 2017-12-02 DIAGNOSIS — E785 Hyperlipidemia, unspecified: Secondary | ICD-10-CM

## 2017-12-02 LAB — GLUCOSE, CAPILLARY: GLUCOSE-CAPILLARY: 114 mg/dL — AB (ref 70–99)

## 2017-12-02 LAB — CBC
HCT: 36.5 % (ref 36.0–46.0)
Hemoglobin: 10.9 g/dL — ABNORMAL LOW (ref 12.0–15.0)
MCH: 23 pg — ABNORMAL LOW (ref 26.0–34.0)
MCHC: 29.9 g/dL — ABNORMAL LOW (ref 30.0–36.0)
MCV: 77.2 fL — ABNORMAL LOW (ref 78.0–100.0)
Platelets: 233 10*3/uL (ref 150–400)
RBC: 4.73 MIL/uL (ref 3.87–5.11)
RDW: 16.3 % — AB (ref 11.5–15.5)
WBC: 9.6 10*3/uL (ref 4.0–10.5)

## 2017-12-02 MED ORDER — ASPIRIN 81 MG PO CHEW: 81.0000 mg | CHEWABLE_TABLET | Freq: Every day | ORAL | Status: DC

## 2017-12-02 MED ORDER — METOPROLOL SUCCINATE ER 25 MG PO TB24: 25.0000 mg | ORAL_TABLET | Freq: Every day | ORAL | 1 refills | Status: DC

## 2017-12-02 MED ORDER — ATORVASTATIN CALCIUM 10 MG PO TABS: 10.0000 mg | ORAL_TABLET | Freq: Every day | ORAL | 11 refills | Status: DC

## 2017-12-02 NOTE — Progress Notes (Signed)
All discharge instructions and prescriptions reviewed with patient. Patient transported by wheelchair to her son's car for discharge.

## 2017-12-02 NOTE — Plan of Care (Signed)
  Problem: Education: Goal: Knowledge of General Education information will improve Description Including pain rating scale, medication(s)/side effects and non-pharmacologic comfort measures Outcome: Completed/Met   Problem: Health Behavior/Discharge Planning: Goal: Ability to manage health-related needs will improve Outcome: Completed/Met   Problem: Clinical Measurements: Goal: Ability to maintain clinical measurements within normal limits will improve Outcome: Completed/Met Goal: Will remain free from infection Outcome: Completed/Met Goal: Diagnostic test results will improve Outcome: Completed/Met Goal: Respiratory complications will improve Outcome: Completed/Met Goal: Cardiovascular complication will be avoided Outcome: Completed/Met   Problem: Activity: Goal: Risk for activity intolerance will decrease Outcome: Completed/Met   Problem: Nutrition: Goal: Adequate nutrition will be maintained Outcome: Completed/Met   Problem: Coping: Goal: Level of anxiety will decrease Outcome: Completed/Met   Problem: Elimination: Goal: Will not experience complications related to bowel motility Outcome: Completed/Met Goal: Will not experience complications related to urinary retention Outcome: Completed/Met   Problem: Pain Managment: Goal: General experience of comfort will improve Outcome: Completed/Met   Problem: Safety: Goal: Ability to remain free from injury will improve Outcome: Completed/Met   Problem: Skin Integrity: Goal: Risk for impaired skin integrity will decrease Outcome: Completed/Met   Problem: Education: Goal: Understanding of CV disease, CV risk reduction, and recovery process will improve Outcome: Completed/Met Goal: Individualized Educational Video(s) Outcome: Completed/Met   Problem: Activity: Goal: Ability to return to baseline activity level will improve Outcome: Completed/Met   Problem: Cardiovascular: Goal: Ability to achieve and maintain  adequate cardiovascular perfusion will improve Outcome: Completed/Met Goal: Vascular access site(s) Level 0-1 will be maintained Outcome: Completed/Met   Problem: Health Behavior/Discharge Planning: Goal: Ability to safely manage health-related needs after discharge will improve Outcome: Completed/Met  Pt being discharged home

## 2017-12-02 NOTE — Discharge Summary (Signed)
Physician Discharge Summary  Stephanie Bray KDT:[id number] DOB: November 15, 1960 DOA: 11/29/2017  PCP: Patient, No Pcp Per  Admit date: 11/29/2017 Discharge date: 12/02/2017  Admitted From: Home Disposition: Home  Recommendations for Outpatient Follow-up:  1. Follow up with PCP in 1-2 weeks 2. Please obtain BMP/CBC in one week 3. Follow-up with cardiology  [place]: None Equipment/Devices: None  Discharge Condition: Stable CODE STATUS full code  Diet recommendation: cardiac modified carb Brief/Interim Summary:57 y.o.femalewith medical history significant ofhypertension, hyperlipidemia, diabetes, TIA, GERD, depression with anxiety, iron deficiency anemia, migraine headaches, small VSD, who presents with chest pain, epistaxis.  Patient states that she has been having intermittent chest pain for about 3 days. The chest pain is located in the left side of chest, intermittent, 2 or 3 times each day, lasting for about 30 minutes each time, resolves spontaneously. No shortness of breath. No recent long distance traveling. No tenderness in the calf areas. Patient does not have cough, fever or chills. Patient states that she has been having 4 days of epistaxis in the left nasal nare. She has nosebleeding again today, that is why she comes to the emergency room for treatment. Nosebleeding has resolved continuously in ED. Patient does not have lightheadedness or dizziness. Denies nausea, vomiting, diarrhea, abdominal pain.She states that she has increased urinary frequency recently, but no dysuria or burning on urination. Patient does not have bleeding tendency on small skin cut orbrushing teeth.  ED Course:pt was found to havetroponin 0.81, WBC 9.7, electrolytes renal function okay, positive urinalysis (moderate amount of leukocyte, WBC>50, hazy appearance, but no bacteria),temperature normal, no tachycardia, oxygen saturation 98% on room air, chest x-ray showed cardiomegaly  without pulmonary edema orinfiltration. Patient is admitted to telemetry bed as inpatient. Cardiology, Dr. Raiford Simmonds was consulted    Discharge Diagnoses:  Principal Problem:   NSTEMI (non-ST elevated myocardial infarction) [facility]) Active Problems:   Depression   Diabetes mellitus without complication ([place])   HLD (hyperlipidemia)   Iron deficiency anemia   GERD (gastroesophageal reflux disease)   Essential hypertension   Epistaxis   Paramembranous VSD   Pulmonic stenosis, congenital   Dilated cardiomyopathy ([place])  1] new onset cardiomyopathy patient was admitted with complaints of shortness of breath and chest pain.  She was initially started on IV nitroglycerin and heparin.-Her troponin was elevated and maxed at 1.58.  She was taken for right and left heart catheterization.  Her cath showed findings consistent with moderate pulmonary hypertension normal coronary arteries, mild pulmonary stenosis, elevated left end-diastolic pressure, severe left ventricular systolic dysfunction 25 to 35% ejection fraction, no aortic stenosis pulmonary capillary wedge pressure elevated at 30.  She was treated with metoprolol succinate Lipitor and aspirin.  Patient had some epistaxis which has been resolved on its own.  Patient has cough with ACE inhibitor.  A trial of losartan was not done due to soft BP.  This is something that can be done done as an outpatient.  She has appointment with the cardiologist already made prior to discharge.  She was not started on an ACE inhibitor due to his soft blood pressure.  She was told to stop the aspirin if epistaxis recurs  Since that she does not have CAD.  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  2] type 2 diabetes restart metformin 12/04/2017 since she had her cardiac cath 12/01/2017.  3] hyperlipidemia she will be discharged on atorvastatin 10 mg daily.  4] anxiety continue home dose of  BuSpar.  5] UTI patient did not  have a UTI this admission.  She was started on Rocephin at the time of admission thinking she probably has a UTI.  Her urine culture is negative.  Blood cultures are pending.  Therefore I will DC her Rocephin and will not send her out on p.o. antibiotics.    Discharge Instructions  Discharge Instructions    Call MD for:  difficulty breathing, headache or visual disturbances   Complete by:  As directed    Call MD for:  extreme fatigue   Complete by:  As directed    Call MD for:  persistant dizziness or light-headedness   Complete by:  As directed    Call MD for:  persistant nausea and vomiting   Complete by:  As directed    Call MD for:  redness, tenderness, or signs of infection (pain, swelling, redness, odor or green/yellow discharge around incision site)   Complete by:  As directed    Call MD for:  severe uncontrolled pain   Complete by:  As directed    Call MD for:  temperature >100.4   Complete by:  As directed    Diet - low sodium heart healthy   Complete by:  As directed    Increase activity slowly   Complete by:  As directed      Allergies as of 12/02/2017      Reactions   Other Other (See Comments)   Reaction unknown, per son   Ace Inhibitors    Other reaction(s): Cough      Medication List    STOP taking these medications   benzonatate 100 MG capsule Commonly known as:  TESSALON   fluticasone 50 MCG/ACT nasal spray Commonly known as:  FLONASE   metoprolol tartrate 25 MG tablet Commonly known as:  LOPRESSOR   nitroGLYCERIN 0.4 MG SL tablet Commonly known as:  NITROSTAT   simvastatin 10 MG tablet Commonly known as:  ZOCOR     TAKE these medications   aspirin 81 MG chewable tablet Chew 1 tablet (81 mg total) by mouth daily.   atorvastatin 10 MG tablet Commonly known as:  LIPITOR Take 1 tablet (10 mg total) by mouth daily.   busPIRone 10 MG tablet Commonly known as:  BUSPAR Take 10 mg by mouth 3 (three) times daily.   Fish Oil 1000 MG Caps Take  1,000 mg by mouth daily.   Iron 325 (65 Fe) MG Tabs Take 325 mg by mouth every other day.   metFORMIN 500 MG 24 hr tablet Commonly known as:  GLUCOPHAGE-XR Take 500 mg by mouth daily with breakfast.   metoprolol succinate 25 MG 24 hr tablet Commonly known as:  TOPROL-XL Take 1 tablet (25 mg total) by mouth daily.   omeprazole 20 MG capsule Commonly known as:  PRILOSEC Take 20 mg by mouth daily.   traZODone 100 MG tablet Commonly known as:  DESYREL Take 200 mg by mouth at bedtime as needed for sleep.   VESICARE 5 MG tablet Generic drug:  solifenacin Take 5 mg by mouth.      Follow-up Information    Lendon Colonel, NP Follow up on 12/15/2017.   Specialties:  Nurse Practitioner, Radiology, Cardiology Why:  Please arrive 15 minutes early for your 9:30am cardiology appointment. Curt Bears is a Designer, jewellery who works closely with Dr. Buford Dresser, who you will follow with on future appointments Contact information: [address] [place] [address] [phone]  [place] Follow up.   Contact information: [place] [address] [phone]         Allergies  Allergen Reactions  . Other Other (See Comments)    Reaction unknown, per son  . Ace Inhibitors     Other reaction(s): Cough    Consultations:  [facility]   Procedures/Studies: Dg Chest 2 View  Result Date: 11/29/2017 CLINICAL DATA:  Chest pain. EXAM: CHEST - 2 VIEW COMPARISON:  Radiographs 10/30/2015 FINDINGS: Post median sternotomy. Cardiomegaly with right aortic arch, unchanged from prior exam. No pulmonary edema, focal airspace disease, pleural effusion or pneumothorax. No acute osseous abnormalities. IMPRESSION: Chronic cardiomegaly and right-sided aortic arch. No acute findings. Electronically Signed   By: Jeb Levering M.D.   On: 11/29/2017 23:41    (Echo, Carotid, EGD, Colonoscopy, ERCP)     Subjective:   Discharge Exam: Vitals:   12/02/17 0424 12/02/17 0722  BP: (!) 132/95 (!) 143/78  Pulse: 91 99  Resp: 15 (!) 28  Temp: 98.6 F (37 C) 99 F (37.2 C)  SpO2: 100%    Vitals:   12/01/17 2328 12/02/17 0300 12/02/17 0424 12/02/17 0722  BP: 119/70 (!) 98/56 (!) 132/95 (!) 143/78  Pulse: 93  91 99  Resp: 17 16 15  (!) 28  Temp: 98.4 F (36.9 C)  98.6 F (37 C) 99 F (37.2 C)  TempSrc: Oral  Oral Oral  SpO2: 98%  100%   Weight:      Height:        General: Pt is alert, awake, not in acute distress Cardiovascular: RRR, S1/S2 +, no rubs, no gallops Respiratory: CTA bilaterally, no wheezing, no rhonchi Abdominal: Soft, NT, ND, bowel sounds + Extremities: no edema, no cyanosis    The results of significant diagnostics from this hospitalization (including imaging, microbiology, ancillary and laboratory) are listed below for reference.     Microbiology: Recent Results (from the past 240 hour(s))  Urine Culture     Status: None   Collection Time: 11/30/17  2:59 AM  Result Value Ref Range Status   Specimen Description URINE, RANDOM  Final   Special Requests NONE  Final   Culture   Final    NO GROWTH Performed at [facility], [address], [place], [place] [address]    Report Status 12/01/2017 FINAL  Final  Culture, blood (Routine X 2) w Reflex to ID Panel     Status: None (Preliminary result)   Collection Time: 11/30/17  3:15 AM  Result Value Ref Range Status   Specimen Description BLOOD LEFT ARM  Final   Special Requests   Final    BOTTLES DRAWN AEROBIC AND ANAEROBIC Blood Culture results may not be optimal due to an excessive volume of blood received in culture bottles   Culture   Final    NO GROWTH 1 DAY Performed at [facility], [place] [address] [place], [place] [address]    Report Status PENDING  Incomplete  Culture, blood (Routine X 2) w Reflex to ID Panel     Status: None (Preliminary result)   Collection Time: 11/30/17  3:50 AM   Result Value Ref Range Status   Specimen Description BLOOD LEFT HAND  Final   Special Requests   Final    BOTTLES DRAWN AEROBIC ONLY Blood Culture results may not be optimal due to an excessive volume of blood received in culture bottles   Culture   Final  NO GROWTH 1 DAY Performed at [facility], [place] [address], [place], [place] [address]    Report Status PENDING  Incomplete  MRSA PCR Screening     Status: None   Collection Time: 11/30/17  6:46 AM  Result Value Ref Range Status   MRSA by PCR NEGATIVE NEGATIVE Final    Comment:        The GeneXpert MRSA Assay (FDA approved for NASAL specimens only), is one component of a comprehensive MRSA colonization surveillance program. It is not intended to diagnose MRSA infection nor to guide or monitor treatment for MRSA infections. Performed at [facility], [place] [address], [place], [place] [address]      Labs: BNP (last 3 results) Recent Labs    11/30/17 0331  BNP 151.7*   Basic Metabolic Panel: Recent Labs  Lab 11/29/17 2244 12/01/17 0248  NA 139  --   K 3.6  --   CL 104  --   CO2 25  --   GLUCOSE 130*  --   BUN 14  --   CREATININE 0.56 0.57  CALCIUM 9.4  --    Liver Function Tests: No results for input(s): AST, ALT, ALKPHOS, BILITOT, PROT, ALBUMIN in the last 168 hours. No results for input(s): LIPASE, AMYLASE in the last 168 hours. No results for input(s): AMMONIA in the last 168 hours. CBC: Recent Labs  Lab 11/29/17 2244 11/30/17 0331 12/01/17 0248 12/02/17 0328  WBC 9.7 12.6* 11.8* 9.6  HGB 11.8* 11.8* 11.1* 10.9*  HCT 39.3 40.0 38.0 36.5  MCV 77.1* 77.2* 77.7* 77.2*  PLT 256 263 245 233   Cardiac Enzymes: Recent Labs  Lab 11/30/17 0025 11/30/17 0331 11/30/17 0857 11/30/17 1611  TROPONINI 0.81* 1.58* 1.03* 0.49*   BNP: Invalid input(s): POCBNP CBG: Recent Labs  Lab 12/01/17 1057 12/01/17 1157 12/01/17 1706 12/01/17 2125 12/02/17 0759  GLUCAP 135* 126* 138* 136* 114*    D-Dimer No results for input(s): DDIMER in the last 72 hours. Hgb A1c Recent Labs    11/30/17 0332  HGBA1C 6.4*   Lipid Profile Recent Labs    11/30/17 0332  CHOL 169  HDL 57  LDLCALC 105*  TRIG 37  CHOLHDL 3.0   Thyroid function studies No results for input(s): TSH, T4TOTAL, T3FREE, THYROIDAB in the last 72 hours.  Invalid input(s): FREET3 Anemia work up No results for input(s): VITAMINB12, FOLATE, FERRITIN, TIBC, IRON, RETICCTPCT in the last 72 hours. Urinalysis    Component Value Date/Time   COLORURINE YELLOW 11/29/2017 2339   APPEARANCEUR HAZY (A) 11/29/2017 2339   LABSPEC 1.027 11/29/2017 2339   PHURINE 5.0 11/29/2017 2339   GLUCOSEU NEGATIVE 11/29/2017 2339   HGBUR NEGATIVE 11/29/2017 2339   BILIRUBINUR NEGATIVE 11/29/2017 2339   KETONESUR 20 (A) 11/29/2017 2339   PROTEINUR NEGATIVE 11/29/2017 2339   UROBILINOGEN 0.2 09/26/2014 1458   NITRITE NEGATIVE 11/29/2017 2339   LEUKOCYTESUR MODERATE (A) 11/29/2017 2339   Sepsis Labs Invalid input(s): PROCALCITONIN,  WBC,  LACTICIDVEN Microbiology Recent Results (from the past 240 hour(s))  Urine Culture     Status: None   Collection Time: 11/30/17  2:59 AM  Result Value Ref Range Status   Specimen Description URINE, RANDOM  Final   Special Requests NONE  Final   Culture   Final    NO GROWTH Performed at [facility], [place] [address] [place], [place] [address]    Report Status 12/01/2017 FINAL  Final  Culture, blood (Routine X  2) w Reflex to ID Panel     Status: None (Preliminary result)   Collection Time: 11/30/17  3:15 AM  Result Value Ref Range Status   Specimen Description BLOOD LEFT ARM  Final   Special Requests   Final    BOTTLES DRAWN AEROBIC AND ANAEROBIC Blood Culture results may not be optimal due to an excessive volume of blood received in culture bottles   Culture   Final    NO GROWTH 1 DAY Performed at [place] [address] [place], [place] [address]    Report Status  PENDING  Incomplete  Culture, blood (Routine X 2) w Reflex to ID Panel     Status: None (Preliminary result)   Collection Time: 11/30/17  3:50 AM  Result Value Ref Range Status   Specimen Description BLOOD LEFT HAND  Final   Special Requests   Final    BOTTLES DRAWN AEROBIC ONLY Blood Culture results may not be optimal due to an excessive volume of blood received in culture bottles   Culture   Final    NO GROWTH 1 DAY Performed at [facility], [place] [address], [place], [place] [address]    Report Status PENDING  Incomplete  MRSA PCR Screening     Status: None   Collection Time: 11/30/17  6:46 AM  Result Value Ref Range Status   MRSA by PCR NEGATIVE NEGATIVE Final    Comment:        The GeneXpert MRSA Assay (FDA approved for NASAL specimens only), is one component of a comprehensive MRSA colonization surveillance program. It is not intended to diagnose MRSA infection nor to guide or monitor treatment for MRSA infections. Performed at [facility], [place] [address], [place], [place] [address]      Time coordinating discharge: 33 minutes  SIGNED:   Georgette Shell, MD  [facility] 12/02/2017, 10:12 AM Pager   If 7PM-7AM, please contact night-coverage [email or web address] Password [email or web address]

## 2017-12-02 NOTE — Progress Notes (Signed)
Progress Note  Patient Name: Stephanie Bray Date of Encounter: 12/02/2017  Primary Cardiologist: No primary care provider on file.   Subjective   Sitting in chair this AM, doing well. No chest pain. No pain at site of femoral cath yesterday.  Inpatient Medications    Scheduled Meds: . aspirin  81 mg Oral Daily  . atorvastatin  80 mg Oral q1800  . busPIRone  10 mg Oral TID  . darifenacin  7.5 mg Oral Daily  . ferrous sulfate  325 mg Oral QODAY  . heparin  5,000 Units Subcutaneous Q8H  . insulin aspart  0-5 Units Subcutaneous QHS  . insulin aspart  0-9 Units Subcutaneous TID WC  . metoprolol succinate  25 mg Oral Daily  . omega-3 acid ethyl esters  1 g Oral Daily  . pantoprazole  40 mg Oral Daily  . pneumococcal 23 valent vaccine  0.5 mL Intramuscular Tomorrow-1000  . sodium chloride flush  3 mL Intravenous Q12H   Continuous Infusions: . sodium chloride    . cefTRIAXone (ROCEPHIN)  IV 1 g (12/02/17 0421)   PRN Meds: sodium chloride, acetaminophen, ALPRAZolam, guaiFENesin-dextromethorphan, hydrALAZINE, morphine injection, nitroGLYCERIN, ondansetron (ZOFRAN) IV, sodium chloride, sodium chloride flush, traZODone, zolpidem   Vital Signs    Vitals:   12/01/17 2328 12/02/17 0300 12/02/17 0424 12/02/17 0722  BP: 119/70 (!) 98/56 (!) 132/95 (!) 143/78  Pulse: 93  91 99  Resp: 17 16 15  (!) 28  Temp: 98.4 F (36.9 C)  98.6 F (37 C) 99 F (37.2 C)  TempSrc: Oral  Oral Oral  SpO2: 98%  100%   Weight:      Height:        Intake/Output Summary (Last 24 hours) at 12/02/2017 0849 Last data filed at 12/02/2017 0300 Gross per 24 hour  Intake 768 ml  Output 2200 ml  Net -1432 ml   Filed Weights   11/29/17 2224 11/30/17 0647 12/01/17 0453  Weight: 162 lb (73.5 kg) 163 lb 12.8 oz (74.3 kg) 160 lb 11.2 oz (72.9 kg)    Telemetry    NSR, RBBB - Personally Reviewed  ECG    No new since yesterday  Physical Exam   GEN: No acute distress.   Neck: supple, no appreciable  JVD Cardiac: regular S1 and S2, 4/6 holosystolic murmur best heard at LUSB.  Respiratory: Clear to auscultation bilaterally on anterolateral auscultation. GI: Soft, nontender, non-distended. Bowel sounds normal MS: No edema; No deformity. Right femoral cath site c/d/i without hematoma or bruit. Neuro:  Nonfocal, moves all limbs independently Psych: Normal affect   Labs    Chemistry Recent Labs  Lab 11/29/17 2244 12/01/17 0248  NA 139  --   K 3.6  --   CL 104  --   CO2 25  --   GLUCOSE 130*  --   BUN 14  --   CREATININE 0.56 0.57  CALCIUM 9.4  --   GFRNONAA >60 >60  GFRAA >60 >60  ANIONGAP 10  --      Hematology Recent Labs  Lab 11/30/17 0331 12/01/17 0248 12/02/17 0328  WBC 12.6* 11.8* 9.6  RBC 5.18* 4.89 4.73  HGB 11.8* 11.1* 10.9*  HCT 40.0 38.0 36.5  MCV 77.2* 77.7* 77.2*  MCH 22.8* 22.7* 23.0*  MCHC 29.5* 29.2* 29.9*  RDW 16.0* 16.1* 16.3*  PLT 263 245 233    Cardiac Enzymes Recent Labs  Lab 11/30/17 0025 11/30/17 0331 11/30/17 0857 11/30/17 1611  TROPONINI 0.81* 1.58* 1.03*  0.49*    Recent Labs  Lab 11/29/17 2252  TROPIPOC 0.09*     BNP Recent Labs  Lab 11/30/17 0331  BNP 112.3*     DDimer No results for input(s): DDIMER in the last 168 hours.   Radiology    No results found.  Cardiac Studies   R/LHC today:  Hemodynamic findings consistent with moderate pulmonary hypertension.  Angiographically normal coronary arteries, somewhat tortuous  There is mild pulmonic valve stenosis.  LV end diastolic pressure is moderately elevated.  There is severe left ventricular systolic dysfunction.  The left ventricular ejection fraction is 25-35% by visual estimate.  There is trivial (1+) mitral regurgitation.  There is no aortic valve stenosis.  There is mild mitral valve prolapse.    Angiographically normal coronary arteries.  Severely reduced LVEF of roughly 25 to 30% with global hypokinesis (worse in the anterior and apical  walls) -consistent with Takotsubo/stress-induced cardiomyopathy  Mild mitral prolapse noted but no significant MR.  Pulmonary valve gradient of roughly 15 mmHg on pullback.  Cardiac output/index by Fick: 4.18, 2.52.  Thermodilution 6.22, 3.74  No evidence of shunt by Qp/Qs=1; significant mixing of blood from IVC, coronary sinus and SVC (IVC sat 76%, SVC sat 66%, RA sat 64% -> RV sat 74%, PA sat 75%)  Echo 11/30/17 Left ventricle: There is evidence of perimembranous or   supracristal VSD by colorflow Doppler with peak gradient of   68mmHg. The cavity size was mildly dilated. Systolic function was   moderately reduced. The estimated ejection fraction was in the   range of 35% to 40%. There is akinesis of the apical anterior,   apical septal, lateral, inferolateral, inferior, and apical   myocardium and suggestive of stress induced cardiomyopathy.   Features are consistent with a pseudonormal left ventricular   filling pattern, with concomitant abnormal relaxation and   increased filling pressure (grade 2 diastolic dysfunction).   Doppler parameters are consistent with high ventricular filling   pressure. - Mitral valve: Calcified annulus. Mild, late systolicsystolic   bowing without prolapse, involving the anterior leaflet. - Left atrium: The atrium was moderately dilated. - Atrial septum: There was increased thickness of the septum,   consistent with lipomatous hypertrophy. - Tricuspid valve: There was trivial regurgitation. - Pulmonic valve: Pulmonic valve VTI 44.1 cm and peak velocity   235cm/sec with mean gradient of 71mmHg consistent with moderate   pulmonic stenosis. The findings are consistent with moderate   stenosis. - Pulmonary arteries: Systolic pressure could not be accurately   estimated.  Impressions:  - Compared to prior echo, there is now akinesis of the apical   segments of the LV suggestive of stress cardiomyopathy with EF   35-40% with grade 2 DD. There is a  perimembranous or suprcristal   VSD with a peak gradient of 36mmHg. Cannot accurately assess   PASP. There is evidence of pulmonic stenosis but the mean   gradient has decreased from 72mmHg to 2mmHg compared to prior   echo  Patient Profile     57 y.o. female with history of VSD s/p septal patch (noted leak on MRI at [facility] 2012), diabetes, hypertension, obesity who presented with CC of epistaxis and chest tightness. Echo showed drop in function compared to prior, but cath today notes no obstructive CAD and pulmonic gradient of 15 mmHg on pullback.   Assessment & Plan    Chest tightness, new cardiomyopathy: -PCWP=30 mmHg, PAP 63/22, mean 44. RA pressure 15 mmHg. Has room for diuresis.  Diuresed -1.2 L with this. Denies any shortness of breath. Appears near euvolemic today. Will need to follow up on discharge to see if she needs a weight based diuretic at home. -had elevated troponins but normal coronary arteries with wall motion abnormality. Nonischemic cardiomyopathy, consider takostubo vs. Microvascular -mild PS by gradients, has loud murmur. No intervention required. -no significant shunt based on cath calculations, but does have a step up from RA to RV (64% to 74%) suggestive of mixing. -will need to repeat echo in about 3 mos to monitor for improvement  Medications: -changed to metoprolol succinate 25 mg daily yesterday, continue this at home.  -had low BP overnight to 98/56, but this morning is 143/78. Has not received her metoprolol yet. Recommend discussing losartan addition as outpatient. -no further epistaxis, she wonders if it was her sinus medication. If bleeding recurs, would stop aspirin given lack of CAD and very distant VSD repair. -given her lack of CAD but her risk factor of diabetes, would place her on a moderate intensity statin at discharge. Stop simvastatin 20 mg, start atorvastatin 10 mg. Will allow for uptitration if needed. Has tolerated atorvastatin 80 mg in the  hospital.  We will arrange follow up for her with a female cardiologist per her request.   Time Spent Directly with Patient: I have spent a total of 40 minutes with the patient reviewing cardiac procedure images and reports, hospital notes, telemetry, EKGs, labs and examining the patient as well as establishing an assessment and plan that was discussed personally with the patient.  THis time was also spent for discharge planning. > 50% of time was spent in direct patient care.   Length of Stay:  LOS: 2 days   Buford Dresser, MD, PhD [facility]   12/02/2017, 8:49 AM   [facility] will sign off in anticipation of pending discharge.   Medication Recommendations:  Metoprolol succinate 25 mg, atorvastatin 10 mg Other recommendations (labs, testing, etc):  none Follow up as an outpatient:  Will arrange follow up with female cardiologist as outpatient.  For questions or updates, please contact [place] Please consult [email or web address] for contact info under Cardiology/STEMI.

## 2017-12-02 NOTE — Telephone Encounter (Signed)
NEW MESSAGE    TOC appt per Roby Lofts- PA  Appt made with K.Lawrence for 8/12/ @9 :30

## 2017-12-03 NOTE — Telephone Encounter (Signed)
Patient contacted regarding discharge from [facility] on 12/02/17.  Patient understands to follow up with provider K. Lawrence NP on 12/15/17 at 930am at [facility]. Patient understands discharge instructions? YES Patient understands medications and regiment? YES Patient understands to bring all medications to this visit? YES

## 2017-12-05 LAB — CULTURE, BLOOD (ROUTINE X 2)
CULTURE: NO GROWTH
Culture: NO GROWTH

## 2017-12-14 NOTE — Progress Notes (Addendum)
Cardiology Office Note   Date:  12/15/2017   ID:  Stephanie Bray, DOB 01/05/1961, MRN [id number]  PCP:  [facility]  Cardiologist: Dr. Harrell Gave  Chief Complaint  Patient presents with  . Follow-up    Pt states no Sx.   . Cardiomyopathy     History of Present Illness: Stephanie Bray is a 57 y.o. female who presents for posthospitalization follow-up after admission for chest pain.  Seen by Dr. Debara Pickett, Dr. Ellyn Hack, and Dr. Harrell Gave during cardiac evaluation.  Has not yet been assigned to a cardiologist.  She has a history of a distant VSD repair, and echo did not reveal significant abnormality, with other history to include diabetes.  During hospitalization the patient had a right heart cath and left heart cath completed on 12/01/2017.  This revealed angiographically normal coronary arteries, somewhat torturous, moderate pulmonary hypertension was noted, severe left ventricular systolic dysfunction with an EF of 25% to 35%, trivial mitral regurgitation, with mild mitral prolapse.  It was felt that the patient had global hypokinesis consistent with Takotsubo stress-induced cardiomyopathy.  Echocardiogram completed during hospitalization revealed a mildly dilated cavity size with LVEF of 35% to 40% with akinesis of the anterior apical, apical septal, lateral, inferior lateral, inferior, and apical myocardium suggestive of stress-induced cardiomyopathy.  Patient had grade 2 diastolic dysfunction.  The patient also had a perimembranous or supracristal VSD with peak gradient of 86 mmHg.  There was no evidence of pulmonic stenosis.  She was continued on metoprolol, intolerant of ACE inhibitor due to cough, consideration for low-dose losartan given her diabetes and reduced EF.  Consideration for stopping aspirin, in the setting of epistaxis which occurred during hospitalization.  It is recommended that echocardiogram be repeated in October to evaluate for improvement on  medication management.  She denies any further epistaxis. She has anxiety and finds that her HR goes up when she is nervous. She denies chest pain, dyspnea or fluid retention.   Past Medical History:  Diagnosis Date  . Anxiety   . Arthritis    "left hip" (04/25/2014)  . Chest pain    a. 04/2014 MV: small anteroapical perfusion defect - likely breast attenuation->low risk.  . Chronic bronchitis ([place])    "get it mostly q yr" (04/25/2014)  . Depression   . Essential hypertension   . GERD (gastroesophageal reflux disease)   . Heart murmur   . High cholesterol   . Iron deficiency anemia   . Migraine    "weekly, at least" (04/25/2014)  . VSD (ventricular septal defect)    a. 04/2014 Echo: EF 55-60%, PASP 33mmHg, small restrictive either supracristal or perimembranous VSD w/o PAH.    Past Surgical History:  Procedure Laterality Date  . CHOLECYSTECTOMY    . RIGHT/LEFT HEART CATH AND CORONARY ANGIOGRAPHY N/A 12/01/2017   Procedure: RIGHT/LEFT HEART CATH AND CORONARY ANGIOGRAPHY;  Surgeon: Leonie Man, MD;  Location: [facility];  Service: Cardiovascular;  Laterality: N/A;  . VSD REPAIR  ~ 1963   [facility]/notes 04/25/2014     Current Outpatient Medications  Medication Sig Dispense Refill  . aspirin 81 MG chewable tablet Chew 1 tablet (81 mg total) by mouth daily.    . busPIRone (BUSPAR) 10 MG tablet Take 10 mg by mouth 3 (three) times daily.    . Ferrous Sulfate (IRON) 325 (65 Fe) MG TABS Take 325 mg by mouth every other day.     . metFORMIN (GLUCOPHAGE-XR) 500 MG 24 hr  tablet Take 500 mg by mouth daily with breakfast.     . metoprolol succinate (TOPROL-XL) 25 MG 24 hr tablet Take 1 tablet (25 mg total) by mouth daily. 30 tablet 1  . Omega-3 Fatty Acids (FISH OIL) 1000 MG CAPS Take 1,000 mg by mouth daily.    Marland Kitchen omeprazole (PRILOSEC) 20 MG capsule Take 20 mg by mouth daily.     . traZODone (DESYREL) 100 MG tablet Take 200 mg by mouth at bedtime as needed for sleep.     .  VESICARE 5 MG tablet Take 5 mg by mouth.    Marland Kitchen atorvastatin (LIPITOR) 10 MG tablet Take 1 tablet (10 mg total) by mouth daily. 30 tablet 5  . clotrimazole-betamethasone (LOTRISONE) cream Apply 1 application topically 2 (two) times daily. TO AFFECTED AREA-GROIN 45 g 0   No current facility-administered medications for this visit.     Allergies:   Other and Ace inhibitors    Social History:  The patient  reports that she has never smoked. She has never used smokeless tobacco. She reports that she does not drink alcohol or use drugs.   Family History:  The patient's family history includes COPD in her mother; Hyperlipidemia in her father; Hypertension in her father.    ROS: All other systems are reviewed and negative. Unless otherwise mentioned in H&P    PHYSICAL EXAM: VS:  BP (!) 161/76   Pulse 76   Ht 4\' 10"  (1.473 m)   Wt 157 lb (71.2 kg)   BMI 32.81 kg/m  , BMI Body mass index is 32.81 kg/m. GEN: Well nourished, well developed, in no acute distress  HEENT: normal  Neck: no JVD, carotid bruits, or masses Cardiac: RRR [id number];1/2 holosystolic murmurs, rubs, or gallops,no edema  Respiratory:  Clear to auscultation bilaterally, normal work of breathing GI: soft, nontender, nondistended, + BS MS: no deformity or atrophy Candidiasis of the groin bilaterally. Cath insertion site is well healed.  Skin: warm and dry, no rash Neuro:  Strength and sensation are intact Psych: euthymic mood, full affect   EKG:  Not completed this office visit.   Recent Labs: 11/29/2017: BUN 14; Potassium 3.6; Sodium 139 12-07-2017: B Natriuretic Peptide 112.3 12/01/2017: Creatinine, Ser 0.57 12/02/2017: Hemoglobin 10.9; Platelets 233    Lipid Panel    Component Value Date/Time   CHOL 169 2017-12-07 0332   TRIG 37 12/07/2017 0332   HDL 57 07-Dec-2017 0332   CHOLHDL 3.0 12-07-2017 0332   VLDL 7 2017/12/07 0332   LDLCALC 105 (H) 2017/12/07 0332      Wt Readings from Last 3 Encounters:    12/15/17 157 lb (71.2 kg)  12/01/17 160 lb 11.2 oz (72.9 kg)  10/30/15 165 lb (74.8 kg)    Other studies Reviewed: Echocardiogram 12-07-2017 Left ventricle: There is evidence of perimembranous or   supracristal VSD by colorflow Doppler with peak gradient of   31mmHg. The cavity size was mildly dilated. Systolic function was   moderately reduced. The estimated ejection fraction was in the   range of 35% to 40%. There is akinesis of the apical anterior,   apical septal, lateral, inferolateral, inferior, and apical   myocardium and suggestive of stress induced cardiomyopathy.   Features are consistent with a pseudonormal left ventricular   filling pattern, with concomitant abnormal relaxation and   increased filling pressure (grade 2 diastolic dysfunction).   Doppler parameters are consistent with high ventricular filling   pressure. - Mitral valve: Calcified annulus. Mild, late systolicsystolic  bowing without prolapse, involving the anterior leaflet. - Left atrium: The atrium was moderately dilated. - Atrial septum: There was increased thickness of the septum,   consistent with lipomatous hypertrophy. - Tricuspid valve: There was trivial regurgitation. - Pulmonic valve: Pulmonic valve VTI 44.1 cm and peak velocity   235cm/sec with mean gradient of 62mmHg consistent with moderate   pulmonic stenosis. The findings are consistent with moderate   stenosis. - Pulmonary arteries: Systolic pressure could not be accurately   estimated.  Impressions:  - Compared to prior echo, there is now akinesis of the apical   segments of the LV suggestive of stress cardiomyopathy with EF   35-40% with grade 2 DD. There is a perimembranous or suprcristal   VSD with a peak gradient of 59mmHg. Cannot accurately assess   PASP. There is evidence of pulmonic stenosis but the mean   gradient has decreased from 63mmHg to 53mmHg compared to priorLeft ventricle: There is evidence of perimembranous or    supracristal VSD by colorflow Doppler with peak gradient of   58mmHg. The cavity size was mildly dilated. Systolic function was   moderately reduced. The estimated ejection fraction was in the   range of 35% to 40%. There is akinesis of the apical anterior,   apical septal, lateral, inferolateral, inferior, and apical   myocardium and suggestive of stress induced cardiomyopathy.   Features are consistent with a pseudonormal left ventricular   filling pattern, with concomitant abnormal relaxation and   increased filling pressure (grade 2 diastolic dysfunction).   Doppler parameters are consistent with high ventricular filling   pressure. - Mitral valve: Calcified annulus. Mild, late systolicsystolic   bowing without prolapse, involving the anterior leaflet. - Left atrium: The atrium was moderately dilated. - Atrial septum: There was increased thickness of the septum,   consistent with lipomatous hypertrophy. - Tricuspid valve: There was trivial regurgitation. - Pulmonic valve: Pulmonic valve VTI 44.1 cm and peak velocity   235cm/sec with mean gradient of 39mmHg consistent with moderate   pulmonic stenosis. The findings are consistent with moderate   stenosis. - Pulmonary arteries: Systolic pressure could not be accurately   estimated.  Impressions:  - Compared to prior echo, there is now akinesis of the apical   segments of the LV suggestive of stress cardiomyopathy with EF   35-40% with grade 2 DD. There is a perimembranous or suprcristal   VSD with a peak gradient of 82mmHg. Cannot accurately assess   PASP. There is evidence of pulmonic stenosis but the mean   gradient has decreased from 33mmHg to 29mmHg compared to prior   Cardiac Cath 12/01/2017 Conclusion     Hemodynamic findings consistent with moderate pulmonary hypertension.  Angiographically normal coronary arteries, somewhat tortuous  There is mild pulmonic valve stenosis.  LV end diastolic pressure is  moderately elevated.  There is severe left ventricular systolic dysfunction.  The left ventricular ejection fraction is 25-35% by visual estimate.  There is trivial (1+) mitral regurgitation.  There is no aortic valve stenosis.  There is mild mitral valve prolapse.    Angiographically normal coronary arteries.  Severely reduced LVEF of roughly 25 to 30% with global hypokinesis (worse in the anterior and apical walls) -consistent with Takotsubo/stress-induced cardiomyopathy  Mild mitral prolapse noted but no significant MR.  Pulmonary valve gradient of roughly 15 mmHg on pullback.  Cardiac output/index by Fick: 4.18, 2.52.  Thermodilution 6.22, 3.74  No evidence of shunt by Qp/Qs=1; significant mixing of blood from IVC,  coronary sinus and SVC (IVC sat 76%, SVC sat 66%, RA sat 64% -> RV sat 74%, PA sat 75%)  The patient will be transferred to [facility] post procedure unit for post femoral catheterization.  Sheath removal per protocol with manual pressure held.     ASSESSMENT AND PLAN:  1. Takotsubo Cardiomyopathy: She continues on metoprolol for HR control. She HR is normal today. No changes on her heart rate medications.   2. Hypertension: Known history of [place] syndrome and anxiety. BP was rechecked and decreased some to 155/88.. She may need further titration of antihypertensive medications if she remains elevated. She does not tolerate ACE due to coughing, consider low dose amlodipine on next office follow up.   3.Valvular Heart Disease: No prolapse or regurgitation, but was found to have late systolic bowing noted. Trivial TR. Moderate pulmonic stenosis with mean gradient of 11 mmHg. She is due to have a repeat echocardiogram in October of 2019 and will need to follow up with Dr. Harrell Gave for ongoing management.   4. Hypercholesterolemia: She was to have atorvastatin 10 mg ordered on discharge, but was not done. I will order this today.   5. Diabetes: To see PCP for  ongoing management. She is to make that appointment She does not check her sugar at home because she states insurance will not pay for it at this time.   6. Candidiasis; Bilateral groins. She is given Rx for Lotrisone.  Current medicines are reviewed at length with the patient today.    Labs/ tests ordered today include: Echo,   Phill Myron. West Pugh, ANP, AACC   12/15/2017 11:14 AM    [facility]. [address] [place], [place] [address] Phone: [phone]; Fax: [phone]

## 2017-12-15 ENCOUNTER — Ambulatory Visit (INDEPENDENT_AMBULATORY_CARE_PROVIDER_SITE_OTHER): Payer: Medicare Other | Admitting: Adult Health

## 2017-12-15 ENCOUNTER — Encounter: Payer: Self-pay | Admitting: Adult Health

## 2017-12-15 VITALS — BP 161/76 | HR 76 | Ht <= 58 in | Wt 157.0 lb

## 2017-12-15 DIAGNOSIS — I059 Rheumatic mitral valve disease, unspecified: Secondary | ICD-10-CM

## 2017-12-15 DIAGNOSIS — I43 Cardiomyopathy in diseases classified elsewhere: Secondary | ICD-10-CM | POA: Diagnosis not present

## 2017-12-15 DIAGNOSIS — I10 Essential (primary) hypertension: Secondary | ICD-10-CM

## 2017-12-15 DIAGNOSIS — I370 Nonrheumatic pulmonary valve stenosis: Secondary | ICD-10-CM | POA: Diagnosis not present

## 2017-12-15 DIAGNOSIS — E7800 Pure hypercholesterolemia, unspecified: Secondary | ICD-10-CM

## 2017-12-15 MED ORDER — ATORVASTATIN CALCIUM 10 MG PO TABS: 10.0000 mg | ORAL_TABLET | Freq: Every day | ORAL | 5 refills | Status: DC

## 2017-12-15 MED ORDER — CLOTRIMAZOLE-BETAMETHASONE 1-0.05 % EX CREA: 1.0000 "application " | TOPICAL_CREAM | Freq: Two times a day (BID) | CUTANEOUS | 0 refills | Status: DC

## 2017-12-15 NOTE — Patient Instructions (Signed)
Medication Instructions:  START ATORVASTATIN 10MG  IN THE EVENING  APPLY LOTRIMIN TO AFFECTED AREA TWICE DAILY=AS NEEDED  If you need a refill on your cardiac medications before your next appointment, please call your pharmacy.  Testing/Procedures: Echocardiogram - Your physician has requested that you have an echocardiogram. Echocardiography is a painless test that uses sound waves to create images of your heart. It provides your doctor with information about the size and shape of your heart and how well your heart's chambers and valves are working. This procedure takes approximately one hour. There are no restrictions for this procedure. This will be performed at our [facility] location - [address], [address].  Special Instructions: MAKE SURE TO TALK TO PCP ABOUT ANTIFUNGAL  Follow-Up: Your physician wants you to follow-up in: November [place].    Thank you for choosing [facility] at [facility]!!

## 2017-12-16 ENCOUNTER — Telehealth: Payer: Self-pay | Admitting: Family Medicine

## 2017-12-16 NOTE — Telephone Encounter (Signed)
Pt was denied Clobetasol Propionate 0.05% Cream. Letter placed in providers box.

## 2017-12-25 ENCOUNTER — Other Ambulatory Visit: Payer: Self-pay | Admitting: Adult Health

## 2018-02-17 ENCOUNTER — Ambulatory Visit (HOSPITAL_COMMUNITY): Payer: Medicare Other | Attending: Cardiovascular Disease

## 2018-02-17 ENCOUNTER — Other Ambulatory Visit: Payer: Self-pay

## 2018-02-17 DIAGNOSIS — I059 Rheumatic mitral valve disease, unspecified: Secondary | ICD-10-CM

## 2018-02-17 DIAGNOSIS — I370 Nonrheumatic pulmonary valve stenosis: Secondary | ICD-10-CM

## 2018-03-23 ENCOUNTER — Ambulatory Visit (INDEPENDENT_AMBULATORY_CARE_PROVIDER_SITE_OTHER): Payer: Medicare Other | Admitting: Cardiology

## 2018-03-23 ENCOUNTER — Encounter: Payer: Self-pay | Admitting: Cardiology

## 2018-03-23 VITALS — BP 138/76 | HR 71 | Ht <= 58 in | Wt 152.6 lb

## 2018-03-23 DIAGNOSIS — Q221 Congenital pulmonary valve stenosis: Secondary | ICD-10-CM | POA: Diagnosis not present

## 2018-03-23 DIAGNOSIS — Q210 Ventricular septal defect: Secondary | ICD-10-CM

## 2018-03-23 DIAGNOSIS — I341 Nonrheumatic mitral (valve) prolapse: Secondary | ICD-10-CM | POA: Insufficient documentation

## 2018-03-23 DIAGNOSIS — I5181 Takotsubo syndrome: Secondary | ICD-10-CM | POA: Diagnosis not present

## 2018-03-23 NOTE — Patient Instructions (Signed)

## 2018-03-23 NOTE — Progress Notes (Signed)
Cardiology Office Note:    Date:  03/23/2018   ID:  Stephanie Bray, DOB 03-31-1961, MRN [id number]  PCP:  [facility]  Cardiologist:  Buford Dresser, MD PhD  Referring MD: [facility]*   CC: follow up, discuss results of testing  History of Present Illness:    Stephanie Bray is a 57 y.o. female with a hx of hypertension, hyperlipidemia, diabetes, TIA, VSD who is seen in follow up for the evaluation and management of recent takotsubo cardiomyopathy. She was discharged from this eipsode on and saw Jory Sims on 12/15/17 for post hospitalization follow up.  She was recently seen in the hospital for chest pain and epistaxis, discharged on 12/02/17. She underwent a right and left heart cath for further evaluation. Her coronaries were normal, though somewhat tortuous. She has moderate PH and severe LV dysfunction consistent with takotsubo cardiomyopathy. Her pulmonary valve gradient on pullback during RHC was 15 mmHg. No significant shunt by Qp/Qs.   She had a repeat echo 3 mos later, which showed improvement of her LVEF to 50-55%. She again had a perimembranous VSD noted. She was noted to have moderate anterior leaflet mitral valve prolapse without significant regurgitation. RV size and function was normal. Pulmonic valve gradient noted to be elevated from 11 to 21 mmHg compared to prior.  She presents today to follow up on these results. Overall she feels well and has no complaints. No chest pain, SOB, PND, orthopnea, LE edema, syncope. She is working on diet and activity changes. We reviewed the results of her testing extensively.  Past Medical History:  Diagnosis Date  . Anxiety   . Arthritis    "left hip" (04/25/2014)  . Chest pain    a. 04/2014 MV: small anteroapical perfusion defect - likely breast attenuation->low risk.  . Chronic bronchitis ([place])    "get it mostly q yr" (04/25/2014)  . Depression   . Essential hypertension   .  GERD (gastroesophageal reflux disease)   . Heart murmur   . High cholesterol   . Iron deficiency anemia   . Migraine    "weekly, at least" (04/25/2014)  . VSD (ventricular septal defect)    a. 04/2014 Echo: EF 55-60%, PASP 73mmHg, small restrictive either supracristal or perimembranous VSD w/o PAH.    Past Surgical History:  Procedure Laterality Date  . CHOLECYSTECTOMY    . RIGHT/LEFT HEART CATH AND CORONARY ANGIOGRAPHY N/A 12/01/2017   Procedure: RIGHT/LEFT HEART CATH AND CORONARY ANGIOGRAPHY;  Surgeon: Leonie Man, MD;  Location: [facility];  Service: Cardiovascular;  Laterality: N/A;  . VSD REPAIR  ~ 1963   [facility]/notes 04/25/2014    Current Medications: Current Outpatient Medications on File Prior to Visit  Medication Sig  . busPIRone (BUSPAR) 10 MG tablet Take 10 mg by mouth 3 (three) times daily.  . clotrimazole-betamethasone (LOTRISONE) cream Apply 1 application topically 2 (two) times daily. TO AFFECTED AREA-GROIN  . Ferrous Sulfate (IRON) 325 (65 Fe) MG TABS Take 325 mg by mouth every other day.   . metFORMIN (GLUCOPHAGE-XR) 500 MG 24 hr tablet Take 500 mg by mouth daily with breakfast.   . metoprolol succinate (TOPROL-XL) 25 MG 24 hr tablet Take 1 tablet (25 mg total) by mouth daily.  . nitroGLYCERIN (NITROSTAT) 0.4 MG SL tablet Place 1 tablet (0.4 mg total) under the tongue every 5 (five) minutes as needed for chest pain.  . Omega-3 Fatty Acids (FISH OIL) 1000 MG CAPS Take 1,000  mg by mouth daily.  Marland Kitchen omeprazole (PRILOSEC) 20 MG capsule Take 20 mg by mouth daily.   . simvastatin (ZOCOR) 20 MG tablet Take 20 mg by mouth at bedtime.  . traZODone (DESYREL) 100 MG tablet Take 200 mg by mouth at bedtime as needed for sleep.   . VESICARE 5 MG tablet Take 5 mg by mouth.  Marland Kitchen aspirin 81 MG chewable tablet Chew 1 tablet (81 mg total) by mouth daily. (Patient not taking: Reported on 03/23/2018)  . atorvastatin (LIPITOR) 10 MG tablet Take 1 tablet (10 mg total) by mouth  daily. (Patient not taking: Reported on 03/23/2018)   No current facility-administered medications on file prior to visit.      Allergies:   Other and Ace inhibitors   Social History   Socioeconomic History  . Marital status: Divorced    Spouse name: Not on file  . Number of children: Not on file  . Years of education: Not on file  . Highest education level: Not on file  Occupational History  . Not on file  Social Needs  . Financial resource strain: Not on file  . Food insecurity:    Worry: Not on file    Inability: Not on file  . Transportation needs:    Medical: Not on file    Non-medical: Not on file  Tobacco Use  . Smoking status: Never Smoker  . Smokeless tobacco: Never Used  Substance and Sexual Activity  . Alcohol use: No  . Drug use: No  . Sexual activity: Never  Lifestyle  . Physical activity:    Days per week: Not on file    Minutes per session: Not on file  . Stress: Not on file  Relationships  . Social connections:    Talks on phone: Not on file    Gets together: Not on file    Attends religious service: Not on file    Active member of club or organization: Not on file    Attends meetings of clubs or organizations: Not on file    Relationship status: Not on file  Other Topics Concern  . Not on file  Social History Narrative   Lives in [place] with her three sons.    Hobbies: reads and draw    On disability due to anxiety      Family History: The patient's family history includes COPD in her mother; Hyperlipidemia in her father; Hypertension in her father.  ROS:   Please see the history of present illness.  Additional pertinent ROS:  Constitutional: Negative for chills, fever, night sweats, unintentional weight loss  HENT: Negative for ear pain and hearing loss.   Eyes: Negative for loss of vision and eye pain.  Respiratory: Negative for cough, sputum, shortness of breath, wheezing.   Cardiovascular: Negative for chest pain, palpitations,  PND, orthopnea, lower extremity edema and claudication.  Gastrointestinal: Negative for abdominal pain, melena, and hematochezia.  Genitourinary: Negative for dysuria and hematuria.  Musculoskeletal: Negative for falls and myalgias.  Skin: Negative for itching and rash.  Neurological: Negative for focal weakness, focal sensory changes and loss of consciousness.  Endo/Heme/Allergies: Does not bruise/bleed easily.    EKGs/Labs/Other Studies Reviewed:    The following studies were personally reviewed today:  Echo 02/17/18 Study Conclusions  - Left ventricle: The cavity size was normal. Systolic function was   normal. The estimated ejection fraction was in the range of 50%   to 55%. Wall motion was normal; there were no regional wall  motion abnormalities. Doppler parameters are consistent with   abnormal left ventricular relaxation (grade 1 diastolic   dysfunction). Doppler parameters are consistent with high   ventricular filling pressure. - Ventricular septum: There was a defect in the perimembranous   region. - Aortic valve: Transvalvular velocity was within the normal range.   There was no stenosis. There was no regurgitation. - Mitral valve: Moderate prolapse, involving the anterior leaflet.   Transvalvular velocity was within the normal range. There was no   evidence for stenosis. There was no regurgitation. - Right ventricle: The cavity size was normal. Wall thickness was   normal. Systolic function was normal. - Atrial septum: No defect or patent foramen ovale was identified. - Tricuspid valve: There was mild regurgitation. - Pulmonic valve: The findings are consistent with moderate   stenosis. Peak gradient (S): 38 mm Hg. - Pulmonary arteries: Systolic pressure was within the normal   range.  Impressions:  - Compared with the echo 11/2017, the mean pulmonic valve gradient   has increased from 11 mmHg to 21 mmHg.  Echocardiogram 11/30/2017 Left ventricle: There is  evidence of perimembranous or supracristal VSD by colorflow Doppler with peak gradient of 55mmHg. The cavity size was mildly dilated. Systolic function was moderately reduced. The estimated ejection fraction was in the range of 35% to 40%. There is akinesis of the apical anterior, apical septal, lateral, inferolateral, inferior, and apical myocardium and suggestive of stress induced cardiomyopathy. Features are consistent with a pseudonormal left ventricular filling pattern, with concomitant abnormal relaxation and increased filling pressure (grade 2 diastolic dysfunction). Doppler parameters are consistent with high ventricular filling pressure. - Mitral valve: Calcified annulus. Mild, late systolicsystolic bowing without prolapse, involving the anterior leaflet. - Left atrium: The atrium was moderately dilated. - Atrial septum: There was increased thickness of the septum, consistent with lipomatous hypertrophy. - Tricuspid valve: There was trivial regurgitation. - Pulmonic valve: Pulmonic valve VTI 44.1 cm and peak velocity 235cm/sec with mean gradient of 58mmHg consistent with moderate pulmonic stenosis. The findings are consistent with moderate stenosis. - Pulmonary arteries: Systolic pressure could not be accurately estimated.  Impressions:  - Compared to prior echo, there is now akinesis of the apical segments of the LV suggestive of stress cardiomyopathy with EF 35-40% with grade 2 DD. There is a perimembranous or suprcristal VSD with a peak gradient of 58mmHg. Cannot accurately assess PASP. There is evidence of pulmonic stenosis but the mean gradient has decreased from 21mmHg to 26mmHg compared to priorLeft ventricle: There is evidence of perimembranous or supracristal VSD by colorflow Doppler with peak gradient of 75mmHg. The cavity size was mildly dilated. Systolic function was moderately reduced. The estimated ejection  fraction was in the range of 35% to 40%. There is akinesis of the apical anterior, apical septal, lateral, inferolateral, inferior, and apical myocardium and suggestive of stress induced cardiomyopathy. Features are consistent with a pseudonormal left ventricular filling pattern, with concomitant abnormal relaxation and increased filling pressure (grade 2 diastolic dysfunction). Doppler parameters are consistent with high ventricular filling pressure. - Mitral valve: Calcified annulus. Mild, late systolicsystolic bowing without prolapse, involving the anterior leaflet. - Left atrium: The atrium was moderately dilated. - Atrial septum: There was increased thickness of the septum, consistent with lipomatous hypertrophy. - Tricuspid valve: There was trivial regurgitation. - Pulmonic valve: Pulmonic valve VTI 44.1 cm and peak velocity 235cm/sec with mean gradient of 23mmHg consistent with moderate pulmonic stenosis. The findings are consistent with moderate stenosis. - Pulmonary arteries: Systolic pressure  could not be accurately estimated.  Impressions:  - Compared to prior echo, there is now akinesis of the apical segments of the LV suggestive of stress cardiomyopathy with EF 35-40% with grade 2 DD. There is a perimembranous or suprcristal VSD with a peak gradient of 71mmHg. Cannot accurately assess PASP. There is evidence of pulmonic stenosis but the mean gradient has decreased from 51mmHg to 81mmHg compared to prior   Cardiac Cath 12/01/2017 Conclusion     Hemodynamic findings consistent with moderate pulmonary hypertension.  Angiographically normal coronary arteries, somewhat tortuous  There is mild pulmonic valve stenosis.  LV end diastolic pressure is moderately elevated.  There is severe left ventricular systolic dysfunction.  The left ventricular ejection fraction is 25-35% by visual estimate.  There is trivial (1+)  mitral regurgitation.  There is no aortic valve stenosis.  There is mild mitral valve prolapse.   Angiographically normal coronary arteries.  Severely reduced LVEF of roughly 25 to 30% with global hypokinesis (worse in the anterior and apical walls) -consistent with Takotsubo/stress-induced cardiomyopathy  Mild mitral prolapse noted but no significant MR.  Pulmonary valve gradient of roughly 15 mmHg on pullback.  Cardiac output/index by Fick: 4.18, 2.52. Thermodilution 6.22, 3.74  No evidence of shunt by Qp/Qs=1; significant mixing of blood from IVC, coronary sinus and SVC (IVC sat 76%, SVC sat 66%, RA sat 64% -> RV sat 74%, PA sat 75%)     EKG:  EKG is personally reviewed.  The ekg ordered previously demonstrates SR, RBBB, LPFB  Recent Labs: 11/29/2017: BUN 14; Potassium 3.6; Sodium 139 11/30/2017: B Natriuretic Peptide 112.3 12/01/2017: Creatinine, Ser 0.57 12/02/2017: Hemoglobin 10.9; Platelets 233  Recent Lipid Panel    Component Value Date/Time   CHOL 169 11/30/2017 0332   TRIG 37 11/30/2017 0332   HDL 57 11/30/2017 0332   CHOLHDL 3.0 11/30/2017 0332   VLDL 7 11/30/2017 0332   LDLCALC 105 (H) 11/30/2017 0332    Physical Exam:    VS:  BP 138/76   Pulse 71   Ht 4\' 10"  (1.473 m)   Wt 152 lb 9.6 oz (69.2 kg)   BMI 31.89 kg/m     Wt Readings from Last 3 Encounters:  03/23/18 152 lb 9.6 oz (69.2 kg)  12/15/17 157 lb (71.2 kg)  12/01/17 160 lb 11.2 oz (72.9 kg)     GEN: Well nourished, well developed in no acute distress HEENT: Normal NECK: No JVD; No carotid bruits LYMPHATICS: No lymphadenopathy CARDIAC: regular rhythm, normal S1 and S2, no rubs, gallops. 3/6 holosystolic murmur best heard both right and left sternal border. Radial and DP pulses 2+ bilaterally. RESPIRATORY:  Clear to auscultation without rales, wheezing or rhonchi  ABDOMEN: Soft, non-tender, non-distended MUSCULOSKELETAL:  No edema; No deformity  SKIN: Warm and dry NEUROLOGIC:  Alert and  oriented x 3 PSYCHIATRIC:  Normal affect   ASSESSMENT:    1. Takotsubo cardiomyopathy   2. Paramembranous VSD   3. Pulmonic stenosis, congenital   4. Mitral valve prolapse determined by imaging    PLAN:    1. Takotsubo cardiomyopathy: resolved, EF improved. Continue beta blocker for now.  2. VSD, lmitral valve prolapse, mild pulmonic stenosis: no intervention required -VSD: no significant shunt, no evidence of volume overload on echo -mitral valve prolapse: no significant regurgitation -pulmonic stenosis: 15 mmHg on pullback, between 11 and 21 mmHg mean on echo.   3. Prevention counseling: technically secondary prevention with NSTEMI, but normal cors -recommend heart healthy/Mediterranean diet, with whole grains,  fruits, vegetable, fish, lean meats, nuts, and olive oil. Limit salt. -recommend moderate walking, 3-5 times/week for 30-50 minutes each session. Aim for at least 150 minutes.week. Goal should be pace of 3 miles/hours, or walking 1.5 miles in 30 minutes -recommend avoidance of tobacco products. Avoid excess alcohol. -Additional risk factor control:  -Diabetes: A1c is 6.4. Consider SGLT2 inhibitor in the future to prevent CV events.  -Lipids: last LDL 105. No CAD angiographically, but does have history of TIA. Ideal goal LDL <70. See below re: statin  -Blood pressure control: Hypertension goal <130/80. Working on lifestyle for improvement. Only on metoprolol given takotsubo's. Would consider adding ARB given her diabetes (cough on ACEI).  -Weight: BMI 31.9. Working on lifestyle changes. Patient is not taking her aspirin or atorvastatin. Given her normal coronaries, she does not need aspirin from a cardiac perspective, but with history of TIA it would be recommended. However, with prior epistaxis risk/benefit can be discussed further From the statin perspective, she does not have CAD, but she does have significant risk factors. She did not like how she felt on atorvastatin. Though  lower intensity, could consider pravastatin as it is generally well tolerated.  She wants to talk about medication changes with her PCP. We will also discuss again at our next visit.  Plan for follow up: 6 mos  Medication Adjustments/Labs and Tests Ordered: Current medicines are reviewed at length with the patient today.  Concerns regarding medicines are outlined above.  No orders of the defined types were placed in this encounter.  No orders of the defined types were placed in this encounter.   Patient Instructions  Medication Instructions:  Your Physician recommend you continue on your current medication as directed.    If you need a refill on your cardiac medications before your next appointment, please call your pharmacy.   Lab work: None   Testing/Procedures: None  Follow-Up: At [facility], you and your health needs are our priority.  As part of our continuing mission to provide you with exceptional heart care, we have created designated Provider Care Teams.  These Care Teams include your primary Cardiologist (physician) and Advanced Practice Providers (APPs -  Physician Assistants and Nurse Practitioners) who all work together to provide you with the care you need, when you need it. You will need a follow up appointment in 6 months.  Please call our office 2 months in advance to schedule this appointment.  You may see Dr. Harrell Gave or one of the following Advanced Practice Providers on your designated Care Team:   Rosaria Ferries, PA-C . Jory Sims, DNP, ANP  Any Other Special Instructions Will Be Listed Below (If Applicable).       Signed, Buford Dresser, MD PhD 03/23/2018 5:12 PM    [place]

## 2018-09-09 ENCOUNTER — Telehealth (INDEPENDENT_AMBULATORY_CARE_PROVIDER_SITE_OTHER): Payer: Medicare Other | Admitting: Cardiology

## 2018-09-09 DIAGNOSIS — I5181 Takotsubo syndrome: Secondary | ICD-10-CM | POA: Diagnosis not present

## 2018-09-09 DIAGNOSIS — Q210 Ventricular septal defect: Secondary | ICD-10-CM | POA: Diagnosis not present

## 2018-09-09 DIAGNOSIS — I341 Nonrheumatic mitral (valve) prolapse: Secondary | ICD-10-CM

## 2018-09-09 DIAGNOSIS — Z7189 Other specified counseling: Secondary | ICD-10-CM

## 2018-09-09 DIAGNOSIS — Z8673 Personal history of transient ischemic attack (TIA), and cerebral infarction without residual deficits: Secondary | ICD-10-CM

## 2018-09-09 NOTE — Patient Instructions (Signed)

## 2018-09-09 NOTE — Progress Notes (Signed)
Virtual Visit via Telephone Note   This visit type was conducted due to national recommendations for restrictions regarding the COVID-19 Pandemic (e.g. social distancing) in an effort to limit this patient's exposure and mitigate transmission in our community.  Due to her co-morbid illnesses, this patient is at least at moderate risk for complications without adequate follow up.  This format is felt to be most appropriate for this patient at this time.  The patient did not have access to video technology/had technical difficulties with video requiring transitioning to audio format only (telephone).  All issues noted in this document were discussed and addressed.  No physical exam could be performed with this format.  Please refer to the patient's chart for her  consent to telehealth for [facility].   Date:  09/09/2018   ID:  Stephanie Bray, DOB [birth year], MRN [id number]  Patient Location: Home Provider Location: Home  PCP:  [facility]  Cardiologist:  Buford Dresser, MD  Electrophysiologist:  None   Evaluation Performed:  Follow-Up Visit  Chief Complaint:  Follow up  History of Present Illness:    Stephanie Bray is a 58 y.o. female with hx of hypertension, hyperlipidemia, diabetes, TIA, VSD, takotsubo cardiomyopathy  The patient does not have symptoms concerning for COVID-19 infection (fever, chills, cough, or new shortness of breath).   She is doing very well. Staying active despite social distancing, doing zumba at home. Denies chest pain, shortness of breath at rest or with normal exertion. No PND, orthopnea, LE edema or unexpected weight gain. No syncope or palpitations. No issues with medications. Working hard on lifestyle changes, losing weight intentionally.  Past Medical History:  Diagnosis Date  . Anxiety   . Arthritis    "left hip" (04/25/2014)  . Chest pain    a. 04/2014 MV: small anteroapical perfusion defect - likely breast  attenuation->low risk.  . Chronic bronchitis ([place])    "get it mostly q yr" (04/25/2014)  . Depression   . Essential hypertension   . GERD (gastroesophageal reflux disease)   . Heart murmur   . High cholesterol   . Iron deficiency anemia   . Migraine    "weekly, at least" (04/25/2014)  . VSD (ventricular septal defect)    a. 04/2014 Echo: EF 55-60%, PASP 31mmHg, small restrictive either supracristal or perimembranous VSD w/o PAH.   Past Surgical History:  Procedure Laterality Date  . CHOLECYSTECTOMY    . RIGHT/LEFT HEART CATH AND CORONARY ANGIOGRAPHY N/A 12/01/2017   Procedure: RIGHT/LEFT HEART CATH AND CORONARY ANGIOGRAPHY;  Surgeon: Leonie Man, MD;  Location: [facility];  Service: Cardiovascular;  Laterality: N/A;  . VSD REPAIR  ~ 1963   [facility]/notes 04/25/2014     Current Meds  Medication Sig  . busPIRone (BUSPAR) 10 MG tablet Take 10 mg by mouth 3 (three) times daily.  . clotrimazole-betamethasone (LOTRISONE) cream Apply 1 application topically 2 (two) times daily. TO AFFECTED AREA-GROIN  . Ferrous Sulfate (IRON) 325 (65 Fe) MG TABS Take 325 mg by mouth every other day.   . metFORMIN (GLUCOPHAGE-XR) 500 MG 24 hr tablet Take 500 mg by mouth daily with breakfast.   . metoprolol succinate (TOPROL-XL) 25 MG 24 hr tablet Take 1 tablet (25 mg total) by mouth daily.  . nitroGLYCERIN (NITROSTAT) 0.4 MG SL tablet Place 1 tablet (0.4 mg total) under the tongue every 5 (five) minutes as needed for chest pain.  . Omega-3 Fatty Acids (FISH OIL) 1000  MG CAPS Take 1,000 mg by mouth daily.  Marland Kitchen omeprazole (PRILOSEC) 20 MG capsule Take 20 mg by mouth daily.   . simvastatin (ZOCOR) 20 MG tablet Take 20 mg by mouth at bedtime.  . traZODone (DESYREL) 100 MG tablet Take 300 mg by mouth at bedtime as needed for sleep.   . VESICARE 5 MG tablet Take 5 mg by mouth.     Allergies:   Flonase [fluticasone propionate]; Other; and Ace inhibitors   Social History   Tobacco Use  . Smoking  status: Never Smoker  . Smokeless tobacco: Never Used  Substance Use Topics  . Alcohol use: No  . Drug use: No     Family Hx: The patient's family history includes COPD in her mother; Hyperlipidemia in her father; Hypertension in her father.  ROS:   Please see the history of present illness.    Constitutional: Negative for chills, fever, night sweats, unintentional weight loss  HENT: Negative for ear pain and hearing loss.   Eyes: Negative for loss of vision and eye pain.  Respiratory: Negative for cough, sputum, wheezing.   Cardiovascular: See HPI. Gastrointestinal: Negative for abdominal pain, melena, and hematochezia.  Genitourinary: Negative for dysuria and hematuria.  Musculoskeletal: Negative for falls and myalgias.  Skin: Negative for itching and rash.  Neurological: Negative for focal weakness, focal sensory changes and loss of consciousness.  Endo/Heme/Allergies: Does not bruise/bleed easily.  All other systems reviewed and are negative.   Prior CV studies:   The following studies were reviewed today: Prior cath, echo  Labs/Other Tests and Data Reviewed:    EKG:  An ECG dated 11/30/17 was personally reviewed today and demonstrated:  SR, 1st degree AV block, RBBB, LPFB, nonspecific ST changes  Recent Labs: 11/29/2017: BUN 14; Potassium 3.6; Sodium 139 11/30/2017: B Natriuretic Peptide 112.3 12/01/2017: Creatinine, Ser 0.57 12/02/2017: Hemoglobin 10.9; Platelets 233   Recent Lipid Panel Lab Results  Component Value Date/Time   CHOL 169 11/30/2017 03:32 AM   TRIG 37 11/30/2017 03:32 AM   HDL 57 11/30/2017 03:32 AM   CHOLHDL 3.0 11/30/2017 03:32 AM   LDLCALC 105 (H) 11/30/2017 03:32 AM    Wt Readings from Last 3 Encounters:  03/23/18 152 lb 9.6 oz (69.2 kg)  12/15/17 157 lb (71.2 kg)  12/01/17 160 lb 11.2 oz (72.9 kg)     Objective:    Vital Signs:  There were no vitals taken for this visit.   Comfortable over the phone, speaking easily, in no acute  distress  ASSESSMENT & PLAN:    Takotsubo cardiomyopathy: resolved, EF improved. No symptoms of heart failure. Able to exercise, be active. -Continue beta blocker. There is little guidance about the duration of this, but given the severity of her presentation, would be reasonable to continue as long as she tolerates it.  VSD, lmitral valve prolapse, mild pulmonic stenosis: no intervention required -VSD: no significant shunt, no evidence of volume overload on echo -mitral valve prolapse: no significant regurgitation -pulmonic stenosis: 15 mmHg on pullback, between 11 and 21 mmHg mean on echo.   Prevention counseling: technically secondary prevention with NSTEMI, but normal cors -recommend heart healthy/Mediterranean diet, with whole grains, fruits, vegetable, fish, lean meats, nuts, and olive oil. Limit salt. -recommend moderate walking, 3-5 times/week for 30-50 minutes each session. Aim for at least 150 minutes.week. Goal should be pace of 3 miles/hours, or walking 1.5 miles in 30 minutes -recommend avoidance of tobacco products. Avoid excess alcohol. -Additional risk factor control:             -  Diabetes: A1c is 6.4. Consider SGLT2 inhibitor in the future to prevent CV events.             -Lipids: last LDL 105. No CAD angiographically, but does have history of TIA. Ideal goal LDL <70. Tolerating simvastatin 20 mg.             -Blood pressure control: Hypertension goal <130/80. Working on lifestyle for improvement. Only on metoprolol given takotsubo's. Would consider adding ARB given her diabetes (cough on ACEI). No available BP readings today.  -history of TIA: have discussed aspirin, statin recommendations before, see prior notes.  COVID-19 Education: The signs and symptoms of COVID-19 were discussed with the patient and how to seek care for testing (follow up with PCP or arrange E-visit).  The importance of social distancing was discussed today.  Time:   Today, I have spent 16 minutes  with the patient with telehealth technology discussing the above problems.    Patient Instructions  Medication Instructions:  Your Physician recommend you continue on your current medication as directed.    If you need a refill on your cardiac medications before your next appointment, please call your pharmacy.   Lab work: None  Testing/Procedures: None  Follow-Up: At [facility], you and your health needs are our priority.  As part of our continuing mission to provide you with exceptional heart care, we have created designated Provider Care Teams.  These Care Teams include your primary Cardiologist (physician) and Advanced Practice Providers (APPs -  Physician Assistants and Nurse Practitioners) who all work together to provide you with the care you need, when you need it. You will need a follow up appointment in 6 months.  Please call our office 2 months in advance to schedule this appointment.  You may see Buford Dresser, MD or one of the following Advanced Practice Providers on your designated Care Team:   Rosaria Ferries, PA-C . Jory Sims, DNP, ANP      Medication Adjustments/Labs and Tests Ordered: Current medicines are reviewed at length with the patient today.  Concerns regarding medicines are outlined above.   Tests Ordered: No orders of the defined types were placed in this encounter.   Medication Changes: No orders of the defined types were placed in this encounter.   Disposition:  Follow up 6 mos. Recheck lipids at that time.  Signed, Buford Dresser, MD  09/09/2018 3:53 PM    [place]

## 2018-11-30 ENCOUNTER — Other Ambulatory Visit: Payer: Self-pay | Admitting: Adult Health

## 2019-03-31 ENCOUNTER — Ambulatory Visit (INDEPENDENT_AMBULATORY_CARE_PROVIDER_SITE_OTHER): Payer: Medicare Other | Admitting: Cardiology

## 2019-03-31 ENCOUNTER — Other Ambulatory Visit: Payer: Self-pay

## 2019-03-31 ENCOUNTER — Encounter: Payer: Self-pay | Admitting: Cardiology

## 2019-03-31 VITALS — BP 149/74 | HR 81 | Ht <= 58 in | Wt 151.0 lb

## 2019-03-31 DIAGNOSIS — Q221 Congenital pulmonary valve stenosis: Secondary | ICD-10-CM

## 2019-03-31 DIAGNOSIS — Q210 Ventricular septal defect: Secondary | ICD-10-CM

## 2019-03-31 DIAGNOSIS — I341 Nonrheumatic mitral (valve) prolapse: Secondary | ICD-10-CM

## 2019-03-31 DIAGNOSIS — I10 Essential (primary) hypertension: Secondary | ICD-10-CM | POA: Diagnosis not present

## 2019-03-31 DIAGNOSIS — I5181 Takotsubo syndrome: Secondary | ICD-10-CM | POA: Diagnosis not present

## 2019-03-31 DIAGNOSIS — R9431 Abnormal electrocardiogram [ECG] [EKG]: Secondary | ICD-10-CM

## 2019-03-31 DIAGNOSIS — Z8673 Personal history of transient ischemic attack (TIA), and cerebral infarction without residual deficits: Secondary | ICD-10-CM

## 2019-03-31 NOTE — Patient Instructions (Signed)

## 2019-03-31 NOTE — Progress Notes (Signed)
Cardiology Office Note:    Date:  03/31/2019   ID:  Stephanie Bray, DOB [birth year], MRN [id number]  PCP:  [facility]  Cardiologist:  Buford Dresser, MD PhD  Referring MD: [facility]*   CC: follow up  History of Present Illness:    Stephanie Bray is a 58 y.o. female with a hx of hypertension, hyperlipidemia, diabetes, TIA, VSD who is seen in follow up for the evaluation and management of takotsubo cardiomyopathy.   Cardiac history: admitted 11/2018 with chest pain/epistaxis. Had [facility] with normal (though tortuous) coronaries. Had moderate PH and severe LV dysfunction consistent with takotsubo cardiomyopathy. Her pulmonary valve gradient on pullback during RHC was 15 mmHg. No significant shunt by Qp/Qs.   She had a repeat echo 3 mos later, which showed improvement of her LVEF to 50-55%. She again had a perimembranous VSD noted. She was noted to have moderate anterior leaflet mitral valve prolapse without significant regurgitation. RV size and function was normal. Pulmonic valve gradient noted to be elevated from 11 to 21 mmHg compared to prior.  Today: Doing well[place]. Has some back aches from packing/moving, no chest pain, shortness of breath, LE edema. Has lost some weight with changing diet and being active.   We reviewed her history, recommended medications today. All questions answered.   Past Medical History:  Diagnosis Date  . Anxiety   . Arthritis    "left hip" (04/25/2014)  . Chest pain    a. 04/2014 MV: small anteroapical perfusion defect - likely breast attenuation->low risk.  . Chronic bronchitis ([place])    "get it mostly q yr" (04/25/2014)  . Depression   . Essential hypertension   . GERD (gastroesophageal reflux disease)   . Heart murmur   . High cholesterol   . Iron deficiency anemia   . Migraine    "weekly, at least" (04/25/2014)  . VSD (ventricular septal defect)     a. 04/2014 Echo: EF 55-60%, PASP 21mmHg, small restrictive either supracristal or perimembranous VSD w/o PAH.    Past Surgical History:  Procedure Laterality Date  . CHOLECYSTECTOMY    . RIGHT/LEFT HEART CATH AND CORONARY ANGIOGRAPHY N/A 12/01/2017   Procedure: RIGHT/LEFT HEART CATH AND CORONARY ANGIOGRAPHY;  Surgeon: Leonie Man, MD;  Location: [place] [facility];  Service: Cardiovascular;  Laterality: N/A;  . VSD REPAIR  ~ 1963   [facility]/notes 04/25/2014    Current Medications: Current Outpatient Medications on File Prior to Visit  Medication Sig  . busPIRone (BUSPAR) 15 MG tablet   . cetirizine (ZYRTEC) 10 MG tablet Take 10 mg by mouth daily.  . cholecalciferol (VITAMIN D3) 25 MCG (1000 UT) tablet Take 1,000 Units by mouth daily.  . clotrimazole-betamethasone (LOTRISONE) cream Apply 1 application topically 2 (two) times daily. TO AFFECTED AREA-GROIN  . Ferrous Sulfate (IRON) 325 (65 Fe) MG TABS Take 325 mg by mouth every other day.   . metFORMIN (GLUCOPHAGE-XR) 500 MG 24 hr tablet Take 500 mg by mouth daily with breakfast.   . metoprolol succinate (TOPROL-XL) 25 MG 24 hr tablet Take 1 tablet (25 mg total) by mouth daily.  . nitroGLYCERIN (NITROSTAT) 0.4 MG SL tablet Place 1 tablet (0.4 mg total) under the tongue every 5 (five) minutes as needed for chest pain.  . Omega-3 Fatty Acids (FISH OIL) 1000 MG CAPS Take 1,000 mg by mouth daily.  Marland Kitchen omeprazole (PRILOSEC) 20 MG capsule Take 20 mg  by mouth daily.   . simvastatin (ZOCOR) 20 MG tablet Take 20 mg by mouth at bedtime.  . traZODone (DESYREL) 100 MG tablet Take 300 mg by mouth at bedtime as needed for sleep.   . VESICARE 5 MG tablet Take 5 mg by mouth.   No current facility-administered medications on file prior to visit.      Allergies:   Flonase [fluticasone propionate], Other, and Ace inhibitors   Social History   Tobacco Use  . Smoking status: Never Smoker  . Smokeless tobacco: Never Used  Substance Use Topics  . Alcohol  use: No  . Drug use: No    Family History: The patient's family history includes COPD in her mother; Hyperlipidemia in her father; Hypertension in her father.  ROS:   Please see the history of present illness.  Additional pertinent ROS: Constitutional: Negative for chills, fever, night sweats, unintentional weight loss  HENT: Negative for ear pain and hearing loss.   Eyes: Negative for loss of vision and eye pain.  Respiratory: Negative for cough, sputum, wheezing.   Cardiovascular: See HPI. Gastrointestinal: Negative for abdominal pain, melena, and hematochezia.  Genitourinary: Negative for dysuria and hematuria.  Musculoskeletal: Negative for falls and myalgias.  Skin: Negative for itching and rash.  Neurological: Negative for focal weakness, focal sensory changes and loss of consciousness.  Endo/Heme/Allergies: Does not bruise/bleed easily.   EKGs/Labs/Other Studies Reviewed:    The following studies were personally reviewed today:  Echo 02/17/18 Study Conclusions  - Left ventricle: The cavity size was normal. Systolic function was   normal. The estimated ejection fraction was in the range of 50%   to 55%. Wall motion was normal; there were no regional wall   motion abnormalities. Doppler parameters are consistent with   abnormal left ventricular relaxation (grade 1 diastolic   dysfunction). Doppler parameters are consistent with high   ventricular filling pressure. - Ventricular septum: There was a defect in the perimembranous   region. - Aortic valve: Transvalvular velocity was within the normal range.   There was no stenosis. There was no regurgitation. - Mitral valve: Moderate prolapse, involving the anterior leaflet.   Transvalvular velocity was within the normal range. There was no   evidence for stenosis. There was no regurgitation. - Right ventricle: The cavity size was normal. Wall thickness was   normal. Systolic function was normal. - Atrial septum: No  defect or patent foramen ovale was identified. - Tricuspid valve: There was mild regurgitation. - Pulmonic valve: The findings are consistent with moderate   stenosis. Peak gradient (S): 38 mm Hg. - Pulmonary arteries: Systolic pressure was within the normal   range.  Impressions:  - Compared with the echo 11/2017, the mean pulmonic valve gradient   has increased from 11 mmHg to 21 mmHg.  Echocardiogram 11/30/2017 Left ventricle: There is evidence of perimembranous or supracristal VSD by colorflow Doppler with peak gradient of 73mmHg. The cavity size was mildly dilated. Systolic function was moderately reduced. The estimated ejection fraction was in the range of 35% to 40%. There is akinesis of the apical anterior, apical septal, lateral, inferolateral, inferior, and apical myocardium and suggestive of stress induced cardiomyopathy. Features are consistent with a pseudonormal left ventricular filling pattern, with concomitant abnormal relaxation and increased filling pressure (grade 2 diastolic dysfunction). Doppler parameters are consistent with high ventricular filling pressure. - Mitral valve: Calcified annulus. Mild, late systolicsystolic bowing without prolapse, involving the anterior leaflet. - Left atrium: The atrium was moderately dilated. -  Atrial septum: There was increased thickness of the septum, consistent with lipomatous hypertrophy. - Tricuspid valve: There was trivial regurgitation. - Pulmonic valve: Pulmonic valve VTI 44.1 cm and peak velocity 235cm/sec with mean gradient of 21mmHg consistent with moderate pulmonic stenosis. The findings are consistent with moderate stenosis. - Pulmonary arteries: Systolic pressure could not be accurately estimated.  Impressions:  - Compared to prior echo, there is now akinesis of the apical segments of the LV suggestive of stress cardiomyopathy with EF 35-40% with grade 2 DD. There is  a perimembranous or suprcristal VSD with a peak gradient of 47mmHg. Cannot accurately assess PASP. There is evidence of pulmonic stenosis but the mean gradient has decreased from 32mmHg to 66mmHg compared to priorLeft ventricle: There is evidence of perimembranous or supracristal VSD by colorflow Doppler with peak gradient of 70mmHg. The cavity size was mildly dilated. Systolic function was moderately reduced. The estimated ejection fraction was in the range of 35% to 40%. There is akinesis of the apical anterior, apical septal, lateral, inferolateral, inferior, and apical myocardium and suggestive of stress induced cardiomyopathy. Features are consistent with a pseudonormal left ventricular filling pattern, with concomitant abnormal relaxation and increased filling pressure (grade 2 diastolic dysfunction). Doppler parameters are consistent with high ventricular filling pressure. - Mitral valve: Calcified annulus. Mild, late systolicsystolic bowing without prolapse, involving the anterior leaflet. - Left atrium: The atrium was moderately dilated. - Atrial septum: There was increased thickness of the septum, consistent with lipomatous hypertrophy. - Tricuspid valve: There was trivial regurgitation. - Pulmonic valve: Pulmonic valve VTI 44.1 cm and peak velocity 235cm/sec with mean gradient of 15mmHg consistent with moderate pulmonic stenosis. The findings are consistent with moderate stenosis. - Pulmonary arteries: Systolic pressure could not be accurately estimated.  Impressions:  - Compared to prior echo, there is now akinesis of the apical segments of the LV suggestive of stress cardiomyopathy with EF 35-40% with grade 2 DD. There is a perimembranous or suprcristal VSD with a peak gradient of 67mmHg. Cannot accurately assess PASP. There is evidence of pulmonic stenosis but the mean gradient has decreased from 10mmHg to 76mmHg  compared to prior   Cardiac Cath 12/01/2017 Conclusion     Hemodynamic findings consistent with moderate pulmonary hypertension.  Angiographically normal coronary arteries, somewhat tortuous  There is mild pulmonic valve stenosis.  LV end diastolic pressure is moderately elevated.  There is severe left ventricular systolic dysfunction.  The left ventricular ejection fraction is 25-35% by visual estimate.  There is trivial (1+) mitral regurgitation.  There is no aortic valve stenosis.  There is mild mitral valve prolapse.   Angiographically normal coronary arteries.  Severely reduced LVEF of roughly 25 to 30% with global hypokinesis (worse in the anterior and apical walls) -consistent with Takotsubo/stress-induced cardiomyopathy  Mild mitral prolapse noted but no significant MR.  Pulmonary valve gradient of roughly 15 mmHg on pullback.  Cardiac output/index by Fick: 4.18, 2.52. Thermodilution 6.22, 3.74  No evidence of shunt by Qp/Qs=1; significant mixing of blood from IVC, coronary sinus and SVC (IVC sat 76%, SVC sat 66%, RA sat 64% -> RV sat 74%, PA sat 75%)     EKG:  EKG is personally reviewed.  The ekg ordered today demonstrates SR, RBBB, LPFB, st degree AV block  Recent Labs: No results found for requested labs within last 8760 hours.  Recent Lipid Panel    Component Value Date/Time   CHOL 169 11/30/2017 0332   TRIG 37 11/30/2017 0332   HDL 57  11/30/2017 0332   CHOLHDL 3.0 11/30/2017 0332   VLDL 7 11/30/2017 0332   LDLCALC 105 (H) 11/30/2017 0332    Physical Exam:    VS:  BP (!) 149/74   Pulse 81   Ht 4\' 10"  (1.473 m)   Wt 151 lb (68.5 kg)   SpO2 98%   BMI 31.56 kg/m     Wt Readings from Last 3 Encounters:  03/31/19 151 lb (68.5 kg)  03/23/18 152 lb 9.6 oz (69.2 kg)  12/15/17 157 lb (71.2 kg)    GEN: Well nourished, well developed in no acute distress HEENT: Normal, moist mucous membranes NECK: No JVD CARDIAC: regular rhythm, normal  S1 and S2, no rubs or gallops. 3/6 HSM heard bilateral sternum VASCULAR: Radial and DP pulses 2+ bilaterally. No carotid bruits RESPIRATORY:  Clear to auscultation without rales, wheezing or rhonchi  ABDOMEN: Soft, non-tender, non-distended MUSCULOSKELETAL:  Ambulates independently SKIN: Warm and dry, no edema NEUROLOGIC:  Alert and oriented x 3. No focal neuro deficits noted. PSYCHIATRIC:  Normal affect   ASSESSMENT:    1. Essential hypertension   2. Paramembranous VSD   3. Takotsubo cardiomyopathy   4. Pulmonic stenosis, congenital   5. History of TIA (transient ischemic attack)   6. Mitral valve prolapse determined by imaging   7. Abnormal ECG    PLAN:    Takotsubo cardiomyopathy: resolved, EF improved -continue beta blocker; given the severity of her presentation, would continue indefinitely as long as she tolerates  VSD, mitral valve prolapse, mild pulmonic stenosis: no intervention required -VSD: no significant shunt, no evidence of volume overload on echo -mitral valve prolapse: no significant regurgitation -pulmonic stenosis: 15 mmHg on pullback, between 11 and 21 mmHg mean on echo.   Hypertension: slightly elevated today. Has been moving, thinks this is why. Recheck similar. Recommend monitoring, will contact me if it remains elevated once she has settle in after her move.  Abnormal ECG: RBBB, LPFB, first degree AV block. No high risk symptoms, monitor.  Prevention counseling: Secondary prevention given TIA history, NSTEMI (though normal cors) -recommend heart healthy/Mediterranean diet, with whole grains, fruits, vegetable, fish, lean meats, nuts, and olive oil. Limit salt. -recommend moderate walking, 3-5 times/week for 30-50 minutes each session. Aim for at least 150 minutes.week. Goal should be pace of 3 miles/hours, or walking 1.5 miles in 30 minutes -recommend avoidance of tobacco products. Avoid excess alcohol. -Additional risk factor control:  -Diabetes: A1c is  6.4. Consider SGLT2 inhibitor in the future to prevent CV events.  -Lipids: last LDL 105. No CAD angiographically, but does have history of TIA. Ideal goal LDL <70. See below re: statin  -Blood pressure control: Hypertension goal <130/80. Working on lifestyle for improvement. Only on metoprolol given takotsubo's. Would consider adding ARB given her diabetes (cough on ACEI).  -Weight: BMI 31.9. Working on lifestyle changes.  We again discussed aspirin and statin. She chooses to not add these medications at this time, similar to our prior discussion.   Plan for follow up: 6 mos  Medication Adjustments/Labs and Tests Ordered: Current medicines are reviewed at length with the patient today.  Concerns regarding medicines are outlined above.  Orders Placed This Encounter  Procedures  . EKG 12-Lead   No orders of the defined types were placed in this encounter.   Patient Instructions  Medication Instructions:  Your Physician recommend you continue on your current medication as directed.    *If you need a refill on your cardiac medications before your next  appointment, please call your pharmacy*  Lab Work: None  Testing/Procedures: None  Follow-Up: At [facility], you and your health needs are our priority.  As part of our continuing mission to provide you with exceptional heart care, we have created designated Provider Care Teams.  These Care Teams include your primary Cardiologist (physician) and Advanced Practice Providers (APPs -  Physician Assistants and Nurse Practitioners) who all work together to provide you with the care you need, when you need it.  Your next appointment:   6 month(s)  The format for your next appointment:   In Person  Provider:   Buford Dresser, MD      Signed, Buford Dresser, MD PhD 03/31/2019 7:06 PM    [place]

## 2019-11-23 ENCOUNTER — Ambulatory Visit: Payer: Medicare Other | Admitting: Cardiology

## 2019-12-13 ENCOUNTER — Emergency Department (HOSPITAL_COMMUNITY): Payer: Medicare Other

## 2019-12-13 ENCOUNTER — Emergency Department (HOSPITAL_COMMUNITY)
Admission: EM | Admit: 2019-12-13 | Discharge: 2019-12-14 | Disposition: A | Discharge status: Home / Self Care | Payer: Medicare Other | Attending: Emergency Medicine | Admitting: Emergency Medicine

## 2019-12-13 DIAGNOSIS — I10 Essential (primary) hypertension: Secondary | ICD-10-CM | POA: Diagnosis not present

## 2019-12-13 DIAGNOSIS — Z8673 Personal history of transient ischemic attack (TIA), and cerebral infarction without residual deficits: Secondary | ICD-10-CM | POA: Diagnosis not present

## 2019-12-13 DIAGNOSIS — R072 Precordial pain: Secondary | ICD-10-CM | POA: Insufficient documentation

## 2019-12-13 DIAGNOSIS — Z79899 Other long term (current) drug therapy: Secondary | ICD-10-CM | POA: Diagnosis not present

## 2019-12-13 DIAGNOSIS — Z7984 Long term (current) use of oral hypoglycemic drugs: Secondary | ICD-10-CM | POA: Insufficient documentation

## 2019-12-13 LAB — HEPATIC FUNCTION PANEL
ALT: 13 U/L (ref 0–44)
AST: 25 U/L (ref 15–41)
Albumin: 4 g/dL (ref 3.5–5.0)
Alkaline Phosphatase: 56 U/L (ref 38–126)
Bilirubin, Direct: <0.1 mg/dL (ref 0.0–0.2)
Total Bilirubin: 0.6 mg/dL (ref 0.3–1.2)
Total Protein: 6.7 g/dL (ref 6.5–8.1)

## 2019-12-13 LAB — TROPONIN I (HIGH SENSITIVITY)
Troponin I (High Sensitivity): 13 ng/L (ref <18)
Troponin I (High Sensitivity): 21 ng/L — ABNORMAL HIGH (ref <18)

## 2019-12-13 LAB — CBC
HCT: 36.6 % (ref 36.0–46.0)
Hemoglobin: 11 g/dL — ABNORMAL LOW (ref 12.0–15.0)
MCH: 22.9 pg — ABNORMAL LOW (ref 26.0–34.0)
MCHC: 30.1 g/dL (ref 30.0–36.0)
MCV: 76.3 fL — ABNORMAL LOW (ref 80.0–100.0)
Platelets: 298 10*3/uL (ref 150–400)
RBC: 4.8 MIL/uL (ref 3.87–5.11)
RDW: 16.2 % — ABNORMAL HIGH (ref 11.5–15.5)
WBC: 7.4 10*3/uL (ref 4.0–10.5)
nRBC: 0 % (ref 0.0–0.2)

## 2019-12-13 LAB — BASIC METABOLIC PANEL
Anion gap: 10 (ref 5–15)
BUN: 14 mg/dL (ref 6–20)
CO2: 26 mmol/L (ref 22–32)
Calcium: 9.5 mg/dL (ref 8.9–10.3)
Chloride: 104 mmol/L (ref 98–111)
Creatinine, Ser: 0.51 mg/dL (ref 0.44–1.00)
GFR calc Af Amer: >60 mL/min (ref >60)
GFR calc non Af Amer: >60 mL/min (ref >60)
Glucose, Bld: 133 mg/dL — ABNORMAL HIGH (ref 70–99)
Potassium: 3.9 mmol/L (ref 3.5–5.1)
Sodium: 140 mmol/L (ref 135–145)

## 2019-12-13 LAB — CBG MONITORING, ED: Glucose-Capillary: 124 mg/dL — ABNORMAL HIGH (ref 70–99)

## 2019-12-13 LAB — LIPASE, BLOOD: Lipase: 26 U/L (ref 11–51)

## 2019-12-13 MED ORDER — NITROGLYCERIN 0.4 MG SL SUBL: 0.4000 mg | SUBLINGUAL_TABLET | SUBLINGUAL | Status: DC | PRN

## 2019-12-13 MED ORDER — SODIUM CHLORIDE 0.9% FLUSH: 3.0000 mL | Freq: Once | INTRAVENOUS | Status: DC

## 2019-12-13 MED ORDER — METOPROLOL TARTRATE 5 MG/5ML IV SOLN
5.0000 mg | Freq: Once | INTRAVENOUS | Status: AC
  Administered 2019-12-13: 5 mg via INTRAVENOUS
  Filled 2019-12-13: qty 5

## 2019-12-13 MED ORDER — IOHEXOL 350 MG/ML SOLN
100.0000 mL | Freq: Once | INTRAVENOUS | Status: AC | PRN
  Administered 2019-12-13: 100 mL via INTRAVENOUS

## 2019-12-13 MED ORDER — PANTOPRAZOLE SODIUM 40 MG IV SOLR
40.0000 mg | Freq: Once | INTRAVENOUS | Status: AC
  Administered 2019-12-13: 40 mg via INTRAVENOUS
  Filled 2019-12-13: qty 40

## 2019-12-13 MED ORDER — METOPROLOL SUCCINATE ER 25 MG PO TB24
25.0000 mg | ORAL_TABLET | Freq: Every day | ORAL | Status: DC
  Administered 2019-12-13: 25 mg via ORAL
  Filled 2019-12-13: qty 1

## 2019-12-13 NOTE — ED Triage Notes (Signed)
Pt arrived from home via [facility] from home after pt has had CP x1 day. Pt states pain is a 7/10.

## 2019-12-13 NOTE — ED Provider Notes (Signed)
[facility] EMERGENCY DEPARTMENT Provider Note   CSN: [id number] Arrival date & time: 12/13/19  1553     History Chief Complaint  Patient presents with  . Chest Pain    Stephanie Bray is a 59 y.o. female.  HPI Patient reports he started getting chest pain yesterday afternoon or later evening.  It was a pressure-like sensation or heaviness in her left and anterior chest.  Patient reports it was waxing and waning.  She reports today it however has been more persistent and lasting longer.  She denies she feels short of breath.  She denies increased dyspnea on exertion.  No nausea or vomiting.  No cough fever or chills.  No lower extremity swelling or calf pain.  Patient reports she tried her acid suppressant medication without relief.  She called EMS today when the pain had gotten worse.  She reports they gave her aspirin and 1 nitroglycerin.  She reports nitroglycerin improved the pain from an 8 out of 10 to a 6 out of 10.  Patient reports that her son checked her blood pressure at home on his machine prior to calling EMS, she reports that reading showed a systolic of greater than 606.  Patient does not recall what the EMS blood pressure reading was at their time of arrival.    Past Medical History:  Diagnosis Date  . Anxiety   . Arthritis    "left hip" (04/25/2014)  . Chest pain    a. 04/2014 MV: small anteroapical perfusion defect - likely breast attenuation->low risk.  . Chronic bronchitis ([place])    "get it mostly q yr" (04/25/2014)  . Depression   . Essential hypertension   . GERD (gastroesophageal reflux disease)   . Heart murmur   . High cholesterol   . Iron deficiency anemia   . Migraine    "weekly, at least" (04/25/2014)  . VSD (ventricular septal defect)    a. 04/2014 Echo: EF 55-60%, PASP 20mmHg, small restrictive either supracristal or perimembranous VSD w/o PAH.    Patient Active Problem List   Diagnosis Date Noted  . Takotsubo cardiomyopathy  03/23/2018  . Mitral valve prolapse determined by imaging 03/23/2018  . Iron deficiency anemia 11/30/2017  . GERD (gastroesophageal reflux disease) 11/30/2017  . Essential hypertension 11/30/2017  . Epistaxis 11/30/2017  . Paramembranous VSD   . Pulmonic stenosis, congenital   . Intractable chronic migraine without aura and with status migrainosus   . Neck muscle spasm   . Cephalalgia   . Blurred vision   . Near syncope 07/18/2015  . TIA (transient ischemic attack) 07/18/2015  . Left shoulder pain 07/18/2015  . Diabetes mellitus without complication ([place]) 30/16/0109  . HLD (hyperlipidemia) 07/18/2015  . Acute loss of vision   . Bradycardia 04/26/2014  . Chest pain 04/25/2014  . SEIZURE DISORDER 11/17/2007  . Anxiety state 09/06/2006  . Depression 09/06/2006  . ALLERGIC RHINITIS 09/06/2006  . 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 HEADACHE 09/06/2006  . SYMPTOM, INCONTINENCE, URINARY NOS 09/06/2006  . SEIZURES, HX OF 09/06/2006    Past Surgical History:  Procedure Laterality Date  . CHOLECYSTECTOMY    . RIGHT/LEFT HEART CATH AND CORONARY ANGIOGRAPHY N/A 12/01/2017   Procedure: RIGHT/LEFT HEART CATH AND CORONARY ANGIOGRAPHY;  Surgeon: Leonie Man, MD;  Location: [facility];  Service: Cardiovascular;  Laterality: N/A;  . VSD REPAIR  ~ 1963   [facility]/notes 04/25/2014     OB History   No obstetric history on  file.     Family History  Problem Relation Age of Onset  . COPD Mother   . Hypertension Father   . Hyperlipidemia Father     Social History   Tobacco Use  . Smoking status: Never Smoker  . Smokeless tobacco: Never Used  Substance Use Topics  . Alcohol use: No  . Drug use: No    Home Medications Prior to Admission medications   Medication Sig Start Date End Date Taking? Authorizing Provider  busPIRone (BUSPAR) 15 MG tablet Take 15 mg by mouth 2 (two) times daily.  03/17/19  Yes [provider]  cetirizine (ZYRTEC) 10 MG  tablet Take 10 mg by mouth daily.   Yes [provider]  cholecalciferol (VITAMIN D3) 25 MCG (1000 UT) tablet Take 1,000 Units by mouth daily.   Yes [provider]  Ferrous Sulfate (IRON) 325 (65 Fe) MG TABS Take 325 mg by mouth every other day.    Yes [provider]  metFORMIN (GLUCOPHAGE-XR) 500 MG 24 hr tablet Take 500 mg by mouth daily with breakfast.  10/26/15  Yes [provider]  nitroGLYCERIN (NITROSTAT) 0.4 MG SL tablet Place 0.4 mg under the tongue every 5 (five) minutes as needed for chest pain.  01/26/18  Yes [provider]  Omega-3 Fatty Acids (FISH OIL) 1000 MG CAPS Take 1,000 mg by mouth daily.   Yes [provider]  omeprazole (PRILOSEC) 20 MG capsule Take 20 mg by mouth daily.    Yes [provider]  simvastatin (ZOCOR) 20 MG tablet Take 20 mg by mouth at bedtime. 01/26/18  Yes [provider]  traZODone (DESYREL) 100 MG tablet Take 300 mg by mouth at bedtime.    Yes [provider]  clotrimazole-betamethasone (LOTRISONE) cream Apply 1 application topically 2 (two) times daily. TO AFFECTED AREA-GROIN Patient not taking: Reported on 12/13/2019 12/15/17   Lendon Colonel, NP  metoprolol succinate (TOPROL-XL) 25 MG 24 hr tablet Take 1 tablet (25 mg total) by mouth daily. 12/14/19   Charlesetta Shanks, MD    Allergies    Flonase [fluticasone propionate], Other, and Ace inhibitors  Review of Systems   Review of Systems 10 systems reviewed and negative except as per HPI Physical Exam Updated Vital Signs BP (!) 153/65   Pulse 72   Temp 98.8 F (37.1 C) (Oral)   Resp (!) 23   SpO2 98%   Physical Exam Constitutional:      Appearance: She is well-developed. She is not ill-appearing, toxic-appearing or diaphoretic.  HENT:     Head: Normocephalic and atraumatic.  Eyes:     Extraocular Movements: Extraocular movements intact.  Cardiovascular:     Rate and Rhythm: Normal rate and regular rhythm.      Heart sounds: Murmur heard.      Comments: 3\6 systolic ejection murmur left upper sternal border.  Heart regular. Pulmonary:     Effort: Pulmonary effort is normal.     Breath sounds: Normal breath sounds.  Chest:     Chest wall: No tenderness.  Abdominal:     General: Bowel sounds are normal. There is no distension.     Palpations: Abdomen is soft.     Tenderness: There is no abdominal tenderness.  Musculoskeletal:        General: No swelling or tenderness. Normal range of motion.     Cervical back: Neck supple.     Right lower leg: No edema.     Left lower leg: No  edema.  Skin:    General: Skin is warm and dry.  Neurological:     General: No focal deficit present.     Mental Status: She is alert and oriented to person, place, and time.     GCS: GCS eye subscore is 4. GCS verbal subscore is 5. GCS motor subscore is 6.     Coordination: Coordination normal.  Psychiatric:        Mood and Affect: Mood normal.     ED Results / Procedures / Treatments   Labs (all labs ordered are listed, but only abnormal results are displayed) Labs Reviewed  BASIC METABOLIC PANEL - Abnormal; Notable for the following components:      Result Value   Glucose, Bld 133 (*)    All other components within normal limits  CBC - Abnormal; Notable for the following components:   Hemoglobin 11.0 (*)    MCV 76.3 (*)    MCH 22.9 (*)    RDW 16.2 (*)    All other components within normal limits  CBG MONITORING, ED - Abnormal; Notable for the following components:   Glucose-Capillary 124 (*)    All other components within normal limits  TROPONIN I (HIGH SENSITIVITY) - Abnormal; Notable for the following components:   Troponin I (High Sensitivity) 21 (*)    All other components within normal limits  TROPONIN I (HIGH SENSITIVITY) - Abnormal; Notable for the following components:   Troponin I (High Sensitivity) 23 (*)    All other components within normal limits  HEPATIC FUNCTION PANEL  LIPASE, BLOOD    TROPONIN I (HIGH SENSITIVITY)    EKG EKG Interpretation  Date/Time:  Monday December 13 2019 16:09:15 EDT Ventricular Rate:  86 PR Interval:    QRS Duration: 157 QT Interval:  416 QTC Calculation: 498 R Axis:   94 Text Interpretation: Sinus rhythm Prolonged PR interval RBBB and LPFB Baseline wander in lead(s) III Dynamic lateral T wave changes compared to first previous, older tracings show lateral T waves upright. old RBBB Confirmed by Charlesetta Shanks [phone]) on 12/13/2019 4:14:01 PM   Radiology DG Chest 2 View  Result Date: 12/13/2019 CLINICAL DATA:  Chest pain EXAM: CHEST - 2 VIEW COMPARISON:  11/29/2017 FINDINGS: Lungs are clear. No pneumothorax or pleural effusion. Median sternotomy has been performed. Cardiac size is within normal limits. Pulmonary vascularity is normal. No acute bone abnormality. IMPRESSION: No active cardiopulmonary disease. Electronically Signed   By: Fidela Salisbury MD   On: 12/13/2019 16:50   CT Angio Chest/Abd/Pel for Dissection W and/or W/WO  Result Date: 12/13/2019 CLINICAL DATA:  Chest pain, question of aortic dissection EXAM: CT ANGIOGRAPHY CHEST, ABDOMEN AND PELVIS TECHNIQUE: Non-contrast CT of the chest was initially obtained. Multidetector CT imaging through the chest, abdomen and pelvis was performed using the standard protocol during bolus administration of intravenous contrast. Multiplanar reconstructed images and MIPs were obtained and reviewed to evaluate the vascular anatomy. CONTRAST:  16mL OMNIPAQUE IOHEXOL 350 MG/ML SOLN COMPARISON:  None. FINDINGS: CTA CHEST FINDINGS Cardiovascular: --Heart: The heart size is mildly enlarged. There is nopericardial effusion. Overlying median sternotomy wires are present. --Aorta: There is a right-sided aortic arch present without aneurysmal dilatation. There is mild aortic atherosclerotic calcification. Precontrast images show no aortic intramural hematoma. There is no blood pool, dissection or penetrating ulcer  demonstrated on arterial phase postcontrast imaging. There is a conventional 3 vessel aortic arch branching pattern. The proximal arch vessels are widely patent. --Pulmonary Arteries: Contrast timing is optimized for  preferential opacification of the aorta. Within that limitation, normal central pulmonary arteries. Mediastinum/Nodes: No mediastinal, hilar or axillary lymphadenopathy. The visualized thyroid and thoracic esophageal course are unremarkable. Lungs/Pleura: No pulmonary nodules or masses. No pleural effusion or pneumothorax. No focal airspace consolidation. No focal pleural abnormality. Musculoskeletal: No chest wall abnormality. No acute osseous findings. Review of the MIP images confirms the above findings. CTA ABDOMEN AND PELVIS FINDINGS VASCULAR Aorta: Normal caliber aorta without aneurysm, dissection, vasculitis or hemodynamically significant stenosis. There is mild aortic atherosclerosis. Celiac: No aneurysm, dissection or hemodynamically significant stenosis. Normal branching pattern SMA: Widely patent without dissection or stenosis. Renals: Single renal arteries bilaterally. No aneurysm, dissection, stenosis or evidence of fibromuscular dysplasia. IMA: Patent without abnormality. Inflow: No aneurysm, stenosis or dissection. Veins: Normal course and caliber of the major veins. Assessment is otherwise limited by the arterial dominant contrast phase. Review of the MIP images confirms the above findings. NON-VASCULAR Hepatobiliary: Normal hepatic contours and density. No visible biliary dilatation. The patient is status post cholecystectomy. No biliary ductal dilation. Pancreas: Normal contours without ductal dilatation. No peripancreatic fluid collection. Spleen: Normal arterial phase splenic enhancement pattern. Adrenals/Urinary Tract: --Adrenal glands: Normal. --Right kidney/ureter: No hydronephrosis or perinephric stranding. No nephrolithiasis. No obstructing ureteral stones. --Left kidney/ureter:  No hydronephrosis or perinephric stranding. No nephrolithiasis. No obstructing ureteral stones. --Urinary bladder: Unremarkable. Stomach/Bowel: --Stomach/Duodenum: No hiatal hernia or other gastric abnormality. Normal duodenal course and caliber. --Small bowel: No dilatation or inflammation. --Colon: No focal abnormality. Scattered colonic diverticula are seen. Lymphatic:  No abdominal or pelvic lymphadenopathy. Reproductive: No free fluid in the pelvis. Musculoskeletal. No bony spinal canal stenosis or focal osseous abnormality. Other: Small fat containing anterior umbilical hernia seen. Review of the MIP images confirms the above findings. IMPRESSION: 1. Right-sided aortic arch without evidence of acute aortic abnormality. 2.  Aortic Atherosclerosis (ICD10-I70.0). 3. No other acute intra-abdominal or pelvic pathology to explain the patient's symptoms. Electronically Signed   By: Prudencio Pair M.D.   On: 12/13/2019 19:53    Procedures Procedures (including critical care time)  Medications Ordered in ED Medications  sodium chloride flush (NS) 0.9 % injection 3 mL (has no administration in time range)  nitroGLYCERIN (NITROSTAT) SL tablet 0.4 mg (has no administration in time range)  metoprolol succinate (TOPROL-XL) 24 hr tablet 25 mg (25 mg Oral Given 12/13/19 2110)  pantoprazole (PROTONIX) injection 40 mg (40 mg Intravenous Given 12/13/19 2015)  iohexol (OMNIPAQUE) 350 MG/ML injection 100 mL (100 mLs Intravenous Contrast Given 12/13/19 1927)  metoprolol tartrate (LOPRESSOR) injection 5 mg (5 mg Intravenous Given 12/13/19 2319)    ED Course  I have reviewed the triage vital signs and the nursing notes.  Pertinent labs & imaging results that were available during my care of the patient were reviewed by me and considered in my medical decision making (see chart for details).    MDM Rules/Calculators/A&P                         Patient presents with chest tightness that started yesterday.  This is been  persistent with some waxing and waning severity.  Patient reported her blood pressure at home had measured at 338 systolic before calling EMS.  By the time of arrival this is significantly normalized.  Patient's EKG did not show acute ischemic changes.  Patient has history of Takotsubo cardiomyopathy.  She had a catheterization within the last 2 years that showed clear coronary arteries.  Troponins are flat  without significant elevation, patient is not showing signs of heart failure at this time.  I doubt a recurrence of Takotsubo based on current diagnostic and clinical evaluation.  Patient is supposed to been on Toprol-XL based on review of last cardiology visit.  Patient does not think she has been taking this medication but does not know of any specific adverse reaction why it would have been discontinued.  She reports she has changed doctors and after discussing it, suspect it may have not been renewed.  Patient has had borderline hypertension in the emergency department with blood pressures in the 150s over 60s.  Will resume Toprol and advised the patient to keep a log of blood pressures and heart rates to review with her doctor.  CTA of the chest does not show any evidence of dissection or other significant structural abnormality.  At this time, feel patient is stable to continue follow-up with her cardiologist.  Return precautions reviewed.  Final Clinical Impression(s) / ED Diagnoses Final diagnoses:  Precordial chest pain    Rx / DC Orders ED Discharge Orders         Ordered    metoprolol succinate (TOPROL-XL) 25 MG 24 hr tablet  Daily     Discontinue  Reprint     12/14/19 0014           Charlesetta Shanks, MD 12/14/19 0028

## 2019-12-14 LAB — TROPONIN I (HIGH SENSITIVITY): Troponin I (High Sensitivity): 23 ng/L — ABNORMAL HIGH (ref <18)

## 2019-12-14 MED ORDER — METOPROLOL SUCCINATE ER 25 MG PO TB24: 25.0000 mg | ORAL_TABLET | Freq: Every day | ORAL | 1 refills | Status: DC

## 2019-12-14 NOTE — Discharge Instructions (Addendum)
1.  Start taking Toprol-XL 25 mg daily.  Monitor your blood pressure and heart rate.  Review your results with your doctor within the next 3 to 5 days.  You may need a dose increase if blood pressure or heart rate remain elevated.  Review of your last cardiology visit indicates the plan was for you to be on this medication permanently unless you had an adverse reaction to it. 2.  Take omeprazole twice daily.  Take your first dose 30 minutes in the morning before you eat anything in your second dose about 30 minutes before dinnertime. 3.  Schedule an appointment with your cardiologist as soon as possible.  Let them know you are seen in the emergency department today.  You may need a repeat echocardiogram. 4.  Return to the emergency department immediately if you have increasing or worsening pain, shortness of breath, lightheadedness or other concerning symptoms.

## 2020-01-05 ENCOUNTER — Ambulatory Visit: Payer: Medicare Other | Admitting: Cardiology

## 2020-01-12 ENCOUNTER — Other Ambulatory Visit: Payer: Self-pay

## 2020-01-12 ENCOUNTER — Emergency Department (HOSPITAL_COMMUNITY)
Admission: EM | Admit: 2020-01-12 | Discharge: 2020-01-12 | Disposition: A | Discharge status: Home / Self Care | Payer: Medicare Other | Attending: Emergency Medicine | Admitting: Emergency Medicine

## 2020-01-12 ENCOUNTER — Encounter (HOSPITAL_COMMUNITY): Payer: Self-pay | Admitting: Emergency Medicine

## 2020-01-12 DIAGNOSIS — R42 Dizziness and giddiness: Secondary | ICD-10-CM | POA: Insufficient documentation

## 2020-01-12 DIAGNOSIS — R110 Nausea: Secondary | ICD-10-CM | POA: Diagnosis not present

## 2020-01-12 DIAGNOSIS — R519 Headache, unspecified: Secondary | ICD-10-CM | POA: Diagnosis not present

## 2020-01-12 DIAGNOSIS — Z5321 Procedure and treatment not carried out due to patient leaving prior to being seen by health care provider: Secondary | ICD-10-CM | POA: Diagnosis not present

## 2020-01-12 DIAGNOSIS — R55 Syncope and collapse: Secondary | ICD-10-CM | POA: Diagnosis present

## 2020-01-12 LAB — COMPREHENSIVE METABOLIC PANEL
ALT: 13 U/L (ref 0–44)
AST: 20 U/L (ref 15–41)
Albumin: 3.9 g/dL (ref 3.5–5.0)
Alkaline Phosphatase: 48 U/L (ref 38–126)
Anion gap: 8 (ref 5–15)
BUN: 11 mg/dL (ref 6–20)
CO2: 28 mmol/L (ref 22–32)
Calcium: 9.5 mg/dL (ref 8.9–10.3)
Chloride: 103 mmol/L (ref 98–111)
Creatinine, Ser: 0.65 mg/dL (ref 0.44–1.00)
GFR calc Af Amer: >60 mL/min (ref >60)
GFR calc non Af Amer: >60 mL/min (ref >60)
Glucose, Bld: 146 mg/dL — ABNORMAL HIGH (ref 70–99)
Potassium: 3.9 mmol/L (ref 3.5–5.1)
Sodium: 139 mmol/L (ref 135–145)
Total Bilirubin: 0.5 mg/dL (ref 0.3–1.2)
Total Protein: 6.4 g/dL — ABNORMAL LOW (ref 6.5–8.1)

## 2020-01-12 LAB — CBC WITH DIFFERENTIAL/PLATELET
Abs Immature Granulocytes: 0.08 10*3/uL — ABNORMAL HIGH (ref 0.00–0.07)
Basophils Absolute: 0.1 10*3/uL (ref 0.0–0.1)
Basophils Relative: 1 %
Eosinophils Absolute: 0.1 10*3/uL (ref 0.0–0.5)
Eosinophils Relative: 1 %
HCT: 36.8 % (ref 36.0–46.0)
Hemoglobin: 10.9 g/dL — ABNORMAL LOW (ref 12.0–15.0)
Immature Granulocytes: 1 %
Lymphocytes Relative: 20 %
Lymphs Abs: 2.5 10*3/uL (ref 0.7–4.0)
MCH: 22.8 pg — ABNORMAL LOW (ref 26.0–34.0)
MCHC: 29.6 g/dL — ABNORMAL LOW (ref 30.0–36.0)
MCV: 76.8 fL — ABNORMAL LOW (ref 80.0–100.0)
Monocytes Absolute: 1.1 10*3/uL — ABNORMAL HIGH (ref 0.1–1.0)
Monocytes Relative: 9 %
Neutro Abs: 8.4 10*3/uL — ABNORMAL HIGH (ref 1.7–7.7)
Neutrophils Relative %: 68 %
Platelets: 267 10*3/uL (ref 150–400)
RBC: 4.79 MIL/uL (ref 3.87–5.11)
RDW: 16.2 % — ABNORMAL HIGH (ref 11.5–15.5)
WBC: 12.3 10*3/uL — ABNORMAL HIGH (ref 4.0–10.5)
nRBC: 0 % (ref 0.0–0.2)

## 2020-01-12 LAB — URINALYSIS, ROUTINE W REFLEX MICROSCOPIC
Bacteria, UA: NONE SEEN
Bilirubin Urine: NEGATIVE
Glucose, UA: NEGATIVE mg/dL
Hgb urine dipstick: NEGATIVE
Ketones, ur: NEGATIVE mg/dL
Nitrite: NEGATIVE
Protein, ur: NEGATIVE mg/dL
Specific Gravity, Urine: 1.023 (ref 1.005–1.030)
pH: 5 (ref 5.0–8.0)

## 2020-01-12 NOTE — ED Triage Notes (Signed)
Patient arrived with EMS from home brief syncopal episode at home this evening with dizziness and nausea , she received Zofran 4 mg IV by EMS , CBG= 147, alert and oriented at arrival/respirations unlabored , patient stated headache with mild dizziness.

## 2020-01-12 NOTE — ED Notes (Signed)
Patients ride here. Encouraged pt to stay. Patient states she will call doctor in the morning. LWBS

## 2020-01-12 NOTE — ED Notes (Signed)
Pt is stating that she is wanting to leave. Stephanie Bray, ED tech took IV out. Pt is waiting for ride.

## 2020-01-13 ENCOUNTER — Telehealth: Payer: Self-pay | Admitting: Cardiology

## 2020-01-13 NOTE — Telephone Encounter (Signed)
I spoke with patient. She reports she received first dose of Moderna covid vaccine at [facility] this past Tuesday.  She waited 15 minutes afterwards and felt fine.  Later that evening she was at her son's house and passed out.  Her son said she just "dropped."  Patient reports she was not dizzy and does not remember feeling bad prior to passing out.  EMS was called and patient reports feeling dizzy when EMS was there.  States EMS told her BP was elevated and heart rate was 44.  She was transported to hospital. She started feeling better so left without being seen due to wait time.   Today she is feeling fine except for headache and some dizziness.  She reports these symptoms have been going on for awhile.  She is asking if they could be related to Toprol.  She had been off this for awhile and it was resumed at ED visit on 8/9.  Her BP last night was 136/66 and heart rate was 66.  I advised her to continue Toprol and keep record of BP and heart rate. She will bring readings to office visit on 9/28.  I told her to make sure she stays hydrated.  Patient is asking if Dr Harrell Gave would like to see her sooner? I placed call to patient to make sure she also schedules appointment with PCP for follow up but call went to voicemail. Left message to call office

## 2020-01-13 NOTE — Telephone Encounter (Signed)
STAT if patient feels like he/she is going to faint   1) Are you dizzy now? yes  2) Do you feel faint or have you passed out? Tuesday she passed out.   3) Do you have any other symptoms? She had headache and felt dizzy after she had COVID shot, she states she didn't feel this way before she had the shot.    4) Have you checked your HR and BP (record if available)? 133/66  last night When she pass out she was taken to there ER at [facility].  She wants to know if she should still take her BP medication.

## 2020-01-14 NOTE — Telephone Encounter (Signed)
Would continue toprol for now, but she can change to taking before bed to see if that helps (instead of morning). Thanks

## 2020-01-14 NOTE — Telephone Encounter (Signed)
I spoke with patient and gave her information from Dr Harrell Gave. I also advised her to follow up with PCP after recent ED visit Patient reports since starting Toprol she has been having off and on dizziness and headache.  She takes Toprol in the morning with breakfast and an hour later the dizziness and headache starts.  Lasts until mid afternoon.  Does not feel she is going to pass out.  BP was 124/94 and heart rate 65. She is on metformin and diabetes is diet controlled.  She does not check blood sugar at home.  I asked her to check BP and heart rate about 2 hours after taking AM Toprol. Will forward to Dr Harrell Gave to see if patient should continue Toprol or if any changes need to be made prior to upcoming office visit.

## 2020-01-14 NOTE — Telephone Encounter (Signed)
Thanks. Given her current vitals I think we are ok to wait until her scheduled appt, but if symptoms recur please have her call us and let us know.

## 2020-01-14 NOTE — Telephone Encounter (Signed)
Left message to call office

## 2020-01-17 ENCOUNTER — Telehealth: Payer: Self-pay | Admitting: Cardiology

## 2020-01-17 NOTE — Telephone Encounter (Signed)
Left message to call back  

## 2020-01-17 NOTE — Telephone Encounter (Signed)
Please see previous encounter

## 2020-01-17 NOTE — Telephone Encounter (Signed)
Patient returning Stephanie Bray's call.

## 2020-01-17 NOTE — Telephone Encounter (Signed)
Pt updated with MD's recommendations and voiced she was taking medication in the morning. Pt report she has since stopped medication and feels better. Appointment scheduled for 9/15 to discuss further.

## 2020-01-19 ENCOUNTER — Telehealth: Payer: Medicare Other | Admitting: Cardiology

## 2020-01-24 ENCOUNTER — Encounter: Payer: Self-pay | Admitting: Cardiology

## 2020-01-24 ENCOUNTER — Telehealth (INDEPENDENT_AMBULATORY_CARE_PROVIDER_SITE_OTHER): Payer: Medicare Other | Admitting: Cardiology

## 2020-01-24 VITALS — BP 121/89 | HR 94 | Ht <= 58 in | Wt 141.0 lb

## 2020-01-24 DIAGNOSIS — I341 Nonrheumatic mitral (valve) prolapse: Secondary | ICD-10-CM

## 2020-01-24 DIAGNOSIS — R634 Abnormal weight loss: Secondary | ICD-10-CM | POA: Diagnosis not present

## 2020-01-24 DIAGNOSIS — I5181 Takotsubo syndrome: Secondary | ICD-10-CM

## 2020-01-24 DIAGNOSIS — R42 Dizziness and giddiness: Secondary | ICD-10-CM

## 2020-01-24 DIAGNOSIS — R519 Headache, unspecified: Secondary | ICD-10-CM

## 2020-01-24 DIAGNOSIS — I10 Essential (primary) hypertension: Secondary | ICD-10-CM

## 2020-01-24 DIAGNOSIS — Q221 Congenital pulmonary valve stenosis: Secondary | ICD-10-CM

## 2020-01-24 DIAGNOSIS — R9431 Abnormal electrocardiogram [ECG] [EKG]: Secondary | ICD-10-CM

## 2020-01-24 DIAGNOSIS — R55 Syncope and collapse: Secondary | ICD-10-CM

## 2020-01-24 NOTE — Patient Instructions (Signed)
Medication Instructions:  Stop: Metoprolol Succinate 25 mg daily  *If you need a refill on your cardiac medications before your next appointment, please call your pharmacy*   Lab Work: None ordered   Testing/Procedures: None ordered    Follow-Up: At [facility], you and your health needs are our priority.  As part of our continuing mission to provide you with exceptional heart care, we have created designated Provider Care Teams.  These Care Teams include your primary Cardiologist (physician) and Advanced Practice Providers (APPs -  Physician Assistants and Nurse Practitioners) who all work together to provide you with the care you need, when you need it.  We recommend signing up for the patient portal called "MyChart".  Sign up information is provided on this After Visit Summary.  MyChart is used to connect with patients for Virtual Visits (Telemedicine).  Patients are able to view lab/test results, encounter notes, upcoming appointments, etc.  Non-urgent messages can be sent to your provider as well.   To learn more about what you can do with MyChart, go to [email or web address].    Your next appointment:   1 month(s)  The format for your next appointment:   Virtual Visit   Provider:   Buford Dresser, MD

## 2020-01-24 NOTE — Progress Notes (Signed)
Virtual Visit via Telephone Note   This visit type was conducted due to national recommendations for restrictions regarding the COVID-19 Pandemic (e.g. social distancing) in an effort to limit this patient's exposure and mitigate transmission in our community.  Due to her co-morbid illnesses, this patient is at least at moderate risk for complications without adequate follow up.  This format is felt to be most appropriate for this patient at this time.  The patient did not have access to video technology/had technical difficulties with video requiring transitioning to audio format only (telephone).  All issues noted in this document were discussed and addressed.  No physical exam could be performed with this format.  Please refer to the patient's chart for her  consent to telehealth for [facility].   The patient was identified using 2 identifiers.  Patient Location: Home Provider Location: Home Office  Date:  01/24/2020   ID:  Stephanie Bray, DOB 07/03/1960, MRN [id number]  PCP:  [facility]  Cardiologist:  Buford Dresser, MD PhD  Referring MD: [facility]*   CC: follow up  History of Present Illness:    Stephanie Bray is a 59 y.o. female with a hx of hypertension, hyperlipidemia, diabetes, TIA, VSD who is seen in follow up for the evaluation and management of takotsubo cardiomyopathy.   Cardiac history: admitted 11/2018 with chest pain/epistaxis. Had [facility] with normal (though tortuous) coronaries. Had moderate PH and severe LV dysfunction consistent with takotsubo cardiomyopathy. Her pulmonary valve gradient on pullback during RHC was 15 mmHg. No significant shunt by Qp/Qs.   She had a repeat echo 3 mos later, which showed improvement of her LVEF to 50-55%. She again had a perimembranous VSD noted. She was noted to have moderate anterior leaflet mitral valve prolapse without significant regurgitation. RV size and function was normal.  Pulmonic valve gradient noted to be elevated from 11 to 21 mmHg compared to prior.  Today: Reviewed recent phone notes, had episode of syncope after Moderna vaccine about two weeks ago. EMS noted elevated blood pressure and heart rate of 44 on arrival. Transported to ER but left without being seen due to wait time.   Noted headaches and mild dizziness. Feels like she can't drive. Has felt poorly since after her ER visit  Blood pressure has been variable. Yesterday 121/89, HR 94. Recent 160/68, 148/60, 106/62, 133/66, 124/94, 133/63, 113/52.  Range in HR has been 60-90s, mostly 60-70s.  Reviewed ER note from 01/12/20. No more chest pain since starting acid reflux medication. Started on metoprolol at that time, has felt poorly since that time. Only episode of passing out was after Covid vaccine, but she thinks she may have taken an extra metoprolol with her trazodone.  Has been losing weight unintentionally. Has an appetite, but can't hold weight on no matter what she eats.She has not discussed with her PCP.  Denies chest pain, shortness of breath at rest or with normal exertion. No PND, orthopnea, LE edema or unexpected weight gain. No syncope or palpitations.  Past Medical History:  Diagnosis Date  . Anxiety   . Arthritis    "left hip" (04/25/2014)  . Chest pain    a. 04/2014 MV: small anteroapical perfusion defect - likely breast attenuation->low risk.  . Chronic bronchitis ([place])    "get it mostly q yr" (04/25/2014)  . Depression   . Essential hypertension   . GERD (gastroesophageal reflux disease)   . Heart murmur   . High  cholesterol   . Iron deficiency anemia   . Migraine    "weekly, at least" (04/25/2014)  . VSD (ventricular septal defect)    a. 04/2014 Echo: EF 55-60%, PASP 81mmHg, small restrictive either supracristal or perimembranous VSD w/o PAH.    Past Surgical History:  Procedure Laterality Date  . CHOLECYSTECTOMY    . RIGHT/LEFT HEART CATH AND CORONARY ANGIOGRAPHY  N/A 12/01/2017   Procedure: RIGHT/LEFT HEART CATH AND CORONARY ANGIOGRAPHY;  Surgeon: Leonie Man, MD;  Location: [facility];  Service: Cardiovascular;  Laterality: N/A;  . VSD REPAIR  ~ 1963   [facility]/notes 04/25/2014    Current Medications: Current Outpatient Medications on File Prior to Visit  Medication Sig  . busPIRone (BUSPAR) 15 MG tablet Take 15 mg by mouth 2 (two) times daily.   . cetirizine (ZYRTEC) 10 MG tablet Take 10 mg by mouth daily.  . cholecalciferol (VITAMIN D3) 25 MCG (1000 UT) tablet Take 1,000 Units by mouth daily.  . Ferrous Sulfate (IRON) 325 (65 Fe) MG TABS Take 325 mg by mouth every other day.   . metFORMIN (GLUCOPHAGE-XR) 500 MG 24 hr tablet Take 500 mg by mouth daily with breakfast.   . nitroGLYCERIN (NITROSTAT) 0.4 MG SL tablet Place 0.4 mg under the tongue every 5 (five) minutes as needed for chest pain.   . Omega-3 Fatty Acids (FISH OIL) 1000 MG CAPS Take 1,000 mg by mouth daily.  Marland Kitchen omeprazole (PRILOSEC) 20 MG capsule Take 20 mg by mouth daily.   . simvastatin (ZOCOR) 20 MG tablet Take 20 mg by mouth at bedtime.  . traZODone (DESYREL) 100 MG tablet Take 300 mg by mouth at bedtime.    No current facility-administered medications on file prior to visit.     Allergies:   Flonase [fluticasone propionate], Other, and Ace inhibitors   Social History   Tobacco Use  . Smoking status: Never Smoker  . Smokeless tobacco: Never Used  Substance Use Topics  . Alcohol use: No  . Drug use: No    Family History: The patient's family history includes COPD in her mother; Hyperlipidemia in her father; Hypertension in her father.  ROS:   Please see the history of present illness.  Additional pertinent ROS otherwise unremarkable.  EKGs/Labs/Other Studies Reviewed:    The following studies were personally reviewed today:  Echo 02/17/18 Study Conclusions  - Left ventricle: The cavity size was normal. Systolic function was   normal. The estimated  ejection fraction was in the range of 50%   to 55%. Wall motion was normal; there were no regional wall   motion abnormalities. Doppler parameters are consistent with   abnormal left ventricular relaxation (grade 1 diastolic   dysfunction). Doppler parameters are consistent with high   ventricular filling pressure. - Ventricular septum: There was a defect in the perimembranous   region. - Aortic valve: Transvalvular velocity was within the normal range.   There was no stenosis. There was no regurgitation. - Mitral valve: Moderate prolapse, involving the anterior leaflet.   Transvalvular velocity was within the normal range. There was no   evidence for stenosis. There was no regurgitation. - Right ventricle: The cavity size was normal. Wall thickness was   normal. Systolic function was normal. - Atrial septum: No defect or patent foramen ovale was identified. - Tricuspid valve: There was mild regurgitation. - Pulmonic valve: The findings are consistent with moderate   stenosis. Peak gradient (S): 38 mm Hg. - Pulmonary arteries: Systolic pressure was  within the normal   range.  Impressions:  - Compared with the echo 11/2017, the mean pulmonic valve gradient   has increased from 11 mmHg to 21 mmHg.  Echocardiogram 11/30/2017 Left ventricle: There is evidence of perimembranous or supracristal VSD by colorflow Doppler with peak gradient of 23mmHg. The cavity size was mildly dilated. Systolic function was moderately reduced. The estimated ejection fraction was in the range of 35% to 40%. There is akinesis of the apical anterior, apical septal, lateral, inferolateral, inferior, and apical myocardium and suggestive of stress induced cardiomyopathy. Features are consistent with a pseudonormal left ventricular filling pattern, with concomitant abnormal relaxation and increased filling pressure (grade 2 diastolic dysfunction). Doppler parameters are consistent with high  ventricular filling pressure. - Mitral valve: Calcified annulus. Mild, late systolicsystolic bowing without prolapse, involving the anterior leaflet. - Left atrium: The atrium was moderately dilated. - Atrial septum: There was increased thickness of the septum, consistent with lipomatous hypertrophy. - Tricuspid valve: There was trivial regurgitation. - Pulmonic valve: Pulmonic valve VTI 44.1 cm and peak velocity 235cm/sec with mean gradient of 62mmHg consistent with moderate pulmonic stenosis. The findings are consistent with moderate stenosis. - Pulmonary arteries: Systolic pressure could not be accurately estimated.  Impressions:  - Compared to prior echo, there is now akinesis of the apical segments of the LV suggestive of stress cardiomyopathy with EF 35-40% with grade 2 DD. There is a perimembranous or suprcristal VSD with a peak gradient of 70mmHg. Cannot accurately assess PASP. There is evidence of pulmonic stenosis but the mean gradient has decreased from 3mmHg to 1mmHg compared to priorLeft ventricle: There is evidence of perimembranous or supracristal VSD by colorflow Doppler with peak gradient of 46mmHg. The cavity size was mildly dilated. Systolic function was moderately reduced. The estimated ejection fraction was in the range of 35% to 40%. There is akinesis of the apical anterior, apical septal, lateral, inferolateral, inferior, and apical myocardium and suggestive of stress induced cardiomyopathy. Features are consistent with a pseudonormal left ventricular filling pattern, with concomitant abnormal relaxation and increased filling pressure (grade 2 diastolic dysfunction). Doppler parameters are consistent with high ventricular filling pressure. - Mitral valve: Calcified annulus. Mild, late systolicsystolic bowing without prolapse, involving the anterior leaflet. - Left atrium: The atrium was moderately  dilated. - Atrial septum: There was increased thickness of the septum, consistent with lipomatous hypertrophy. - Tricuspid valve: There was trivial regurgitation. - Pulmonic valve: Pulmonic valve VTI 44.1 cm and peak velocity 235cm/sec with mean gradient of 32mmHg consistent with moderate pulmonic stenosis. The findings are consistent with moderate stenosis. - Pulmonary arteries: Systolic pressure could not be accurately estimated.  Impressions:  - Compared to prior echo, there is now akinesis of the apical segments of the LV suggestive of stress cardiomyopathy with EF 35-40% with grade 2 DD. There is a perimembranous or suprcristal VSD with a peak gradient of 28mmHg. Cannot accurately assess PASP. There is evidence of pulmonic stenosis but the mean gradient has decreased from 65mmHg to 10mmHg compared to prior   Cardiac Cath 12/01/2017 Conclusion     Hemodynamic findings consistent with moderate pulmonary hypertension.  Angiographically normal coronary arteries, somewhat tortuous  There is mild pulmonic valve stenosis.  LV end diastolic pressure is moderately elevated.  There is severe left ventricular systolic dysfunction.  The left ventricular ejection fraction is 25-35% by visual estimate.  There is trivial (1+) mitral regurgitation.  There is no aortic valve stenosis.  There is mild mitral valve prolapse.   Angiographically  normal coronary arteries.  Severely reduced LVEF of roughly 25 to 30% with global hypokinesis (worse in the anterior and apical walls) -consistent with Takotsubo/stress-induced cardiomyopathy  Mild mitral prolapse noted but no significant MR.  Pulmonary valve gradient of roughly 15 mmHg on pullback.  Cardiac output/index by Fick: 4.18, 2.52. Thermodilution 6.22, 3.74  No evidence of shunt by Qp/Qs=1; significant mixing of blood from IVC, coronary sinus and SVC (IVC sat 76%, SVC sat 66%, RA sat 64% -> RV sat  74%, PA sat 75%)     EKG:  EKG is personally reviewed.  The ekg ordered 01/12/20 demonstrates sinus bradycardia at 59 bpm, RBBB, LPFB, st degree AV block  Recent Labs: 01/12/2020: ALT 13; BUN 11; Creatinine, Ser 0.65; Hemoglobin 10.9; Platelets 267; Potassium 3.9; Sodium 139  Recent Lipid Panel    Component Value Date/Time   CHOL 169 11/30/2017 0332   TRIG 37 11/30/2017 0332   HDL 57 11/30/2017 0332   CHOLHDL 3.0 11/30/2017 0332   VLDL 7 11/30/2017 0332   LDLCALC 105 (H) 11/30/2017 0332    Physical Exam:    VS:  BP 121/89   Pulse 94   Ht 4\' 10"  (1.473 m)   Wt 141 lb (64 kg)   BMI 29.47 kg/m     Wt Readings from Last 3 Encounters:  01/24/20 141 lb (64 kg)  01/12/20 158 lb 11.7 oz (72 kg)  03/31/19 151 lb (68.5 kg)    Speaking comfortably on the phone, no audible wheezing In no acute distress Alert and oriented Normal affect Normal speech  ASSESSMENT:    1. Frequent headaches   2. Dizziness   3. Unintentional weight loss   4. Takotsubo cardiomyopathy   5. Mitral valve prolapse determined by imaging   6. Pulmonic stenosis, congenital   7. Abnormal ECG   8. Essential hypertension   9. Syncope, unspecified syncope type    PLAN:    Headaches, dizziness, episode of syncope: -she felt like this is since starting metoprolol after ER visit in August. Appears she may have been off metoprolol for a time (cannot confirm, but she was taking metoprolol at our last visit 03/31/19) and was then restarted based on her description -one episode of syncope after vaccine, may have been due to medication vs. Other -given that her blood pressure is rarely elevated, will stop metoprolol and monitor symptoms -given her conduction disease on ECG, concern that she may have periodic blocks. If symptoms persist off metoprolol, would get a monitor and echo  Unintentional weight loss: reports good appetite but losing weight -given her symptoms and weight loss, recommend she followup with PCP  to determine if additional testing needed  Takotsubo cardiomyopathy: resolved, EF improved -not tolerating beta blocker, as above  VSD, mitral valve prolapse, mild pulmonic stenosis: no intervention required -VSD: no significant shunt, no evidence of volume overload on echo -mitral valve prolapse: no significant regurgitation -pulmonic stenosis: 15 mmHg on pullback, between 11 and 21 mmHg mean on echo.   Hypertension: -as above, stopping metoprolol -she will monitor at home. Will follow up in about 1 month. Would use different agent (not beta blocker) if she needs a medication. Had cough on ACEi  Abnormal ECG: RBBB, LPFB, first degree AV block.  Prevention counseling: Secondary prevention given TIA history, NSTEMI (though normal cors) -recommend heart healthy/Mediterranean diet, with whole grains, fruits, vegetable, fish, lean meats, nuts, and olive oil. Limit salt. -recommend moderate walking, 3-5 times/week for 30-50 minutes each session. Aim for at  least 150 minutes.week. Goal should be pace of 3 miles/hours, or walking 1.5 miles in 30 minutes -recommend avoidance of tobacco products. Avoid excess alcohol. -Additional risk factor control:  -Diabetes: A1c is 6.4. Consider SGLT2 inhibitor in the future to prevent CV events.  -Lipids: last LDL 105. No CAD angiographically, but does have history of TIA. Ideal goal LDL <70.   -now on simvastatin, tolerating. Will hold on aspirin repeat discussion given recent syncope.   Plan for follow up: 1 mos to review BP meds  Medication Adjustments/Labs and Tests Ordered: Current medicines are reviewed at length with the patient today.  Concerns regarding medicines are outlined above.  No orders of the defined types were placed in this encounter.  No orders of the defined types were placed in this encounter.   Patient Instructions  Medication Instructions:  Stop: Metoprolol Succinate 25 mg daily  *If you need a refill on your cardiac  medications before your next appointment, please call your pharmacy*   Lab Work: None ordered   Testing/Procedures: None ordered    Follow-Up: At [facility], you and your health needs are our priority.  As part of our continuing mission to provide you with exceptional heart care, we have created designated Provider Care Teams.  These Care Teams include your primary Cardiologist (physician) and Advanced Practice Providers (APPs -  Physician Assistants and Nurse Practitioners) who all work together to provide you with the care you need, when you need it.  We recommend signing up for the patient portal called "MyChart".  Sign up information is provided on this After Visit Summary.  MyChart is used to connect with patients for Virtual Visits (Telemedicine).  Patients are able to view lab/test results, encounter notes, upcoming appointments, etc.  Non-urgent messages can be sent to your provider as well.   To learn more about what you can do with MyChart, go to [email or web address].    Your next appointment:   1 month(s)  The format for your next appointment:   Virtual Visit   Provider:   Buford Dresser, MD       Signed, Buford Dresser, MD PhD 01/24/2020 3:09 PM    [place]

## 2020-02-01 ENCOUNTER — Ambulatory Visit: Payer: Medicare Other | Admitting: Cardiology

## 2020-02-03 ENCOUNTER — Telehealth: Payer: Self-pay | Admitting: Cardiology

## 2020-02-03 NOTE — Telephone Encounter (Signed)
Spoke with the patient who reports blood pressures as given below. She was taken off of metoprolol at her last visit and blood pressures have been increased ever since. She states that she has continued to have headaches and dizziness. She denies any other symptoms. Would like to know if there is another medication that she could take for blood pressure. Advised patient that I would send message to Dr. Harrell Gave for any recommendations.

## 2020-02-03 NOTE — Telephone Encounter (Signed)
Pt c/o BP issue: STAT if pt c/o blurred vision, one-sided weakness or slurred speech  1. What are your last 5 BP readings?  151/72 HR 76 144/73 HR 73 145/78 HR 79 144/69 HR 78 152/70 HR 74  2. Are you having any other symptoms (ex. Dizziness, headache, blurred vision, passed out)? Dizziness, headache  3. What is your BP issue? Pt wanted to know if there is another type of BP issue that she needs to take

## 2020-02-04 MED ORDER — AMLODIPINE BESYLATE 2.5 MG PO TABS
2.5000 mg | ORAL_TABLET | Freq: Every day | ORAL | 3 refills | Status: DC
Start: 2020-02-04 — End: 2020-06-09

## 2020-02-04 NOTE — Telephone Encounter (Signed)
I called and spoke to son- I advised to have patient call back to discuss new medication that I was sending to pharmacy- for her blood pressure.  He advised he would tell her to pick up new medication and to call with any questions.   RX sent to pharmacy

## 2020-02-04 NOTE — Telephone Encounter (Signed)
Any recommendations?

## 2020-02-04 NOTE — Telephone Encounter (Signed)
Patient is following up in request for medication recommendations from Dr. Harrell Gave. Please call.

## 2020-02-04 NOTE — Telephone Encounter (Signed)
Let's start amlodipine 2.5 mg daily and we will see if that helps her BP by her follow up visit. Thanks.

## 2020-02-17 ENCOUNTER — Encounter: Payer: Self-pay | Admitting: Cardiology

## 2020-02-17 ENCOUNTER — Telehealth (INDEPENDENT_AMBULATORY_CARE_PROVIDER_SITE_OTHER): Payer: Medicare Other | Admitting: Cardiology

## 2020-02-17 VITALS — BP 123/84 | HR 70 | Ht <= 58 in | Wt 142.0 lb

## 2020-02-17 DIAGNOSIS — E7800 Pure hypercholesterolemia, unspecified: Secondary | ICD-10-CM | POA: Diagnosis not present

## 2020-02-17 DIAGNOSIS — Z7189 Other specified counseling: Secondary | ICD-10-CM | POA: Diagnosis not present

## 2020-02-17 DIAGNOSIS — I10 Essential (primary) hypertension: Secondary | ICD-10-CM | POA: Diagnosis not present

## 2020-02-17 DIAGNOSIS — Z8679 Personal history of other diseases of the circulatory system: Secondary | ICD-10-CM | POA: Diagnosis not present

## 2020-02-17 NOTE — Progress Notes (Signed)
Virtual Visit via Telephone Note   This visit type was conducted due to national recommendations for restrictions regarding the COVID-19 Pandemic (e.g. social distancing) in an effort to limit this patient's exposure and mitigate transmission in our community.  Due to her co-morbid illnesses, this patient is at least at moderate risk for complications without adequate follow up.  This format is felt to be most appropriate for this patient at this time.  The patient did not have access to video technology/had technical difficulties with video requiring transitioning to audio format only (telephone).  All issues noted in this document were discussed and addressed.  No physical exam could be performed with this format.  Please refer to the patient's chart for her  consent to telehealth for [facility].   The patient was identified using 2 identifiers.  Patient Location: Home Provider Location: Home Office  Date:  02/17/2020   ID:  Stephanie Bray, DOB [birth year], MRN [id number]  PCP:  [facility]  Cardiologist:  Buford Dresser, MD PhD  Referring MD: [facility]*   CC: follow up  History of Present Illness:    Stephanie Bray is a 59 y.o. female with a hx of hypertension, hyperlipidemia, diabetes, TIA, VSD who is seen in follow up.   Cardiac history: admitted 11/2018 with chest pain/epistaxis. Had [facility] with normal (though tortuous) coronaries. Had moderate PH and severe LV dysfunction consistent with takotsubo cardiomyopathy. Her pulmonary valve gradient on pullback during RHC was 15 mmHg. No significant shunt by Qp/Qs.   She had a repeat echo 3 mos later, which showed improvement of her LVEF to 50-55%. She again had a perimembranous VSD noted. She was noted to have moderate anterior leaflet mitral valve prolapse without significant regurgitation. RV size and function was normal. Pulmonic valve gradient noted to be elevated from 11 to 21  mmHg compared to prior.  Today: Doing much better since last visit. BP much better controlled on 2.5 mg amlodipine. No major issues, makes her a little tired but that is manageable. No dizziness, lightheadedness, syncope. Had her second dose of Covid without issue.  Denies chest pain, shortness of breath at rest or with normal exertion. No PND, orthopnea, LE edema or unexpected weight gain. No syncope or palpitations.  Past Medical History:  Diagnosis Date  . Anxiety   . Arthritis    "left hip" (04/25/2014)  . Chest pain    a. 04/2014 MV: small anteroapical perfusion defect - likely breast attenuation->low risk.  . Chronic bronchitis ([place])    "get it mostly q yr" (04/25/2014)  . Depression   . Essential hypertension   . GERD (gastroesophageal reflux disease)   . Heart murmur   . High cholesterol   . Iron deficiency anemia   . Migraine    "weekly, at least" (04/25/2014)  . VSD (ventricular septal defect)    a. 04/2014 Echo: EF 55-60%, PASP 66mmHg, small restrictive either supracristal or perimembranous VSD w/o PAH.    Past Surgical History:  Procedure Laterality Date  . CHOLECYSTECTOMY    . RIGHT/LEFT HEART CATH AND CORONARY ANGIOGRAPHY N/A 12/01/2017   Procedure: RIGHT/LEFT HEART CATH AND CORONARY ANGIOGRAPHY;  Surgeon: Leonie Man, MD;  Location: [place] [facility];  Service: Cardiovascular;  Laterality: N/A;  . VSD REPAIR  ~ 1963   [facility]/notes 04/25/2014    Current Medications: Current Outpatient Medications on File Prior to Visit  Medication Sig  . amLODipine (NORVASC) 2.5 MG tablet Take  1 tablet (2.5 mg total) by mouth daily.  . busPIRone (BUSPAR) 15 MG tablet Take 15 mg by mouth 2 (two) times daily.   . cetirizine (ZYRTEC) 10 MG tablet Take 10 mg by mouth daily.  . cholecalciferol (VITAMIN D3) 25 MCG (1000 UT) tablet Take 1,000 Units by mouth daily.  . Ferrous Sulfate (IRON) 325 (65 Fe) MG TABS Take 325 mg by mouth every other day.   . metFORMIN (GLUCOPHAGE-XR)  500 MG 24 hr tablet Take 500 mg by mouth daily with breakfast.   . nitroGLYCERIN (NITROSTAT) 0.4 MG SL tablet Place 0.4 mg under the tongue every 5 (five) minutes as needed for chest pain.   . Omega-3 Fatty Acids (FISH OIL) 1000 MG CAPS Take 1,000 mg by mouth daily.  Marland Kitchen omeprazole (PRILOSEC) 20 MG capsule Take 20 mg by mouth daily.   . simvastatin (ZOCOR) 20 MG tablet Take 20 mg by mouth at bedtime.  . traZODone (DESYREL) 100 MG tablet Take 300 mg by mouth at bedtime.    No current facility-administered medications on file prior to visit.     Allergies:   Flonase [fluticasone propionate], Other, and Ace inhibitors   Social History   Tobacco Use  . Smoking status: Never Smoker  . Smokeless tobacco: Never Used  Substance Use Topics  . Alcohol use: No  . Drug use: No    Family History: The patient's family history includes COPD in her mother; Hyperlipidemia in her father; Hypertension in her father.  ROS:   Please see the history of present illness.  Additional pertinent ROS otherwise unremarkable.  EKGs/Labs/Other Studies Reviewed:    The following studies were personally reviewed today:  Echo 02/17/18 Study Conclusions  - Left ventricle: The cavity size was normal. Systolic function was   normal. The estimated ejection fraction was in the range of 50%   to 55%. Wall motion was normal; there were no regional wall   motion abnormalities. Doppler parameters are consistent with   abnormal left ventricular relaxation (grade 1 diastolic   dysfunction). Doppler parameters are consistent with high   ventricular filling pressure. - Ventricular septum: There was a defect in the perimembranous   region. - Aortic valve: Transvalvular velocity was within the normal range.   There was no stenosis. There was no regurgitation. - Mitral valve: Moderate prolapse, involving the anterior leaflet.   Transvalvular velocity was within the normal range. There was no   evidence for stenosis.  There was no regurgitation. - Right ventricle: The cavity size was normal. Wall thickness was   normal. Systolic function was normal. - Atrial septum: No defect or patent foramen ovale was identified. - Tricuspid valve: There was mild regurgitation. - Pulmonic valve: The findings are consistent with moderate   stenosis. Peak gradient (S): 38 mm Hg. - Pulmonary arteries: Systolic pressure was within the normal   range.  Impressions:  - Compared with the echo 11/2017, the mean pulmonic valve gradient   has increased from 11 mmHg to 21 mmHg.  Echocardiogram 11/30/2017 Left ventricle: There is evidence of perimembranous or supracristal VSD by colorflow Doppler with peak gradient of 21mmHg. The cavity size was mildly dilated. Systolic function was moderately reduced. The estimated ejection fraction was in the range of 35% to 40%. There is akinesis of the apical anterior, apical septal, lateral, inferolateral, inferior, and apical myocardium and suggestive of stress induced cardiomyopathy. Features are consistent with a pseudonormal left ventricular filling pattern, with concomitant abnormal relaxation and increased filling pressure (grade  2 diastolic dysfunction). Doppler parameters are consistent with high ventricular filling pressure. - Mitral valve: Calcified annulus. Mild, late systolicsystolic bowing without prolapse, involving the anterior leaflet. - Left atrium: The atrium was moderately dilated. - Atrial septum: There was increased thickness of the septum, consistent with lipomatous hypertrophy. - Tricuspid valve: There was trivial regurgitation. - Pulmonic valve: Pulmonic valve VTI 44.1 cm and peak velocity 235cm/sec with mean gradient of 82mmHg consistent with moderate pulmonic stenosis. The findings are consistent with moderate stenosis. - Pulmonary arteries: Systolic pressure could not be accurately estimated.  Impressions:  -  Compared to prior echo, there is now akinesis of the apical segments of the LV suggestive of stress cardiomyopathy with EF 35-40% with grade 2 DD. There is a perimembranous or suprcristal VSD with a peak gradient of 8mmHg. Cannot accurately assess PASP. There is evidence of pulmonic stenosis but the mean gradient has decreased from 92mmHg to 48mmHg compared to priorLeft ventricle: There is evidence of perimembranous or supracristal VSD by colorflow Doppler with peak gradient of 38mmHg. The cavity size was mildly dilated. Systolic function was moderately reduced. The estimated ejection fraction was in the range of 35% to 40%. There is akinesis of the apical anterior, apical septal, lateral, inferolateral, inferior, and apical myocardium and suggestive of stress induced cardiomyopathy. Features are consistent with a pseudonormal left ventricular filling pattern, with concomitant abnormal relaxation and increased filling pressure (grade 2 diastolic dysfunction). Doppler parameters are consistent with high ventricular filling pressure. - Mitral valve: Calcified annulus. Mild, late systolicsystolic bowing without prolapse, involving the anterior leaflet. - Left atrium: The atrium was moderately dilated. - Atrial septum: There was increased thickness of the septum, consistent with lipomatous hypertrophy. - Tricuspid valve: There was trivial regurgitation. - Pulmonic valve: Pulmonic valve VTI 44.1 cm and peak velocity 235cm/sec with mean gradient of 10mmHg consistent with moderate pulmonic stenosis. The findings are consistent with moderate stenosis. - Pulmonary arteries: Systolic pressure could not be accurately estimated.  Impressions:  - Compared to prior echo, there is now akinesis of the apical segments of the LV suggestive of stress cardiomyopathy with EF 35-40% with grade 2 DD. There is a perimembranous or suprcristal VSD with a  peak gradient of 17mmHg. Cannot accurately assess PASP. There is evidence of pulmonic stenosis but the mean gradient has decreased from 43mmHg to 43mmHg compared to prior   Cardiac Cath 12/01/2017 Conclusion     Hemodynamic findings consistent with moderate pulmonary hypertension.  Angiographically normal coronary arteries, somewhat tortuous  There is mild pulmonic valve stenosis.  LV end diastolic pressure is moderately elevated.  There is severe left ventricular systolic dysfunction.  The left ventricular ejection fraction is 25-35% by visual estimate.  There is trivial (1+) mitral regurgitation.  There is no aortic valve stenosis.  There is mild mitral valve prolapse.   Angiographically normal coronary arteries.  Severely reduced LVEF of roughly 25 to 30% with global hypokinesis (worse in the anterior and apical walls) -consistent with Takotsubo/stress-induced cardiomyopathy  Mild mitral prolapse noted but no significant MR.  Pulmonary valve gradient of roughly 15 mmHg on pullback.  Cardiac output/index by Fick: 4.18, 2.52. Thermodilution 6.22, 3.74  No evidence of shunt by Qp/Qs=1; significant mixing of blood from IVC, coronary sinus and SVC (IVC sat 76%, SVC sat 66%, RA sat 64% -> RV sat 74%, PA sat 75%)     EKG:  EKG is personally reviewed.  The ekg ordered 01/12/20 demonstrates sinus bradycardia at 59 bpm, RBBB, LPFB, st degree AV  block  Recent Labs: 01/12/2020: ALT 13; BUN 11; Creatinine, Ser 0.65; Hemoglobin 10.9; Platelets 267; Potassium 3.9; Sodium 139  Recent Lipid Panel    Component Value Date/Time   CHOL 169 11/30/2017 0332   TRIG 37 11/30/2017 0332   HDL 57 11/30/2017 0332   CHOLHDL 3.0 11/30/2017 0332   VLDL 7 11/30/2017 0332   LDLCALC 105 (H) 11/30/2017 0332    Physical Exam:    VS:  BP 123/84 Comment: Pt obtained last night 02/16/2020  Pulse 70   Ht 4\' 10"  (1.473 m)   Wt 142 lb (64.4 kg)   SpO2 98%   BMI 29.68 kg/m     Wt  Readings from Last 3 Encounters:  02/17/20 142 lb (64.4 kg)  01/24/20 141 lb (64 kg)  01/12/20 158 lb 11.7 oz (72 kg)    Speaking comfortably on the phone, no audible wheezing In no acute distress Alert and oriented Normal affect Normal speech  ASSESSMENT:    1. Essential hypertension   2. Pure hypercholesterolemia   3. History of cardiomyopathy   4. Cardiac risk counseling   5. Counseling on health promotion and disease prevention    PLAN:    Headaches, dizziness, episode of syncope: resolved with stopping metoprolol. Has had her second Covid vaccine without incidents  Hypertension: -felt poorly on metoprolol. Has had a cough on ACEi -doing well on 2.5 mg amlodipine, continue  Hypercholesterolemia: with TIA history, ideally LDL goal <70 -tolerating simvastatin. Recheck lipids at follow up. May need to consider changing statin in the future, especially if amlodipine needs to be uptitrated  Takotsubo cardiomyopathy: resolved, EF improved -did not tolerate beta blocker  VSD, mitral valve prolapse, mild pulmonic stenosis: no intervention required, asymptomatic -VSD: no significant shunt, no evidence of volume overload on echo -mitral valve prolapse: no significant regurgitation -pulmonic stenosis: 15 mmHg on pullback, between 11 and 21 mmHg mean on echo.   Abnormal ECG: RBBB, LPFB, first degree AV block.  Prevention counseling: Secondary prevention given TIA history, NSTEMI (though normal cors) -recommend heart healthy/Mediterranean diet, with whole grains, fruits, vegetable, fish, lean meats, nuts, and olive oil. Limit salt. -recommend moderate walking, 3-5 times/week for 30-50 minutes each session. Aim for at least 150 minutes.week. Goal should be pace of 3 miles/hours, or walking 1.5 miles in 30 minutes -recommend avoidance of tobacco products. Avoid excess alcohol. -we have discussed aspirin in the past. Given syncope, this is on hole. Reassess at follow up if no further  events  Plan for follow up: 3 mos  Today, I have spent 6 minutes with the patient with telehealth technology discussing the above problems.  Additional time spent in chart review, documentation, and communication.  Medication Adjustments/Labs and Tests Ordered: Current medicines are reviewed at length with the patient today.  Concerns regarding medicines are outlined above.  No orders of the defined types were placed in this encounter.  No orders of the defined types were placed in this encounter.   Patient Instructions  Medication Instructions:  Your Physician recommend you continue on your current medication as directed.    *If you need a refill on your cardiac medications before your next appointment, please call your pharmacy*   Lab Work: None ordered   Testing/Procedures: None ordered   Follow-Up: At [facility], you and your health needs are our priority.  As part of our continuing mission to provide you with exceptional heart care, we have created designated Provider Care Teams.  These Care Teams include your  primary Cardiologist (physician) and Advanced Practice Providers (APPs -  Physician Assistants and Nurse Practitioners) who all work together to provide you with the care you need, when you need it.  We recommend signing up for the patient portal called "MyChart".  Sign up information is provided on this After Visit Summary.  MyChart is used to connect with patients for Virtual Visits (Telemedicine).  Patients are able to view lab/test results, encounter notes, upcoming appointments, etc.  Non-urgent messages can be sent to your provider as well.   To learn more about what you can do with MyChart, go to [email or web address].    Your next appointment:   3 month(s)  The format for your next appointment:   In Person  Provider:   Buford Dresser, MD       Signed, Buford Dresser, MD PhD 02/17/2020 1:17 PM    [place]

## 2020-02-17 NOTE — Patient Instructions (Signed)
Medication Instructions:  Your Physician recommend you continue on your current medication as directed.    *If you need a refill on your cardiac medications before your next appointment, please call your pharmacy*   Lab Work: None ordered   Testing/Procedures: None ordered    Follow-Up: At [facility], you and your health needs are our priority.  As part of our continuing mission to provide you with exceptional heart care, we have created designated Provider Care Teams.  These Care Teams include your primary Cardiologist (physician) and Advanced Practice Providers (APPs -  Physician Assistants and Nurse Practitioners) who all work together to provide you with the care you need, when you need it.  We recommend signing up for the patient portal called "MyChart".  Sign up information is provided on this After Visit Summary.  MyChart is used to connect with patients for Virtual Visits (Telemedicine).  Patients are able to view lab/test results, encounter notes, upcoming appointments, etc.  Non-urgent messages can be sent to your provider as well.   To learn more about what you can do with MyChart, go to [email or web address].    Your next appointment:   3 month(s)  The format for your next appointment:   In Person  Provider:   Bridgette Christopher, MD    

## 2020-05-03 ENCOUNTER — Telehealth: Payer: Self-pay

## 2020-05-03 ENCOUNTER — Telehealth: Payer: Self-pay | Admitting: Cardiology

## 2020-05-03 NOTE — Telephone Encounter (Signed)
Spoke to the patients son in regards to her recent refill request for amlodipine and explained that she has a year script and she can call her local pharmacy for a refill request.

## 2020-05-03 NOTE — Telephone Encounter (Signed)
    *  STAT* If patient is at the pharmacy, call can be transferred to refill team.   1. Which medications need to be refilled? (please list name of each medication and dose if known) amLODipine (NORVASC) 2.5 MG tablet  2. Which pharmacy/location (including street and city if local pharmacy) is medication to be sent to? [facility] - [place], [place] - [address]  3. Do they need a 30 day or 90 day supply? 90 days

## 2020-05-19 ENCOUNTER — Ambulatory Visit: Payer: Medicare Other | Admitting: Cardiology

## 2020-06-09 ENCOUNTER — Other Ambulatory Visit: Payer: Self-pay

## 2020-06-09 ENCOUNTER — Encounter: Payer: Self-pay | Admitting: Cardiology

## 2020-06-09 ENCOUNTER — Ambulatory Visit (INDEPENDENT_AMBULATORY_CARE_PROVIDER_SITE_OTHER): Payer: Medicare Other | Admitting: Cardiology

## 2020-06-09 VITALS — BP 140/78 | HR 69 | Ht <= 58 in | Wt 153.2 lb

## 2020-06-09 DIAGNOSIS — E119 Type 2 diabetes mellitus without complications: Secondary | ICD-10-CM | POA: Diagnosis not present

## 2020-06-09 DIAGNOSIS — Z8679 Personal history of other diseases of the circulatory system: Secondary | ICD-10-CM

## 2020-06-09 DIAGNOSIS — E7800 Pure hypercholesterolemia, unspecified: Secondary | ICD-10-CM

## 2020-06-09 DIAGNOSIS — I10 Essential (primary) hypertension: Secondary | ICD-10-CM | POA: Diagnosis not present

## 2020-06-09 DIAGNOSIS — Z7189 Other specified counseling: Secondary | ICD-10-CM

## 2020-06-09 DIAGNOSIS — Q210 Ventricular septal defect: Secondary | ICD-10-CM

## 2020-06-09 MED ORDER — METFORMIN HCL ER 500 MG PO TB24: 500.0000 mg | ORAL_TABLET | Freq: Every day | ORAL | 0 refills | Status: DC

## 2020-06-09 MED ORDER — AMLODIPINE BESYLATE 2.5 MG PO TABS: 2.5000 mg | ORAL_TABLET | Freq: Every day | ORAL | 3 refills | Status: DC

## 2020-06-09 MED ORDER — NITROGLYCERIN 0.4 MG SL SUBL: 0.4000 mg | SUBLINGUAL_TABLET | SUBLINGUAL | 3 refills | Status: DC | PRN

## 2020-06-09 NOTE — Patient Instructions (Addendum)
Medication Instructions:  Your Physician recommend you continue on your current medication as directed.    *If you need a refill on your cardiac medications before your next appointment, please call your pharmacy*   Lab Work: Your physician recommends lab work today (Lipid).  If you have labs (blood work) drawn today and your tests are completely normal, you will receive your results only by: Marland Kitchen MyChart Message (if you have MyChart) OR . A paper copy in the mail If you have any lab test that is abnormal or we need to change your treatment, we will call you to review the results.   Testing/Procedures: None   Follow-Up: At [facility], you and your health needs are our priority.  As part of our continuing mission to provide you with exceptional heart care, we have created designated Provider Care Teams.  These Care Teams include your primary Cardiologist (physician) and Advanced Practice Providers (APPs -  Physician Assistants and Nurse Practitioners) who all work together to provide you with the care you need, when you need it.  We recommend signing up for the patient portal called "MyChart".  Sign up information is provided on this After Visit Summary.  MyChart is used to connect with patients for Virtual Visits (Telemedicine).  Patients are able to view lab/test results, encounter notes, upcoming appointments, etc.  Non-urgent messages can be sent to your provider as well.   To learn more about what you can do with MyChart, go to [email or web address].    Your next appointment:   6 month(s)  The format for your next appointment:   In Person  Provider:   Buford Dresser, MD     [place] refers to food and lifestyle choices that are based on the traditions of countries located on the [place]. This way of eating has been shown to help prevent certain conditions and improve outcomes for people who have chronic diseases, like kidney  disease and heart disease. What are tips for following this plan? Lifestyle  Cook and eat meals together with your family, when possible.  Drink enough fluid to keep your urine clear or pale yellow.  Be physically active every day. This includes: ? Aerobic exercise like running or swimming. ? Leisure activities like gardening, walking, or housework.  Get 7-8 hours of sleep each night.  If recommended by your health care provider, drink red wine in moderation. This means 1 glass a day for nonpregnant women and 2 glasses a day for men. A glass of wine equals 5 oz (150 mL). Reading food labels  Check the serving size of packaged foods. For foods such as rice and pasta, the serving size refers to the amount of cooked product, not dry.  Check the total fat in packaged foods. Avoid foods that have saturated fat or trans fats.  Check the ingredients list for added sugars, such as corn syrup.   Shopping  At the grocery store, buy most of your food from the areas near the walls of the store. This includes: ? Fresh fruits and vegetables (produce). ? Grains, beans, nuts, and seeds. Some of these may be available in unpackaged forms or large amounts (in bulk). ? Fresh seafood. ? Poultry and eggs. ? Low-fat dairy products.  Buy whole ingredients instead of prepackaged foods.  Buy fresh fruits and vegetables in-season from local farmers markets.  Buy frozen fruits and vegetables in resealable bags.  If you do not have access to quality fresh seafood, buy precooked  frozen shrimp or canned fish, such as tuna, salmon, or sardines.  Buy small amounts of raw or cooked vegetables, salads, or olives from the deli or salad bar at your store.  Stock your pantry so you always have certain foods on hand, such as olive oil, canned tuna, canned tomatoes, rice, pasta, and beans. Cooking  Cook foods with extra-virgin olive oil instead of using butter or other vegetable oils.  Have meat as a side  dish, and have vegetables or grains as your main dish. This means having meat in small portions or adding small amounts of meat to foods like pasta or stew.  Use beans or vegetables instead of meat in common dishes like chili or lasagna.  Experiment with different cooking methods. Try roasting or broiling vegetables instead of steaming or sauteing them.  Add frozen vegetables to soups, stews, pasta, or rice.  Add nuts or seeds for added healthy fat at each meal. You can add these to yogurt, salads, or vegetable dishes.  Marinate fish or vegetables using olive oil, lemon juice, garlic, and fresh herbs. Meal planning  Plan to eat 1 vegetarian meal one day each week. Try to work up to 2 vegetarian meals, if possible.  Eat seafood 2 or more times a week.  Have healthy snacks readily available, such as: ? Vegetable sticks with hummus. ? [place] yogurt. ? Fruit and nut trail mix.  Eat balanced meals throughout the week. This includes: ? Fruit: 2-3 servings a day ? Vegetables: 4-5 servings a day ? Low-fat dairy: 2 servings a day ? Fish, poultry, or lean meat: 1 serving a day ? Beans and legumes: 2 or more servings a week ? Nuts and seeds: 1-2 servings a day ? Whole grains: 6-8 servings a day ? Extra-virgin olive oil: 3-4 servings a day  Limit red meat and sweets to only a few servings a month   What are my food choices?  Mediterranean diet ? Recommended  Grains: Whole-grain pasta. Brown rice. Bulgar wheat. Polenta. Couscous. Whole-wheat bread. [place].  Vegetables: Artichokes. Beets. Broccoli. Cabbage. Carrots. Eggplant. Green beans. Chard. Kale. Spinach. Onions. Leeks. Peas. Squash. Tomatoes. Peppers. Radishes.  Fruits: Apples. Apricots. Avocado. Berries. Bananas. Cherries. Dates. Figs. Grapes. Lemons. Melon. Oranges. Peaches. Plums. Pomegranate.  Meats and other protein foods: Beans. Almonds. Sunflower seeds. Pine nuts. Peanuts. [place]. Salmon. Scallops. Shrimp. [place].  Tilapia. Clams. Oysters. Eggs.  Dairy: Low-fat milk. Cheese. Greek yogurt.  Beverages: Water. Red wine. Herbal tea.  Fats and oils: Extra virgin olive oil. Avocado oil. Grape seed oil.  Sweets and desserts: [place] yogurt with honey. Baked apples. Poached pears. Trail mix.  Seasoning and other foods: Basil. Cilantro. Coriander. Cumin. Mint. Parsley. Sage. Rosemary. Tarragon. Garlic. Oregano. Thyme. Pepper. Balsalmic vinegar. Tahini. Hummus. Tomato sauce. Olives. Mushrooms. ? Limit these  Grains: Prepackaged pasta or rice dishes. Prepackaged cereal with added sugar.  Vegetables: Deep fried potatoes (french fries).  Fruits: Fruit canned in syrup.  Meats and other protein foods: Beef. Pork. Lamb. Poultry with skin. Hot dogs. [place] Salines.  Dairy: Ice cream. Sour cream. Whole milk.  Beverages: Juice. Sugar-sweetened soft drinks. Beer. Liquor and spirits.  Fats and oils: Butter. Canola oil. Vegetable oil. Beef fat (tallow). Lard.  Sweets and desserts: Cookies. Cakes. Pies. Candy.  Seasoning and other foods: Mayonnaise. Premade sauces and marinades. The items listed may not be a complete list. Talk with your dietitian about what dietary choices are right for you. Summary  The Mediterranean diet includes both food and lifestyle  choices.  Eat a variety of fresh fruits and vegetables, beans, nuts, seeds, and whole grains.  Limit the amount of red meat and sweets that you eat.  Talk with your health care provider about whether it is safe for you to drink red wine in moderation. This means 1 glass a day for nonpregnant women and 2 glasses a day for men. A glass of wine equals 5 oz (150 mL). This information is not intended to replace advice given to you by your health care provider. Make sure you discuss any questions you have with your health care provider. Document Revised: 12/21/2015 Document Reviewed: 12/14/2015 Elsevier Patient Education  [place].
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
## 2020-06-10 LAB — LIPID PANEL
Chol/HDL Ratio: 2.6 ratio (ref 0.0–4.4)
Cholesterol, Total: 182 mg/dL (ref 100–199)
HDL: 69 mg/dL (ref >39)
LDL Chol Calc (NIH): 93 mg/dL (ref 0–99)
Triglycerides: 111 mg/dL (ref 0–149)
VLDL Cholesterol Cal: 20 mg/dL (ref 5–40)

## 2020-06-28 ENCOUNTER — Other Ambulatory Visit: Payer: Self-pay

## 2020-06-28 ENCOUNTER — Telehealth: Payer: Self-pay | Admitting: Cardiology

## 2020-06-28 DIAGNOSIS — Z79899 Other long term (current) drug therapy: Secondary | ICD-10-CM

## 2020-06-28 MED ORDER — ROSUVASTATIN CALCIUM 20 MG PO TABS: 20.0000 mg | ORAL_TABLET | Freq: Every day | ORAL | 3 refills | Status: DC

## 2020-06-28 NOTE — Telephone Encounter (Signed)
Pt updated with lab results along with MD's recommendations. Pt verbalized understanding. Medication changed from simvastatin to rosuvastatin 20 mg daily and repeat lab orders placed and mailed to pt.

## 2020-06-28 NOTE — Telephone Encounter (Signed)
Follow Up:     Pt said her son said somebody called from this office, she does not know who it was.

## 2021-01-04 ENCOUNTER — Ambulatory Visit (HOSPITAL_BASED_OUTPATIENT_CLINIC_OR_DEPARTMENT_OTHER): Payer: Medicare Other | Admitting: Cardiology

## 2021-03-14 ENCOUNTER — Ambulatory Visit (HOSPITAL_BASED_OUTPATIENT_CLINIC_OR_DEPARTMENT_OTHER): Payer: Medicare Other | Admitting: Cardiology

## 2021-05-08 ENCOUNTER — Other Ambulatory Visit: Payer: Self-pay | Admitting: Cardiology

## 2021-05-08 NOTE — Telephone Encounter (Signed)
Rx(s) sent to pharmacy electronically.  

## 2021-05-14 ENCOUNTER — Other Ambulatory Visit: Payer: Self-pay

## 2021-05-14 ENCOUNTER — Encounter (HOSPITAL_BASED_OUTPATIENT_CLINIC_OR_DEPARTMENT_OTHER): Payer: Self-pay | Admitting: Cardiology

## 2021-05-14 ENCOUNTER — Ambulatory Visit (INDEPENDENT_AMBULATORY_CARE_PROVIDER_SITE_OTHER): Payer: Medicare Other | Admitting: Cardiology

## 2021-05-14 VITALS — BP 100/82 | HR 67 | Ht <= 58 in | Wt 153.5 lb

## 2021-05-14 DIAGNOSIS — E7800 Pure hypercholesterolemia, unspecified: Secondary | ICD-10-CM | POA: Diagnosis not present

## 2021-05-14 DIAGNOSIS — Q210 Ventricular septal defect: Secondary | ICD-10-CM

## 2021-05-14 DIAGNOSIS — Z8679 Personal history of other diseases of the circulatory system: Secondary | ICD-10-CM | POA: Diagnosis not present

## 2021-05-14 DIAGNOSIS — I10 Essential (primary) hypertension: Secondary | ICD-10-CM | POA: Diagnosis not present

## 2021-05-14 DIAGNOSIS — Z7189 Other specified counseling: Secondary | ICD-10-CM

## 2021-05-14 DIAGNOSIS — E119 Type 2 diabetes mellitus without complications: Secondary | ICD-10-CM

## 2021-05-14 NOTE — Progress Notes (Incomplete)
Cardiology Office Note:    Date:  05/14/2021   ID:  Stephanie Bray, DOB Jun 13, 1960, MRN [id number]  PCP:  No primary care provider on file.  Cardiologist:  Buford Dresser, MD  Referring MD: No ref. provider found   CC: follow up  History of Present Illness:    Stephanie Bray is a 61 y.o. female with a hx of hypertension, hyperlipidemia, diabetes, TIA, VSD who is seen in follow up.   Cardiac history: admitted 11/2018 with chest pain/epistaxis. Had Stephanie Bray with normal (though tortuous) coronaries. Had moderate PH and severe LV dysfunction consistent with takotsubo cardiomyopathy. Her pulmonary valve gradient on pullback during RHC was 15 mmHg. No significant shunt by Qp/Qs.   She had a repeat echo 3 mos later, which showed improvement of her LVEF to 50-55%. She again had a perimembranous VSD noted. She was noted to have moderate anterior leaflet mitral valve prolapse without significant regurgitation. RV size and function was normal. Pulmonic valve gradient noted to be elevated from 11 to 21 mmHg compared to prior.  Today: Overall she is feeling pretty good. At home she has not been able to check her blood pressure due to her machine malfunctioning. She is working on obtaining a new one.  She has noticed swelling in her bilateral ankles especially at night. In the mornings she sees some improvement.  Additionally, she reports having hot flashes at times.  Lately, she has been following a low-sugar diet, and eating vegetables and lean meats. She also has low-sugar oatmeal and cheerios. Her weight at home was 153 this morning, and 153.5 in clinic. Prior to the cold weather she was walking regularly.  She has stopped taking metformin due to side effects of nausea.  She denies any palpitations, chest pain, or shortness of breath. No lightheadedness, headaches, syncope, orthopnea, PND, or exertional symptoms.   Past Medical History:  Diagnosis Date   Anxiety    Arthritis    "left hip"  (04/25/2014)   Chest pain    a. 04/2014 MV: small anteroapical perfusion defect - likely breast attenuation->low risk.   Chronic bronchitis ([place])    "get it mostly q yr" (04/25/2014)   Depression    Essential hypertension    GERD (gastroesophageal reflux disease)    Heart murmur    High cholesterol    Iron deficiency anemia    Migraine    "weekly, at least" (04/25/2014)   VSD (ventricular septal defect)    a. 04/2014 Echo: EF 55-60%, PASP 35mmHg, small restrictive either supracristal or perimembranous VSD w/o PAH.    Past Surgical History:  Procedure Laterality Date   CHOLECYSTECTOMY     RIGHT/LEFT HEART CATH AND CORONARY ANGIOGRAPHY N/A 12/01/2017   Procedure: RIGHT/LEFT HEART CATH AND CORONARY ANGIOGRAPHY;  Surgeon: Leonie Man, MD;  Location: [facility];  Service: Cardiovascular;  Laterality: N/A;   VSD REPAIR  ~ 1963   [facility]/notes 04/25/2014    Current Medications: Current Outpatient Medications on File Prior to Visit  Medication Sig   amLODipine (NORVASC) 2.5 MG tablet Take 1 tablet (2.5 mg total) by mouth daily.   cetirizine (ZYRTEC) 10 MG tablet Take 10 mg by mouth daily.   cholecalciferol (VITAMIN D3) 25 MCG (1000 UT) tablet Take 1,000 Units by mouth daily.   Ferrous Sulfate (IRON) 325 (65 Fe) MG TABS Take 325 mg by mouth every other day.    nitroGLYCERIN (NITROSTAT) 0.4 MG SL tablet Place 1 tablet (0.4 mg total) under the tongue every  5 (five) minutes as needed for chest pain.   Omega-3 Fatty Acids (FISH OIL) 1000 MG CAPS Take 1,000 mg by mouth daily.   omeprazole (PRILOSEC) 20 MG capsule Take 20 mg by mouth daily.    rosuvastatin (CRESTOR) 20 MG tablet TAKE ONE TABLET BY MOUTH DAILY   sertraline (ZOLOFT) 50 MG tablet Take 50 mg by mouth daily.   traZODone (DESYREL) 100 MG tablet Take 300 mg by mouth at bedtime.   No current facility-administered medications on file prior to visit.     Allergies:   Flonase [fluticasone propionate], Other, and Ace  inhibitors   Social History   Tobacco Use   Smoking status: Never   Smokeless tobacco: Never  Substance Use Topics   Alcohol use: No   Drug use: No    Family History: The patient's family history includes COPD in her mother; Hyperlipidemia in her father; Hypertension in her father.  ROS:   Please see the history of present illness.   (+) Bilateral ankle edema (+) Hot flashes Additional pertinent ROS otherwise unremarkable.  EKGs/Labs/Other Studies Reviewed:    The following studies were personally reviewed today:  CTA Chest/Abdomen/Pelvis 12/13/2019: FINDINGS: CTA CHEST FINDINGS   Cardiovascular:   --Heart: The heart size is mildly enlarged. There is nopericardial effusion. Overlying median sternotomy wires are present.   --Aorta: There is a right-sided aortic arch present without aneurysmal dilatation. There is mild aortic atherosclerotic calcification. Precontrast images show no aortic intramural hematoma. There is no blood pool, dissection or penetrating ulcer demonstrated on arterial phase postcontrast imaging. There is a conventional 3 vessel aortic arch branching pattern. The proximal arch vessels are widely patent.   --Pulmonary Arteries: Contrast timing is optimized for preferential opacification of the aorta. Within that limitation, normal central pulmonary arteries.   Mediastinum/Nodes: No mediastinal, hilar or axillary lymphadenopathy. The visualized thyroid and thoracic esophageal course are unremarkable.   Lungs/Pleura: No pulmonary nodules or masses. No pleural effusion or pneumothorax. No focal airspace consolidation. No focal pleural abnormality.   Musculoskeletal: No chest wall abnormality. No acute osseous findings.   Review of the MIP images confirms the above findings.   CTA ABDOMEN AND PELVIS FINDINGS   VASCULAR   Aorta: Normal caliber aorta without aneurysm, dissection, vasculitis or hemodynamically significant stenosis. There is  mild aortic atherosclerosis.   Celiac: No aneurysm, dissection or hemodynamically significant stenosis. Normal branching pattern   SMA: Widely patent without dissection or stenosis.   Renals: Single renal arteries bilaterally. No aneurysm, dissection, stenosis or evidence of fibromuscular dysplasia.   IMA: Patent without abnormality.   Inflow: No aneurysm, stenosis or dissection.   Veins: Normal course and caliber of the major veins. Assessment is otherwise limited by the arterial dominant contrast phase.   Review of the MIP images confirms the above findings.   NON-VASCULAR   Hepatobiliary: Normal hepatic contours and density. No visible biliary dilatation. The patient is status post cholecystectomy. No biliary ductal dilation.   Pancreas: Normal contours without ductal dilatation. No peripancreatic fluid collection.   Spleen: Normal arterial phase splenic enhancement pattern.   Adrenals/Urinary Tract:   --Adrenal glands: Normal.   --Right kidney/ureter: No hydronephrosis or perinephric stranding. No nephrolithiasis. No obstructing ureteral stones.   --Left kidney/ureter: No hydronephrosis or perinephric stranding. No nephrolithiasis. No obstructing ureteral stones.   --Urinary bladder: Unremarkable.   Stomach/Bowel:   --Stomach/Duodenum: No hiatal hernia or other gastric abnormality. Normal duodenal course and caliber.   --Small bowel: No dilatation or inflammation.   --Colon:  No focal abnormality. Scattered colonic diverticula are seen.   Lymphatic:  No abdominal or pelvic lymphadenopathy.   Reproductive: No free fluid in the pelvis.   Musculoskeletal. No bony spinal canal stenosis or focal osseous abnormality.   Other: Small fat containing anterior umbilical hernia seen.   Review of the MIP images confirms the above findings.   IMPRESSION: 1. Right-sided aortic arch without evidence of acute aortic abnormality. 2.  Aortic Atherosclerosis  (ICD10-I70.0). 3. No other acute intra-abdominal or pelvic pathology to explain the patient's symptoms.  Echo 02/17/18 Study Conclusions   - Left ventricle: The cavity size was normal. Systolic function was   normal. The estimated ejection fraction was in the range of 50%   to 55%. Wall motion was normal; there were no regional wall   motion abnormalities. Doppler parameters are consistent with   abnormal left ventricular relaxation (grade 1 diastolic   dysfunction). Doppler parameters are consistent with high   ventricular filling pressure. - Ventricular septum: There was a defect in the perimembranous   region. - Aortic valve: Transvalvular velocity was within the normal range.   There was no stenosis. There was no regurgitation. - Mitral valve: Moderate prolapse, involving the anterior leaflet.   Transvalvular velocity was within the normal range. There was no   evidence for stenosis. There was no regurgitation. - Right ventricle: The cavity size was normal. Wall thickness was   normal. Systolic function was normal. - Atrial septum: No defect or patent foramen ovale was identified. - Tricuspid valve: There was mild regurgitation. - Pulmonic valve: The findings are consistent with moderate   stenosis. Peak gradient (S): 38 mm Hg. - Pulmonary arteries: Systolic pressure was within the normal   range.   Impressions:   - Compared with the echo 11/2017, the mean pulmonic valve gradient   has increased from 11 mmHg to 21 mmHg.  Echocardiogram 11/30/2017 Left ventricle: There is evidence of perimembranous or   supracristal VSD by colorflow Doppler with peak gradient of   32mmHg. The cavity size was mildly dilated. Systolic function was   moderately reduced. The estimated ejection fraction was in the   range of 35% to 40%. There is akinesis of the apical anterior,   apical septal, lateral, inferolateral, inferior, and apical   myocardium and suggestive of stress induced  cardiomyopathy.   Features are consistent with a pseudonormal left ventricular   filling pattern, with concomitant abnormal relaxation and   increased filling pressure (grade 2 diastolic dysfunction).   Doppler parameters are consistent with high ventricular filling   pressure. - Mitral valve: Calcified annulus. Mild, late systolicsystolic   bowing without prolapse, involving the anterior leaflet. - Left atrium: The atrium was moderately dilated. - Atrial septum: There was increased thickness of the septum,   consistent with lipomatous hypertrophy. - Tricuspid valve: There was trivial regurgitation. - Pulmonic valve: Pulmonic valve VTI 44.1 cm and peak velocity   235cm/sec with mean gradient of 31mmHg consistent with moderate   pulmonic stenosis. The findings are consistent with moderate   stenosis. - Pulmonary arteries: Systolic pressure could not be accurately   estimated.   Impressions:   - Compared to prior echo, there is now akinesis of the apical   segments of the LV suggestive of stress cardiomyopathy with EF   35-40% with grade 2 DD. There is a perimembranous or suprcristal   VSD with a peak gradient of 32mmHg. Cannot accurately assess   PASP. There is evidence of pulmonic stenosis but  the mean   gradient has decreased from 40mmHg to 73mmHg compared to priorLeft ventricle: There is evidence of perimembranous or   supracristal VSD by colorflow Doppler with peak gradient of   43mmHg. The cavity size was mildly dilated. Systolic function was   moderately reduced. The estimated ejection fraction was in the   range of 35% to 40%. There is akinesis of the apical anterior,   apical septal, lateral, inferolateral, inferior, and apical   myocardium and suggestive of stress induced cardiomyopathy.   Features are consistent with a pseudonormal left ventricular   filling pattern, with concomitant abnormal relaxation and   increased filling pressure (grade 2 diastolic dysfunction).    Doppler parameters are consistent with high ventricular filling   pressure. - Mitral valve: Calcified annulus. Mild, late systolicsystolic   bowing without prolapse, involving the anterior leaflet. - Left atrium: The atrium was moderately dilated. - Atrial septum: There was increased thickness of the septum,   consistent with lipomatous hypertrophy. - Tricuspid valve: There was trivial regurgitation. - Pulmonic valve: Pulmonic valve VTI 44.1 cm and peak velocity   235cm/sec with mean gradient of 64mmHg consistent with moderate   pulmonic stenosis. The findings are consistent with moderate   stenosis. - Pulmonary arteries: Systolic pressure could not be accurately   estimated.   Impressions:   - Compared to prior echo, there is now akinesis of the apical   segments of the LV suggestive of stress cardiomyopathy with EF   35-40% with grade 2 DD. There is a perimembranous or suprcristal   VSD with a peak gradient of 92mmHg. Cannot accurately assess   PASP. There is evidence of pulmonic stenosis but the mean   gradient has decreased from 18mmHg to 53mmHg compared to prior     Cardiac Cath 12/01/2017 Conclusion      Hemodynamic findings consistent with moderate pulmonary hypertension. Angiographically normal coronary arteries, somewhat tortuous There is mild pulmonic valve stenosis. LV end diastolic pressure is moderately elevated. There is severe left ventricular systolic dysfunction. The left ventricular ejection fraction is 25-35% by visual estimate. There is trivial (1+) mitral regurgitation. There is no aortic valve stenosis. There is mild mitral valve prolapse.   Angiographically normal coronary arteries. Severely reduced LVEF of roughly 25 to 30% with global hypokinesis (worse in the anterior and apical walls) -consistent with Takotsubo/stress-induced cardiomyopathy Mild mitral prolapse noted but no significant MR. Pulmonary valve gradient of roughly 15 mmHg on  pullback. Cardiac output/index by Fick: 4.18, 2.52.  Thermodilution 6.22, 3.74 No evidence of shunt by Qp/Qs=1; significant mixing of blood from IVC, coronary sinus and SVC (IVC sat 76%, SVC sat 66%, RA sat 64% -> RV sat 74%, PA sat 75%)   EKG:  EKG is personally reviewed.   05/14/2021: *** 06/09/2020: NSR at 69 bpm, RBBB  Recent Labs: No results found for requested labs within last 8760 hours.   Recent Lipid Panel    Component Value Date/Time   CHOL 182 06/09/2020 1511   TRIG 111 06/09/2020 1511   HDL 69 06/09/2020 1511   CHOLHDL 2.6 06/09/2020 1511   CHOLHDL 3.0 11/30/2017 0332   VLDL 7 11/30/2017 0332   LDLCALC 93 06/09/2020 1511    Physical Exam:    VS:  BP 100/82    Pulse 67    Ht 4\' 10"  (1.473 m)    Wt 153 lb 8 oz (69.6 kg)    SpO2 96%    BMI 32.08 kg/m     Wt Readings  from Last 3 Encounters:  05/14/21 153 lb 8 oz (69.6 kg)  06/09/20 153 lb 3.2 oz (69.5 kg)  02/17/20 142 lb (64.4 kg)    GEN: Well nourished, well developed in no acute distress HEENT: Normal, moist mucous membranes NECK: No JVD CARDIAC: regular rhythm, normal S1 and S2, no rubs or gallops. ***3/6 HS murmur heard bilateral sternum. VASCULAR: Radial and DP pulses 2+ bilaterally. No carotid bruits RESPIRATORY:  Clear to auscultation without rales, wheezing or rhonchi  ABDOMEN: Soft, non-tender, non-distended MUSCULOSKELETAL:  Ambulates independently SKIN: Warm and dry, no edema NEUROLOGIC:  Alert and oriented x 3. No focal neuro deficits noted. PSYCHIATRIC:  Normal affect   ASSESSMENT:    No diagnosis found.  PLAN:    Hypertension: -felt poorly on metoprolol. Has had a cough on ACEi -doing well on 2.5 mg amlodipine, continue -above goal of <130/80 today. Monitor, discussed increasing amlodipine if remains elevated -establishing care with new PCP  Hypercholesterolemia: with TIA history, ideally LDL goal <70 -tolerating simvastatin. May need to consider changing statin in the future, especially if  amlodipine needs to be uptitrated -last LDL 103 06/2019 per [facility] -check lipids today, if above goal would change to rosuvastatin  Takotsubo cardiomyopathy: resolved, EF improved -did not tolerate beta blocker  VSD, mitral valve prolapse, mild pulmonic stenosis: no intervention required, asymptomatic -VSD: no significant shunt, no evidence of volume overload on echo -mitral valve prolapse: no significant regurgitation -pulmonic stenosis: 15 mmHg on pullback, between 11 and 21 mmHg mean on echo.   Abnormal ECG: RBBB, LPFB, first degree AV block.  Type II diabetes, not on insulin: -refill metformin today until she can establish with PCP  Prevention counseling: Secondary prevention given TIA history, NSTEMI (though normal cors) -recommend heart healthy/Mediterranean diet, with whole grains, fruits, vegetable, fish, lean meats, nuts, and olive oil. Limit salt. -recommend moderate walking, 3-5 times/week for 30-50 minutes each session. Aim for at least 150 minutes.week. Goal should be pace of 3 miles/hours, or walking 1.5 miles in 30 minutes -recommend avoidance of tobacco products. Avoid excess alcohol. -we have discussed aspirin in the past. Given syncope, this is on hole. Reassess at follow up if no further events  Plan for follow up: 1 year or sooner as needed.  Medication Adjustments/Labs and Tests Ordered: Current medicines are reviewed at length with the patient today.  Concerns regarding medicines are outlined above.   No orders of the defined types were placed in this encounter.  No orders of the defined types were placed in this encounter.  Patient Instructions  If you have labs done tomorrow, could you please have them fax the results to [place] at ?  If they don't check your cholesterol, we will need to, but we can wait until after these labs.   I,Mathew Stumpf,acting as a Education administrator for [employer], MD.,have documented all relevant documentation on the behalf of Buford Dresser, MD,as directed by  Buford Dresser, MD while in the presence of Buford Dresser, MD.  ***  Signed, Buford Dresser, MD PhD 05/14/2021  [place]

## 2021-05-14 NOTE — Patient Instructions (Signed)
Medication Instructions:  Your Physician recommend you continue on your current medication as directed.    *If you need a refill on your cardiac medications before your next appointment, please call your pharmacy*   Lab Work: If you have labs done tomorrow, could you please have them fax the results to [place] at [phone].  If they don't check your cholesterol, we will need to, but we can wait until after these labs.   Testing/Procedures: None ordered today   Follow-Up: At [facility], you and your health needs are our priority.  As part of our continuing mission to provide you with exceptional heart care, we have created designated Provider Care Teams.  These Care Teams include your primary Cardiologist (physician) and Advanced Practice Providers (APPs -  Physician Assistants and Nurse Practitioners) who all work together to provide you with the care you need, when you need it.  We recommend signing up for the patient portal called "MyChart".  Sign up information is provided on this After Visit Summary.  MyChart is used to connect with patients for Virtual Visits (Telemedicine).  Patients are able to view lab/test results, encounter notes, upcoming appointments, etc.  Non-urgent messages can be sent to your provider as well.   To learn more about what you can do with MyChart, go to [email or web address].    Your next appointment:   1 year(s)  The format for your next appointment:   In Person  Provider:   Buford Dresser, MD

## 2021-05-15 ENCOUNTER — Encounter: Payer: Self-pay | Admitting: Internal Medicine

## 2021-05-15 ENCOUNTER — Ambulatory Visit (INDEPENDENT_AMBULATORY_CARE_PROVIDER_SITE_OTHER): Payer: Medicare Other | Admitting: Internal Medicine

## 2021-05-15 VITALS — BP 154/90 | HR 69 | Ht <= 58 in | Wt 152.2 lb

## 2021-05-15 DIAGNOSIS — Z114 Encounter for screening for human immunodeficiency virus [HIV]: Secondary | ICD-10-CM

## 2021-05-15 DIAGNOSIS — Q221 Congenital pulmonary valve stenosis: Secondary | ICD-10-CM

## 2021-05-15 DIAGNOSIS — I10 Essential (primary) hypertension: Secondary | ICD-10-CM | POA: Diagnosis not present

## 2021-05-15 DIAGNOSIS — Q210 Ventricular septal defect: Secondary | ICD-10-CM

## 2021-05-15 DIAGNOSIS — D509 Iron deficiency anemia, unspecified: Secondary | ICD-10-CM | POA: Diagnosis not present

## 2021-05-15 DIAGNOSIS — E119 Type 2 diabetes mellitus without complications: Secondary | ICD-10-CM

## 2021-05-15 DIAGNOSIS — Z0000 Encounter for general adult medical examination without abnormal findings: Secondary | ICD-10-CM

## 2021-05-15 DIAGNOSIS — Z7689 Persons encountering health services in other specified circumstances: Secondary | ICD-10-CM

## 2021-05-15 DIAGNOSIS — G459 Transient cerebral ischemic attack, unspecified: Secondary | ICD-10-CM

## 2021-05-15 DIAGNOSIS — I341 Nonrheumatic mitral (valve) prolapse: Secondary | ICD-10-CM

## 2021-05-15 DIAGNOSIS — I5181 Takotsubo syndrome: Secondary | ICD-10-CM

## 2021-05-15 DIAGNOSIS — Z124 Encounter for screening for malignant neoplasm of cervix: Secondary | ICD-10-CM

## 2021-05-15 DIAGNOSIS — Z1329 Encounter for screening for other suspected endocrine disorder: Secondary | ICD-10-CM

## 2021-05-15 DIAGNOSIS — E669 Obesity, unspecified: Secondary | ICD-10-CM

## 2021-05-15 DIAGNOSIS — K219 Gastro-esophageal reflux disease without esophagitis: Secondary | ICD-10-CM

## 2021-05-15 MED ORDER — CETIRIZINE HCL 10 MG PO TABS: 10.0000 mg | ORAL_TABLET | Freq: Every day | ORAL | 1 refills | Status: DC

## 2021-05-15 MED ORDER — OMEPRAZOLE 20 MG PO CPDR: 20.0000 mg | DELAYED_RELEASE_CAPSULE | Freq: Every day | ORAL | 1 refills | Status: DC

## 2021-05-15 MED ORDER — IRON 325 (65 FE) MG PO TABS: 325.0000 mg | ORAL_TABLET | ORAL | 1 refills | Status: DC

## 2021-05-15 NOTE — Assessment & Plan Note (Signed)

## 2021-05-15 NOTE — Progress Notes (Signed)
New Patient Office Visit  Subjective:  Patient ID: Stephanie Bray, female    DOB: June 19, 1960  Age: 61 y.o. MRN: [id number]  CC:  Chief Complaint  Patient presents with   New Patient (Initial Visit)    HPI Patient presents for as new pt, patient is a 61 years old female who is known to have TIVA ventricular septal defect also has a cardiomyopathy which has improved.  Patient had a cardiac cath in 2019 and she underwent right and left heart cath, heart of coronary lobe was normal he had moderate pulmonary hypertension and history of left ventricular dysfunction consistent with cardiomyopathy.  However pulmonary valve gradient  was 15 mmHg, patient's left ventricular ejection fraction is 50 to 55%    Past Medical History:  Diagnosis Date   Anxiety    Arthritis    "left hip" (04/25/2014)   Chest pain    a. 04/2014 MV: small anteroapical perfusion defect - likely breast attenuation->low risk.   Chronic bronchitis ([place])    "get it mostly q yr" (04/25/2014)   Depression    Essential hypertension    GERD (gastroesophageal reflux disease)    Heart murmur    High cholesterol    Iron deficiency anemia    Migraine    "weekly, at least" (04/25/2014)   VSD (ventricular septal defect)    a. 04/2014 Echo: EF 55-60%, PASP 43mmHg, small restrictive either supracristal or perimembranous VSD w/o PAH.     Current Outpatient Medications:    amLODipine (NORVASC) 2.5 MG tablet, Take 1 tablet (2.5 mg total) by mouth daily., Disp: 90 tablet, Rfl: 3   cholecalciferol (VITAMIN D3) 25 MCG (1000 UT) tablet, Take 1,000 Units by mouth daily., Disp: , Rfl:    Omega-3 Fatty Acids (FISH OIL) 1000 MG CAPS, Take 1,000 mg by mouth daily., Disp: , Rfl:    rosuvastatin (CRESTOR) 20 MG tablet, TAKE ONE TABLET BY MOUTH DAILY, Disp: 90 tablet, Rfl: 0   sertraline (ZOLOFT) 50 MG tablet, Take 50 mg by mouth daily., Disp: , Rfl:    traZODone (DESYREL) 100 MG tablet, Take 300 mg by mouth at bedtime., Disp: , Rfl:     cetirizine (ZYRTEC) 10 MG tablet, Take 1 tablet (10 mg total) by mouth daily., Disp: 90 tablet, Rfl: 1   Ferrous Sulfate (IRON) 325 (65 Fe) MG TABS, Take 1 tablet (325 mg total) by mouth every other day., Disp: 45 tablet, Rfl: 1   nitroGLYCERIN (NITROSTAT) 0.4 MG SL tablet, Place 1 tablet (0.4 mg total) under the tongue every 5 (five) minutes as needed for chest pain., Disp: 90 tablet, Rfl: 3   omeprazole (PRILOSEC) 20 MG capsule, Take 1 capsule (20 mg total) by mouth daily., Disp: 90 capsule, Rfl: 1   Past Surgical History:  Procedure Laterality Date   CHOLECYSTECTOMY     RIGHT/LEFT HEART CATH AND CORONARY ANGIOGRAPHY N/A 12/01/2017   Procedure: RIGHT/LEFT HEART CATH AND CORONARY ANGIOGRAPHY;  Surgeon: Leonie Man, MD;  Location: [facility];  Service: Cardiovascular;  Laterality: N/A;   VSD REPAIR  ~ 1963   [facility]/notes 04/25/2014    Family History  Problem Relation Age of Onset   COPD Mother    Hypertension Father    Hyperlipidemia Father     Social History   Socioeconomic History   Marital status: Divorced    Spouse name: Not on file   Number of children: Not on file   Years of education: Not on file   Highest  education level: Not on file  Occupational History   Not on file  Tobacco Use   Smoking status: Never   Smokeless tobacco: Never  Substance and Sexual Activity   Alcohol use: No   Drug use: No   Sexual activity: Never  Other Topics Concern   Not on file  Social History Narrative   Lives in [place] with her three sons.    Hobbies: reads and draw    On disability due to anxiety    Social Determinants of Health   Financial Resource Strain: Not on file  Food Insecurity: Not on file  Transportation Needs: Not on file  Physical Activity: Not on file  Stress: Not on file  Social Connections: Not on file  Intimate Partner Violence: Not on file    ROS Review of Systems  Constitutional: Negative.   HENT: Negative.    Eyes: Negative.    Respiratory: Negative.    Cardiovascular: Negative.   Gastrointestinal: Negative.   Endocrine: Negative.   Genitourinary:  Positive for enuresis. Negative for hematuria.  Musculoskeletal: Negative.   Skin: Negative.   Allergic/Immunologic: Negative.   Neurological:  Positive for dizziness.  Hematological: Negative.   Psychiatric/Behavioral: Negative.    All other systems reviewed and are negative.  Objective:   Today's Vitals: BP (!) 154/90    Pulse 69    Ht 4\' 10"  (1.473 m)    Wt 152 lb 3.2 oz (69 kg)    BMI 31.81 kg/m   Physical Exam Constitutional:      Appearance: Normal appearance. She is obese.  HENT:     Nose: Nose normal.     Mouth/Throat:     Mouth: Mucous membranes are moist.  Cardiovascular:     Rate and Rhythm: Normal rate and regular rhythm.     Heart sounds: S1 normal and S2 normal. Murmur heard.  Crescendo decrescendo systolic murmur is present with a grade of 2/6.    No friction rub. No gallop.     Comments: First and second heart sounds are normal there is a grade 2/6 pulmonary stenosis murmur, there is crescendo decrescendo in the substernal area grade 2/6 possible ventricular septal defect Musculoskeletal:     Cervical back: Normal range of motion.     Right lower leg: No edema.     Left lower leg: No edema.  Neurological:     Mental Status: She is alert.    Assessment & Plan:   Problem List Items Addressed This Visit       Cardiovascular and Mediastinum   Paramembranous VSD (Chronic)    Stable      Pulmonic stenosis, congenital (Chronic)    Stable      TIA (transient ischemic attack)    There is no recurrence of TIA      Essential hypertension     Patient denies any chest pain or shortness of breath there is no history of palpitation or paroxysmal nocturnal dyspnea   patient was advised to follow low-salt low-cholesterol diet    ideally I want to keep systolic blood pressure below 130 mmHg, patient was asked to check blood pressure  one times a week and give me a report on that.  Patient will be follow-up in 3 months  or earlier as needed, patient will call me back for any change in the cardiovascular symptoms Patient was advised to buy a book from local bookstore concerning blood pressure and read several chapters  every day.  This will be supplemented  by some of the material we will give him from the office.  Patient should also utilize other resources like YouTube and Internet to learn more about the blood pressure and the diet.      Relevant Orders   CBC with Differential/Platelet   Takotsubo cardiomyopathy    Stable at the present time      Mitral valve prolapse determined by imaging    Stable        Digestive   GERD (gastroesophageal reflux disease)    - The patient's GERD is stable on medication.  - Instructed the patient to avoid eating spicy and acidic foods, as well as foods high in fat. - Instructed the patient to avoid eating large meals or meals 2-3 hours prior to sleeping.      Relevant Medications   omeprazole (PRILOSEC) 20 MG capsule     Endocrine   Diabetes mellitus without complication ([place])    - The patient's blood sugar is labile on med. - The patient will continue the current treatment regimen.  - I encouraged the patient to regularly check blood sugar.  - I encouraged the patient to monitor diet. I encouraged the patient to eat low-carb and low-sugar to help prevent blood sugar spikes.  - I encouraged the patient to continue following their prescribed treatment plan for diabetes - I informed the patient to get help if blood sugar drops below 54mg /dL, or if suddenly have trouble thinking clearly or breathing.  Patient was advised to buy a book on diabetes from a local bookstore or from [place]  Patient should read 2 chapters every day to keep the motivation going, this is in addition to some of the materials we provided them from the office.  There are other resources on the Internet like  YouTube and wilkipedia to get an education on the diabetes      Relevant Orders   Hemoglobin A1c   Microalbumin, urine     Other   Iron deficiency anemia   Relevant Medications   Ferrous Sulfate (IRON) 325 (65 Fe) MG TABS   Other Visit Diagnoses     Pap smear for cervical cancer screening    -  Primary   Relevant Orders   Ambulatory referral to Obstetrics / Gynecology   Establishing care with new doctor, encounter for       Relevant Orders   CBC with Differential/Platelet   COMPLETE METABOLIC PANEL WITH GFR   Hemoglobin A1c   Hepatitis C antibody   HIV Antibody (routine testing w rflx)   Microalbumin, urine   Encounter for screening for HIV       Relevant Orders   HIV Antibody (routine testing w rflx)   Preventative health care       Obesity, unspecified classification, unspecified obesity type, unspecified whether serious comorbidity present       Screening for thyroid disorder       Relevant Orders   TSH       Outpatient Encounter Medications as of 05/15/2021  Medication Sig   amLODipine (NORVASC) 2.5 MG tablet Take 1 tablet (2.5 mg total) by mouth daily.   cholecalciferol (VITAMIN D3) 25 MCG (1000 UT) tablet Take 1,000 Units by mouth daily.   Omega-3 Fatty Acids (FISH OIL) 1000 MG CAPS Take 1,000 mg by mouth daily.   rosuvastatin (CRESTOR) 20 MG tablet TAKE ONE TABLET BY MOUTH DAILY   sertraline (ZOLOFT) 50 MG tablet Take 50 mg by mouth daily.   traZODone (DESYREL) 100 MG  tablet Take 300 mg by mouth at bedtime.   [DISCONTINUED] cetirizine (ZYRTEC) 10 MG tablet Take 10 mg by mouth daily.   [DISCONTINUED] Ferrous Sulfate (IRON) 325 (65 Fe) MG TABS Take 325 mg by mouth every other day.    [DISCONTINUED] omeprazole (PRILOSEC) 20 MG capsule Take 20 mg by mouth daily.    cetirizine (ZYRTEC) 10 MG tablet Take 1 tablet (10 mg total) by mouth daily.   Ferrous Sulfate (IRON) 325 (65 Fe) MG TABS Take 1 tablet (325 mg total) by mouth every other day.   nitroGLYCERIN  (NITROSTAT) 0.4 MG SL tablet Place 1 tablet (0.4 mg total) under the tongue every 5 (five) minutes as needed for chest pain.   omeprazole (PRILOSEC) 20 MG capsule Take 1 capsule (20 mg total) by mouth daily.   No facility-administered encounter medications on file as of 05/15/2021.  Patient is a referred to neurologist for stress incontinence and referred to gynecologist for the Pap smear  Follow-up: No follow-ups on file.   Cletis Athens, MD

## 2021-05-15 NOTE — Assessment & Plan Note (Signed)
Stable at the present time. 

## 2021-05-15 NOTE — Assessment & Plan Note (Signed)
-   The patient's GERD is stable on medication.  - Instructed the patient to avoid eating spicy and acidic foods, as well as foods high in fat. - Instructed the patient to avoid eating large meals or meals 2-3 hours prior to sleeping. 

## 2021-05-15 NOTE — Assessment & Plan Note (Signed)
Stable

## 2021-05-15 NOTE — Assessment & Plan Note (Signed)

## 2021-05-15 NOTE — Assessment & Plan Note (Signed)
There is no recurrence of TIA

## 2021-05-16 ENCOUNTER — Encounter: Payer: Self-pay | Admitting: Obstetrics and Gynecology

## 2021-05-16 LAB — CBC WITH DIFFERENTIAL/PLATELET
Absolute Monocytes: 730 cells/uL (ref 200–950)
Basophils Absolute: 58 cells/uL (ref 0–200)
Basophils Relative: 0.9 %
Eosinophils Absolute: 179 cells/uL (ref 15–500)
Eosinophils Relative: 2.8 %
HCT: 39.4 % (ref 35.0–45.0)
Hemoglobin: 12 g/dL (ref 11.7–15.5)
Lymphs Abs: 2138 cells/uL (ref 850–3900)
MCH: 23.4 pg — ABNORMAL LOW (ref 27.0–33.0)
MCHC: 30.5 g/dL — ABNORMAL LOW (ref 32.0–36.0)
MCV: 76.8 fL — ABNORMAL LOW (ref 80.0–100.0)
MPV: 13 fL — ABNORMAL HIGH (ref 7.5–12.5)
Monocytes Relative: 11.4 %
Neutro Abs: 3296 cells/uL (ref 1500–7800)
Neutrophils Relative %: 51.5 %
Platelets: 294 10*3/uL (ref 140–400)
RBC: 5.13 10*6/uL — ABNORMAL HIGH (ref 3.80–5.10)
RDW: 15.3 % — ABNORMAL HIGH (ref 11.0–15.0)
Total Lymphocyte: 33.4 %
WBC: 6.4 10*3/uL (ref 3.8–10.8)

## 2021-05-16 LAB — COMPLETE METABOLIC PANEL WITH GFR
AG Ratio: 1.9 (calc) (ref 1.0–2.5)
ALT: 12 U/L (ref 6–29)
AST: 19 U/L (ref 10–35)
Albumin: 4.6 g/dL (ref 3.6–5.1)
Alkaline phosphatase (APISO): 62 U/L (ref 37–153)
BUN/Creatinine Ratio: 12 (calc) (ref 6–22)
BUN: 21 mg/dL (ref 7–25)
CO2: 28 mmol/L (ref 20–32)
Calcium: 10.2 mg/dL (ref 8.6–10.4)
Chloride: 108 mmol/L (ref 98–110)
Creat: 1.76 mg/dL — ABNORMAL HIGH (ref 0.50–1.05)
Globulin: 2.4 g/dL (calc) (ref 1.9–3.7)
Glucose, Bld: 122 mg/dL — ABNORMAL HIGH (ref 65–99)
Potassium: 4.9 mmol/L (ref 3.5–5.3)
Sodium: 144 mmol/L (ref 135–146)
Total Bilirubin: 0.5 mg/dL (ref 0.2–1.2)
Total Protein: 7 g/dL (ref 6.1–8.1)
eGFR: 33 mL/min/{1.73_m2} — ABNORMAL LOW (ref >=60)

## 2021-05-16 LAB — HIV ANTIBODY (ROUTINE TESTING W REFLEX): HIV 1&2 Ab, 4th Generation: NONREACTIVE

## 2021-05-16 LAB — MICROALBUMIN, URINE: Microalb, Ur: 1.2 mg/dL

## 2021-05-16 LAB — HEMOGLOBIN A1C
Hgb A1c MFr Bld: 6.1 % of total Hgb — ABNORMAL HIGH (ref <5.7)
Mean Plasma Glucose: 128 mg/dL
eAG (mmol/L): 7.1 mmol/L

## 2021-05-16 LAB — HEPATITIS C ANTIBODY
Hepatitis C Ab: NONREACTIVE
SIGNAL TO CUT-OFF: 0.2 (ref <1.00)

## 2021-05-16 LAB — TSH: TSH: 1.59 mIU/L (ref 0.40–4.50)

## 2021-05-18 ENCOUNTER — Ambulatory Visit (INDEPENDENT_AMBULATORY_CARE_PROVIDER_SITE_OTHER): Payer: Medicare Other

## 2021-05-18 DIAGNOSIS — Z0000 Encounter for general adult medical examination without abnormal findings: Secondary | ICD-10-CM

## 2021-05-18 DIAGNOSIS — Z1211 Encounter for screening for malignant neoplasm of colon: Secondary | ICD-10-CM | POA: Diagnosis not present

## 2021-05-18 DIAGNOSIS — Z1231 Encounter for screening mammogram for malignant neoplasm of breast: Secondary | ICD-10-CM | POA: Diagnosis not present

## 2021-05-18 NOTE — Progress Notes (Signed)
Subjective:   Stephanie Bray is a 61 y.o. female who presents for Medicare Annual (Subsequent) preventive examination. I discussed the limitations of evaluation and management by telemedicine and the availability of in person appointments. The patient expressed understanding and agreed to proceed.   Visit performed by audio   Patient location: Home  Provider location: Home  Review of Systems    N/A       Objective:    There were no vitals filed for this visit. There is no height or weight on file to calculate BMI.  Advanced Directives 05/18/2021 01/12/2020 12/01/2017 11/29/2017 10/30/2015 07/18/2015 09/26/2014  Does Patient Have a Medical Advance Directive? No No - No No No No  Would patient like information on creating a medical advance directive? - No - Patient declined No - Patient declined - No - patient declined information - -    Current Medications (verified) Outpatient Encounter Medications as of 05/18/2021  Medication Sig   amLODipine (NORVASC) 2.5 MG tablet Take 1 tablet (2.5 mg total) by mouth daily.   cetirizine (ZYRTEC) 10 MG tablet Take 1 tablet (10 mg total) by mouth daily.   cholecalciferol (VITAMIN D3) 25 MCG (1000 UT) tablet Take 1,000 Units by mouth daily.   Ferrous Sulfate (IRON) 325 (65 Fe) MG TABS Take 1 tablet (325 mg total) by mouth every other day.   Omega-3 Fatty Acids (FISH OIL) 1000 MG CAPS Take 1,000 mg by mouth daily.   omeprazole (PRILOSEC) 20 MG capsule Take 1 capsule (20 mg total) by mouth daily.   rosuvastatin (CRESTOR) 20 MG tablet TAKE ONE TABLET BY MOUTH DAILY   sertraline (ZOLOFT) 50 MG tablet Take 50 mg by mouth daily.   traZODone (DESYREL) 100 MG tablet Take 300 mg by mouth at bedtime.   nitroGLYCERIN (NITROSTAT) 0.4 MG SL tablet Place 1 tablet (0.4 mg total) under the tongue every 5 (five) minutes as needed for chest pain.   No facility-administered encounter medications on file as of 05/18/2021.    Allergies (verified) Flonase  [fluticasone propionate], Other, and Ace inhibitors   History: Past Medical History:  Diagnosis Date   Anxiety    Arthritis    "left hip" (04/25/2014)   Chest pain    a. 04/2014 MV: small anteroapical perfusion defect - likely breast attenuation->low risk.   Chronic bronchitis ([place])    "get it mostly q yr" (04/25/2014)   Depression    Essential hypertension    GERD (gastroesophageal reflux disease)    Heart murmur    High cholesterol    Iron deficiency anemia    Migraine    "weekly, at least" (04/25/2014)   VSD (ventricular septal defect)    a. 04/2014 Echo: EF 55-60%, PASP 43mmHg, small restrictive either supracristal or perimembranous VSD w/o PAH.   Past Surgical History:  Procedure Laterality Date   CHOLECYSTECTOMY     RIGHT/LEFT HEART CATH AND CORONARY ANGIOGRAPHY N/A 12/01/2017   Procedure: RIGHT/LEFT HEART CATH AND CORONARY ANGIOGRAPHY;  Surgeon: Leonie Man, MD;  Location: [facility];  Service: Cardiovascular;  Laterality: N/A;   VSD REPAIR  ~ 1963   [facility]/notes 04/25/2014   Family History  Problem Relation Age of Onset   COPD Mother    Hypertension Father    Hyperlipidemia Father    Social History   Socioeconomic History   Marital status: Divorced    Spouse name: Not on file   Number of children: Not on file   Years of education: Not  on file   Highest education level: Not on file  Occupational History   Occupation: Disabled  Tobacco Use   Smoking status: Never   Smokeless tobacco: Never  Vaping Use   Vaping Use: Never used  Substance and Sexual Activity   Alcohol use: No   Drug use: No   Sexual activity: Not Currently  Other Topics Concern   Not on file  Social History Narrative   Lives in [place] with her three sons.    Hobbies: reads and draw    On disability due to anxiety    Social Determinants of Health   Financial Resource Strain: High Risk   Difficulty of Paying Living Expenses: Hard  Food Insecurity: No Food Insecurity    Worried About Charity fundraiser in the Last Year: Never true   Ran Out of Food in the Last Year: Never true  Transportation Needs: No Transportation Needs   Lack of Transportation (Medical): No   Lack of Transportation (Non-Medical): No  Physical Activity: Insufficiently Active   Days of Exercise per Week: 2 days   Minutes of Exercise per Session: 20 min  Stress: No Stress Concern Present   Feeling of Stress : Only a little  Social Connections: Moderately Integrated   Frequency of Communication with Friends and Family: Once a week   Frequency of Social Gatherings with Friends and Family: More than three times a week   Attends Religious Services: More than 4 times per year   Active Member of [employer] or Organizations: Yes   Attends Music therapist: More than 4 times per year   Marital Status: Divorced    Tobacco Counseling Counseling given: Not Answered   Clinical Intake:  Pre-visit preparation completed: Yes        Diabetes: No  How often do you need to have someone help you when you read instructions, pamphlets, or other written materials from your doctor or pharmacy?: 2 - Rarely What is the last grade level you completed in school?: 12th grade  Diabetic?Pre diabetic  Interpreter Needed?: No  Information entered by :: Anson [place] CMA   Activities of Daily Living In your present state of health, do you have any difficulty performing the following activities: 05/18/2021  Hearing? N  Vision? N  Walking or climbing stairs? N  Dressing or bathing? N  Doing errands, shopping? N  Preparing Food and eating ? N  Using the Toilet? N  In the past six months, have you accidently leaked urine? Y  Do you have problems with loss of bowel control? Y  Managing your Medications? N  Managing your Finances? N  Housekeeping or managing your Housekeeping? N  Some recent data might be hidden    Patient Care Team: Cletis Athens, MD as PCP - General (Internal  Medicine) Buford Dresser, MD as PCP - Cardiology (Cardiology)  Indicate any recent Medical Services you may have received from other than [facility] providers in the past year (date may be approximate).     Assessment:   This is a routine wellness examination for Stephanie Bray.  Hearing/Vision screen No results found.  Dietary issues and exercise activities discussed: Current Exercise Habits: Home exercise routine, Type of exercise: walking, Time (Minutes): 20, Frequency (Times/Week): 3, Weekly Exercise (Minutes/Week): 60   Goals Addressed   None    Depression Screen PHQ 2/9 Scores 05/18/2021 05/15/2021 07/10/2015 12/20/2014 09/20/2014  PHQ - 2 Score 0 1 0 0 0    Fall Risk Fall Risk  05/18/2021  05/15/2021 12/20/2014 09/20/2014  Falls in the past year? 0 0 No No  Number falls in past yr: 0 0 - -  Injury with Fall? 0 0 - -  Risk for fall due to : No Fall Risks No Fall Risks - -  Follow up Falls evaluation completed Falls evaluation completed - -    FALL RISK PREVENTION PERTAINING TO THE HOME:  Any stairs in or around the home? No  If so, are there any without handrails? No  Home free of loose throw rugs in walkways, pet beds, electrical cords, etc? Yes  Adequate lighting in your home to reduce risk of falls? Yes   ASSISTIVE DEVICES UTILIZED TO PREVENT FALLS:  Life alert? No  Use of a cane, walker or w/c? No  Grab bars in the bathroom? No  Shower chair or bench in shower? No  Elevated toilet seat or a handicapped toilet? No   TIMED UP AND GO:  Was the test performed? No .  Length of time to ambulate 10 feet: 0 sec.     Cognitive Function:        Immunizations Immunization History  Administered Date(s) Administered   Influenza Split 02/03/2014   Influenza Whole 03/05/2006   Influenza, Seasonal, Injecte, Preservative Fre 02/09/2014, 01/17/2015   Influenza,inj,quad, With Preservative 02/05/2016, 01/24/2017, 01/26/2018   Influenza-Unspecified 02/03/2014   Pneumococcal  Polysaccharide-23 07/19/2016   Td 05/06/2002    TDAP status: Up to date  Flu Vaccine status: Due, Education has been provided regarding the importance of this vaccine. Advised may receive this vaccine at local pharmacy or Health Dept. Aware to provide a copy of the vaccination record if obtained from local pharmacy or Health Dept. Verbalized acceptance and understanding.  Pneumococcal vaccine status: Due, Education has been provided regarding the importance of this vaccine. Advised may receive this vaccine at local pharmacy or Health Dept. Aware to provide a copy of the vaccination record if obtained from local pharmacy or Health Dept. Verbalized acceptance and understanding.  Covid-19 vaccine status: Information provided on how to obtain vaccines.   Qualifies for Shingles Vaccine? Yes   Zostavax completed No   Shingrix Completed?: No.    Education has been provided regarding the importance of this vaccine. Patient has been advised to call insurance company to determine out of pocket expense if they have not yet received this vaccine. Advised may also receive vaccine at local pharmacy or Health Dept. Verbalized acceptance and understanding.  Screening Tests Health Maintenance  Topic Date Due   COVID-19 Vaccine (1) Never done   FOOT EXAM  Never done   OPHTHALMOLOGY EXAM  Never done   Zoster Vaccines- Shingrix (1 of 2) Never done   COLONOSCOPY (Pts 45-52yrs Insurance coverage will need to be confirmed)  Never done   MAMMOGRAM  01/20/2011   TETANUS/TDAP  05/06/2012   PAP SMEAR-Modifier  01/03/2015   Pneumococcal Vaccine 60-57 Years old (2 - PCV) 07/19/2017   INFLUENZA VACCINE  12/04/2020   HEMOGLOBIN A1C  11/12/2021   URINE MICROALBUMIN  05/15/2022   Hepatitis C Screening  Completed   HIV Screening  Completed   HPV VACCINES  Aged Out    Health Maintenance  Health Maintenance Due  Topic Date Due   COVID-19 Vaccine (1) Never done   FOOT EXAM  Never done   OPHTHALMOLOGY EXAM   Never done   Zoster Vaccines- Shingrix (1 of 2) Never done   COLONOSCOPY (Pts 45-33yrs Insurance coverage will need to be confirmed)  Never  done   MAMMOGRAM  01/20/2011   TETANUS/TDAP  05/06/2012   PAP SMEAR-Modifier  01/03/2015   Pneumococcal Vaccine 45-65 Years old (2 - PCV) 07/19/2017   INFLUENZA VACCINE  12/04/2020    Colonoscopy Referral ordered  Mammogram status: Ordered 05/18/2021. Pt provided with contact info and advised to call to schedule appt.   Lung Cancer Screening: (Low Dose CT Chest recommended if Age 56-80 years, 30 pack-year currently smoking OR have quit w/in 15years.) does not qualify.   Lung Cancer Screening Referral: No  Additional Screening:  Hepatitis C Screening: does qualify; Completed No  Vision Screening: Recommended annual ophthalmology exams for early detection of glaucoma and other disorders of the eye. Is the patient up to date with their annual eye exam?  No  Who is the provider or what is the name of the office in which the patient attends annual eye exams? [facility] If pt is not established with a provider, would they like to be referred to a provider to establish care? No .   Dental Screening: Recommended annual dental exams for proper oral hygiene  Community Resource Referral / Chronic Care Management: CRR required this visit?  No   CCM required this visit?  No      Plan:     I have personally reviewed and noted the following in the patients chart:   Medical and social history Use of alcohol, tobacco or illicit drugs  Current medications and supplements including opioid prescriptions.  Functional ability and status Nutritional status Physical activity Advanced directives List of other physicians Hospitalizations, surgeries, and ER visits in previous 12 months Vitals Screenings to include cognitive, depression, and falls Referrals and appointments  In addition, I have reviewed and discussed with patient certain preventive  protocols, quality metrics, and best practice recommendations. A written personalized care plan for preventive services as well as general preventive health recommendations were provided to patient.    Ms. Schlotzhauer , Thank you for taking time to come for your Medicare Wellness Visit. I appreciate your ongoing commitment to your health goals. Please review the following plan we discussed and let me know if I can assist you in the future.   These are the goals we discussed:  Goals   None     This is a list of the screening recommended for you and due dates:  Health Maintenance  Topic Date Due   COVID-19 Vaccine (1) Never done   Complete foot exam   Never done   Eye exam for diabetics  Never done   Zoster (Shingles) Vaccine (1 of 2) Never done   Colon Cancer Screening  Never done   Mammogram  01/20/2011   Tetanus Vaccine  05/06/2012   Pap Smear  01/03/2015   Pneumococcal Vaccination (2 - PCV) 07/19/2017   Flu Shot  12/04/2020   Hemoglobin A1C  11/12/2021   Urine Protein Check  05/15/2022   Hepatitis C Screening: USPSTF Recommendation to screen - Ages 18-79 yo.  Completed   HIV Screening  Completed   HPV Vaccine  Aged [address], [place]   05/18/2021   Nurse Notes: Referral was placed for colonoscopy and mammogram. Patient informed of vaccines and how to obtain them. Patient will come in the office in 3 months to have a lipid panel and a1c repeated.

## 2021-05-18 NOTE — Progress Notes (Signed)
I have reviewed this visit and agree with the documentation.   

## 2021-05-21 ENCOUNTER — Other Ambulatory Visit: Payer: Self-pay

## 2021-05-21 DIAGNOSIS — Z1211 Encounter for screening for malignant neoplasm of colon: Secondary | ICD-10-CM

## 2021-05-21 MED ORDER — PEG 3350-KCL-NA BICARB-NACL 420 G PO SOLR: 4000.0000 mL | Freq: Once | ORAL | 0 refills | Status: AC

## 2021-05-21 NOTE — Progress Notes (Signed)
Gastroenterology Pre-Procedure Review  Request Date: 06/12/2021 Requesting Physician: Dr. Allen Norris  PATIENT REVIEW QUESTIONS: The patient responded to the following health history questions as indicated:    1. Are you having any GI issues? no 2. Do you have a personal history of Polyps? no 3. Do you have a family history of Colon Cancer or Polyps? no 4. Diabetes Mellitus?  Pre-diabetic 5. Joint replacements in the past 12 months?no 6. Major health problems in the past 3 months?no 7. Any artificial heart valves, MVP, or defibrillator?no    MEDICATIONS & ALLERGIES:    Patient reports the following regarding taking any anticoagulation/antiplatelet therapy:   Plavix, Coumadin, Eliquis, Xarelto, Lovenox, Pradaxa, Brilinta, or Effient? no Aspirin? no  Patient confirms/reports the following medications:  Current Outpatient Medications  Medication Sig Dispense Refill   amLODipine (NORVASC) 2.5 MG tablet Take 1 tablet (2.5 mg total) by mouth daily. 90 tablet 3   cetirizine (ZYRTEC) 10 MG tablet Take 1 tablet (10 mg total) by mouth daily. 90 tablet 1   cholecalciferol (VITAMIN D3) 25 MCG (1000 UT) tablet Take 1,000 Units by mouth daily.     Ferrous Sulfate (IRON) 325 (65 Fe) MG TABS Take 1 tablet (325 mg total) by mouth every other day. 45 tablet 1   nitroGLYCERIN (NITROSTAT) 0.4 MG SL tablet Place 1 tablet (0.4 mg total) under the tongue every 5 (five) minutes as needed for chest pain. 90 tablet 3   Omega-3 Fatty Acids (FISH OIL) 1000 MG CAPS Take 1,000 mg by mouth daily.     omeprazole (PRILOSEC) 20 MG capsule Take 1 capsule (20 mg total) by mouth daily. 90 capsule 1   rosuvastatin (CRESTOR) 20 MG tablet TAKE ONE TABLET BY MOUTH DAILY 90 tablet 0   sertraline (ZOLOFT) 50 MG tablet Take 50 mg by mouth daily.     traZODone (DESYREL) 100 MG tablet Take 300 mg by mouth at bedtime.     No current facility-administered medications for this visit.    Patient confirms/reports the following  allergies:  Allergies  Allergen Reactions   Flonase [Fluticasone Propionate]     Nose bleed   Other Other (See Comments)    Reaction unknown, per son   Ace Inhibitors     Other reaction(s): Cough    No orders of the defined types were placed in this encounter.   AUTHORIZATION INFORMATION Primary Insurance: 1D#: Group #:  Secondary Insurance: 1D#: Group #:  SCHEDULE INFORMATION: Date: 06/12/2021 Time: Location: [facility]

## 2021-06-11 ENCOUNTER — Other Ambulatory Visit: Payer: Self-pay | Admitting: Cardiology

## 2021-06-11 ENCOUNTER — Encounter: Payer: Self-pay | Admitting: Gastroenterology

## 2021-06-11 DIAGNOSIS — I10 Essential (primary) hypertension: Secondary | ICD-10-CM

## 2021-06-12 ENCOUNTER — Ambulatory Visit: Payer: Medicare Other | Admitting: Anesthesiology

## 2021-06-12 ENCOUNTER — Encounter
Admission: RE | Disposition: A | Discharge status: Home / Self Care | Payer: Self-pay | Source: Home / Self Care | Attending: Gastroenterology

## 2021-06-12 ENCOUNTER — Encounter: Payer: Self-pay | Admitting: Gastroenterology

## 2021-06-12 ENCOUNTER — Ambulatory Visit
Admission: RE | Admit: 2021-06-12 | Discharge: 2021-06-12 | Disposition: A | Discharge status: Home / Self Care | Payer: Medicare Other | Attending: Gastroenterology | Admitting: Gastroenterology

## 2021-06-12 DIAGNOSIS — Z1211 Encounter for screening for malignant neoplasm of colon: Secondary | ICD-10-CM | POA: Insufficient documentation

## 2021-06-12 DIAGNOSIS — K5730 Diverticulosis of large intestine without perforation or abscess without bleeding: Secondary | ICD-10-CM | POA: Diagnosis not present

## 2021-06-12 DIAGNOSIS — K219 Gastro-esophageal reflux disease without esophagitis: Secondary | ICD-10-CM | POA: Diagnosis not present

## 2021-06-12 DIAGNOSIS — I10 Essential (primary) hypertension: Secondary | ICD-10-CM | POA: Insufficient documentation

## 2021-06-12 DIAGNOSIS — E7800 Pure hypercholesterolemia, unspecified: Secondary | ICD-10-CM | POA: Diagnosis not present

## 2021-06-12 DIAGNOSIS — E119 Type 2 diabetes mellitus without complications: Secondary | ICD-10-CM | POA: Insufficient documentation

## 2021-06-12 DIAGNOSIS — Z0000 Encounter for general adult medical examination without abnormal findings: Secondary | ICD-10-CM

## 2021-06-12 DIAGNOSIS — Z79899 Other long term (current) drug therapy: Secondary | ICD-10-CM | POA: Insufficient documentation

## 2021-06-12 DIAGNOSIS — I252 Old myocardial infarction: Secondary | ICD-10-CM | POA: Insufficient documentation

## 2021-06-12 HISTORY — DX: Acute myocardial infarction, unspecified: I21.9

## 2021-06-12 HISTORY — PX: COLONOSCOPY WITH PROPOFOL: SHX5780

## 2021-06-12 HISTORY — DX: Post-traumatic stress disorder, unspecified: F43.10

## 2021-06-12 SURGERY — COLONOSCOPY WITH PROPOFOL
Anesthesia: General

## 2021-06-12 MED ORDER — PROPOFOL 500 MG/50ML IV EMUL
INTRAVENOUS | Status: DC | PRN
  Administered 2021-06-12: 150 ug/kg/min via INTRAVENOUS

## 2021-06-12 MED ORDER — PROPOFOL 10 MG/ML IV BOLUS
INTRAVENOUS | Status: DC | PRN
  Administered 2021-06-12: 50 mg via INTRAVENOUS
  Administered 2021-06-12: 80 mg via INTRAVENOUS
  Administered 2021-06-12: 20 mg via INTRAVENOUS

## 2021-06-12 MED ORDER — SODIUM CHLORIDE 0.9 % IV SOLN: INTRAVENOUS | Status: DC

## 2021-06-12 MED ORDER — LIDOCAINE 2% (20 MG/ML) 5 ML SYRINGE
INTRAMUSCULAR | Status: DC | PRN
  Administered 2021-06-12: 50 mg via INTRAVENOUS

## 2021-06-12 MED ORDER — PROPOFOL 500 MG/50ML IV EMUL
INTRAVENOUS | Status: AC
  Filled 2021-06-12: qty 50

## 2021-06-12 MED ORDER — LIDOCAINE HCL (PF) 2 % IJ SOLN
INTRAMUSCULAR | Status: AC
  Filled 2021-06-12: qty 5

## 2021-06-12 NOTE — Anesthesia Postprocedure Evaluation (Signed)
Anesthesia Post Note  Patient: [name]  Procedure(s) Performed: COLONOSCOPY WITH PROPOFOL  Patient location during evaluation: Endoscopy Anesthesia Type: General Level of consciousness: awake and alert Pain management: pain level controlled Vital Signs Assessment: post-procedure vital signs reviewed and stable Respiratory status: spontaneous breathing, nonlabored ventilation, respiratory function stable and patient connected to nasal cannula oxygen Cardiovascular status: blood pressure returned to baseline and stable Postop Assessment: no apparent nausea or vomiting Anesthetic complications: no   No notable events documented.   Last Vitals:  Vitals:   06/12/21 0821 06/12/21 0908  BP: (!) 171/78 (!) 119/58  Pulse: 78 65  Resp: 18 15  Temp: 36.6 C (!) 36.3 C  SpO2: 97% 98%    Last Pain:  Vitals:   06/12/21 0928  TempSrc:   PainSc: 0-No pain                 Precious Haws Valentino Saavedra

## 2021-06-12 NOTE — Anesthesia Preprocedure Evaluation (Signed)
Anesthesia Evaluation  Patient identified by MRN, date of birth, ID band Patient awake    Reviewed: Allergy & Precautions, NPO status , Patient's Chart, lab work & pertinent test results  History of Anesthesia Complications Negative for: history of anesthetic complications  Airway Mallampati: III  TM Distance: <3 FB Neck ROM: full    Dental  (+) Poor Dentition, Missing   Pulmonary neg pulmonary ROS, neg shortness of breath,    Pulmonary exam normal        Cardiovascular Exercise Tolerance: Good hypertension, (-) angina+ CAD, + Past MI and + Cardiac Stents  Normal cardiovascular exam     Neuro/Psych  Headaches, Seizures -,  PSYCHIATRIC DISORDERS TIA Neuromuscular disease CVA    GI/Hepatic Neg liver ROS, GERD  Controlled,  Endo/Other  diabetes, Type 2  Renal/GU negative Renal ROS  negative genitourinary   Musculoskeletal  (+) Arthritis ,   Abdominal   Peds  Hematology negative hematology ROS (+)   Anesthesia Other Findings Past Medical History: No date: Anxiety No date: Arthritis     Comment:  "left hip" (04/25/2014) No date: Chest pain     Comment:  a. 04/2014 MV: small anteroapical perfusion defect -               likely breast attenuation->low risk. No date: Chronic bronchitis ([place])     Comment:  "get it mostly q yr" (04/25/2014) No date: Depression No date: Essential hypertension No date: GERD (gastroesophageal reflux disease) No date: Heart murmur No date: High cholesterol No date: Iron deficiency anemia No date: Migraine     Comment:  "weekly, at least" (04/25/2014) No date: Myocardial infarction ([place]) No date: PTSD (post-traumatic stress disorder) No date: Stroke [facility]) No date: VSD (ventricular septal defect)     Comment:  a. 04/2014 Echo: EF 55-60%, PASP 35mmHg, small               restrictive either supracristal or perimembranous VSD w/o              PAH.  Past Surgical History: No date:  CHOLECYSTECTOMY 12/01/2017: RIGHT/LEFT HEART CATH AND CORONARY ANGIOGRAPHY; N/A     Comment:  Procedure: RIGHT/LEFT HEART CATH AND CORONARY               ANGIOGRAPHY;  Surgeon: Leonie Man, MD;  Location:               [facility];  Service: Cardiovascular;                Laterality: N/A; ~ 1963: VSD REPAIR     Comment:  [facility]/notes 04/25/2014     Reproductive/Obstetrics negative OB ROS                             Anesthesia Physical Anesthesia Plan  ASA: 3  Anesthesia Plan: General   Post-op Pain Management:    Induction: Intravenous  PONV Risk Score and Plan: Propofol infusion and TIVA  Airway Management Planned: Natural Airway and Nasal Cannula  Additional Equipment:   Intra-op Plan:   Post-operative Plan:   Informed Consent: I have reviewed the patients History and Physical, chart, labs and discussed the procedure including the risks, benefits and alternatives for the proposed anesthesia with the patient or authorized representative who has indicated his/her understanding and acceptance.     Dental Advisory Given  Plan Discussed with: Anesthesiologist, CRNA and Surgeon  Anesthesia Plan Comments: (Patient consented  for risks of anesthesia including but not limited to:  - adverse reactions to medications - risk of airway placement if required - damage to eyes, teeth, lips or other oral mucosa - nerve damage due to positioning  - sore throat or hoarseness - Damage to heart, brain, nerves, lungs, other parts of body or loss of life  Patient voiced understanding.)        Anesthesia Quick Evaluation

## 2021-06-12 NOTE — H&P (Signed)
Stephanie Lame, MD [address] [place], [place] [address] Phone: [phone] Fax : [phone]  Primary Care Physician:  Cletis Athens, MD Primary Gastroenterologist:  Dr. Allen Norris  Pre-Procedure History & Physical: HPI:  Stephanie Bray is a 61 y.o. female is here for a screening colonoscopy.   Past Medical History:  Diagnosis Date   Anxiety    Arthritis    "left hip" (04/25/2014)   Chest pain    a. 04/2014 MV: small anteroapical perfusion defect - likely breast attenuation->low risk.   Chronic bronchitis ([place])    "get it mostly q yr" (04/25/2014)   Depression    Essential hypertension    GERD (gastroesophageal reflux disease)    Heart murmur    High cholesterol    Iron deficiency anemia    Migraine    "weekly, at least" (04/25/2014)   Myocardial infarction [facility])    PTSD (post-traumatic stress disorder)    Stroke [facility])    VSD (ventricular septal defect)    a. 04/2014 Echo: EF 55-60%, PASP 54mmHg, small restrictive either supracristal or perimembranous VSD w/o PAH.    Past Surgical History:  Procedure Laterality Date   CHOLECYSTECTOMY     RIGHT/LEFT HEART CATH AND CORONARY ANGIOGRAPHY N/A 12/01/2017   Procedure: RIGHT/LEFT HEART CATH AND CORONARY ANGIOGRAPHY;  Surgeon: Leonie Man, MD;  Location: [facility];  Service: Cardiovascular;  Laterality: N/A;   VSD REPAIR  ~ 1963   [facility]/notes 04/25/2014    Prior to Admission medications   Medication Sig Start Date End Date Taking? Authorizing Provider  amLODipine (NORVASC) 2.5 MG tablet TAKE 1 TABLET (2.5 MG TOTAL) BY MOUTH DAILY. 06/11/21 06/06/22  Buford Dresser, MD  cetirizine (ZYRTEC) 10 MG tablet Take 1 tablet (10 mg total) by mouth daily. 05/15/21   Cletis Athens, MD  cholecalciferol (VITAMIN D3) 25 MCG (1000 UT) tablet Take 1,000 Units by mouth daily.    [provider]  Ferrous Sulfate (IRON) 325 (65 Fe) MG TABS Take 1 tablet (325 mg total) by mouth every other day. 05/15/21    Cletis Athens, MD  nitroGLYCERIN (NITROSTAT) 0.4 MG SL tablet Place 1 tablet (0.4 mg total) under the tongue every 5 (five) minutes as needed for chest pain. 06/09/20 05/14/21  Buford Dresser, MD  Omega-3 Fatty Acids (FISH OIL) 1000 MG CAPS Take 1,000 mg by mouth daily.    [provider]  omeprazole (PRILOSEC) 20 MG capsule Take 1 capsule (20 mg total) by mouth daily. 05/15/21   Cletis Athens, MD  rosuvastatin (CRESTOR) 20 MG tablet TAKE ONE TABLET BY MOUTH DAILY 05/08/21   Buford Dresser, MD  sertraline (ZOLOFT) 50 MG tablet Take 50 mg by mouth daily. 06/06/20   [provider]  traZODone (DESYREL) 100 MG tablet Take 300 mg by mouth at bedtime.    [provider]    Allergies as of 05/21/2021 - Review Complete 05/18/2021  Allergen Reaction Noted   Flonase [fluticasone propionate]  09/09/2018   Other Other (See Comments) 10/30/2015   Ace inhibitors  10/30/2015    Family History  Problem Relation Age of Onset   COPD Mother    Hypertension Father    Hyperlipidemia Father     Social History   Socioeconomic History   Marital status: Divorced    Spouse name: Not on file   Number of children: Not on file   Years of education: Not on file   Highest education level: Not on file  Occupational History  Occupation: Disabled  Tobacco Use   Smoking status: Never   Smokeless tobacco: Never  Vaping Use   Vaping Use: Never used  Substance and Sexual Activity   Alcohol use: No   Drug use: No   Sexual activity: Not Currently  Other Topics Concern   Not on file  Social History Narrative   Lives in [place] with her three sons.    Hobbies: reads and draw    On disability due to anxiety    Social Determinants of Health   Financial Resource Strain: High Risk   Difficulty of Paying Living Expenses: Hard  Food Insecurity: No Food Insecurity   Worried About Charity fundraiser in the Last Year: Never true   Ran Out of Food in the Last Year: Never  true  Transportation Needs: No Transportation Needs   Lack of Transportation (Medical): No   Lack of Transportation (Non-Medical): No  Physical Activity: Insufficiently Active   Days of Exercise per Week: 2 days   Minutes of Exercise per Session: 20 min  Stress: No Stress Concern Present   Feeling of Stress : Only a little  Social Connections: Moderately Integrated   Frequency of Communication with Friends and Family: Once a week   Frequency of Social Gatherings with Friends and Family: More than three times a week   Attends Religious Services: More than 4 times per year   Active Member of [employer] or Organizations: Yes   Attends Music therapist: More than 4 times per year   Marital Status: Divorced  Human resources officer Violence: Not At Risk   Fear of Current or Ex-Partner: No   Emotionally Abused: No   Physically Abused: No   Sexually Abused: No    Review of Systems: See HPI, otherwise negative ROS  Physical Exam: There were no vitals taken for this visit. General:   Alert,  pleasant and cooperative in NAD Head:  Normocephalic and atraumatic. Neck:  Supple; no masses or thyromegaly. Lungs:  Clear throughout to auscultation.    Heart:  Regular rate and rhythm. Abdomen:  Soft, nontender and nondistended. Normal bowel sounds, without guarding, and without rebound.   Neurologic:  Alert and  oriented x4;  grossly normal neurologically.  Impression/Plan: Stephanie Bray is now here to undergo a screening colonoscopy.  Risks, benefits, and alternatives regarding colonoscopy have been reviewed with the patient.  Questions have been answered.  All parties agreeable.

## 2021-06-12 NOTE — Transfer of Care (Signed)
Immediate Anesthesia Transfer of Care Note  Patient: CARLISE STOFER  Procedure(s) Performed: COLONOSCOPY WITH PROPOFOL  Patient Location: Endoscopy Unit  Anesthesia Type:General  Level of Consciousness: sedated  Airway & Oxygen Therapy: Patient Spontanous Breathing  Post-op Assessment: Report given to RN and Post -op Vital signs reviewed and stable  Post vital signs: Reviewed and stable  Last Vitals:  Vitals Value Taken Time  BP 119/58 06/12/21 0909  Temp 36.3 C 06/12/21 0908  Pulse 63 06/12/21 0909  Resp 14 06/12/21 0909  SpO2 98 % 06/12/21 0909  Vitals shown include unvalidated device data.  Last Pain:  Vitals:   06/12/21 0908  TempSrc: Temporal  PainSc: Asleep         Complications: No notable events documented.

## 2021-06-12 NOTE — Op Note (Signed)
[facility] Gastroenterology Patient Name: Stephanie Bray Procedure Date: 06/12/2021 8:35 AM MRN: [id number] Account #: [id number] Date of Birth: 16-Aug-1960 Admit Type: Outpatient Age: 61 Room: [facility] ENDO ROOM 4 Gender: Female Note Status: Finalized Instrument Name: Park Meo [id number] Procedure:             Colonoscopy Indications:           Screening for colorectal malignant neoplasm Providers:             [name] MD, MD Referring MD:          Cletis Athens, MD (Referring MD) Medicines:             Propofol per Anesthesia Complications:         No immediate complications. Procedure:             Pre-Anesthesia Assessment:                        - Prior to the procedure, a History and Physical was                         performed, and patient medications and allergies were                         reviewed. The patient's tolerance of previous                         anesthesia was also reviewed. The risks and benefits                         of the procedure and the sedation options and risks                         were discussed with the patient. All questions were                         answered, and informed consent was obtained. Prior                         Anticoagulants: The patient has taken no previous                         anticoagulant or antiplatelet agents. ASA Grade                         Assessment: II - A patient with mild systemic disease.                         After reviewing the risks and benefits, the patient                         was deemed in satisfactory condition to undergo the                         procedure.                        After obtaining informed consent, the colonoscope was  passed under direct vision. Throughout the procedure,                         the patient's blood pressure, pulse, and oxygen                         saturations were monitored continuously. The                          Colonoscope was introduced through the anus and                         advanced to the the cecum, identified by appendiceal                         orifice and ileocecal valve. The colonoscopy was                         performed without difficulty. The patient tolerated                         the procedure well. The quality of the bowel                         preparation was excellent. Findings:      The perianal and digital rectal examinations were normal.      Multiple small-mouthed diverticula were found in the sigmoid colon. Impression:            - Diverticulosis in the sigmoid colon.                        - No specimens collected. Recommendation:        - Discharge patient to home.                        - Resume previous diet.                        - Continue present medications.                        - Repeat colonoscopy in 10 years for screening                         purposes. Procedure Code(s):     --- Professional ---                        [phone], Colonoscopy, flexible; diagnostic, including                         collection of specimen(s) by brushing or washing, when                         performed (separate procedure) Diagnosis Code(s):     --- Professional ---                        Z12.11, Encounter for screening for malignant neoplasm  of colon CPT copyright 2019 American Medical [employer]. All rights reserved. The codes documented in this report are preliminary and upon coder review may  be revised to meet current compliance requirements. [name] MD, MD 06/12/2021 9:05:17 AM This report has been signed electronically. Number of Addenda: 0 Note Initiated On: 06/12/2021 8:35 AM Scope Withdrawal Time: 0 hours 8 minutes 39 seconds  Total Procedure Duration: 0 hours 15 minutes 28 seconds  Estimated Blood Loss:  Estimated blood loss: none.      [facility]

## 2021-06-13 ENCOUNTER — Encounter: Payer: Self-pay | Admitting: Gastroenterology

## 2021-06-28 ENCOUNTER — Encounter: Payer: Self-pay | Admitting: Obstetrics and Gynecology

## 2021-06-28 DIAGNOSIS — Z01419 Encounter for gynecological examination (general) (routine) without abnormal findings: Secondary | ICD-10-CM

## 2021-06-28 DIAGNOSIS — Z124 Encounter for screening for malignant neoplasm of cervix: Secondary | ICD-10-CM

## 2021-07-31 ENCOUNTER — Encounter (HOSPITAL_BASED_OUTPATIENT_CLINIC_OR_DEPARTMENT_OTHER): Payer: Self-pay | Admitting: Cardiology

## 2021-08-13 ENCOUNTER — Other Ambulatory Visit: Payer: Self-pay | Admitting: Internal Medicine

## 2021-08-13 ENCOUNTER — Encounter: Payer: Self-pay | Admitting: Internal Medicine

## 2021-08-13 ENCOUNTER — Ambulatory Visit (INDEPENDENT_AMBULATORY_CARE_PROVIDER_SITE_OTHER): Payer: Medicare Other | Admitting: Internal Medicine

## 2021-08-13 VITALS — BP 150/80 | HR 63 | Ht <= 58 in | Wt 156.6 lb

## 2021-08-13 DIAGNOSIS — I10 Essential (primary) hypertension: Secondary | ICD-10-CM | POA: Diagnosis not present

## 2021-08-13 DIAGNOSIS — E119 Type 2 diabetes mellitus without complications: Secondary | ICD-10-CM | POA: Diagnosis not present

## 2021-08-13 DIAGNOSIS — Z0000 Encounter for general adult medical examination without abnormal findings: Secondary | ICD-10-CM | POA: Diagnosis not present

## 2021-08-13 DIAGNOSIS — R42 Dizziness and giddiness: Secondary | ICD-10-CM | POA: Insufficient documentation

## 2021-08-13 DIAGNOSIS — I370 Nonrheumatic pulmonary valve stenosis: Secondary | ICD-10-CM | POA: Diagnosis not present

## 2021-08-13 LAB — GLUCOSE, POCT (MANUAL RESULT ENTRY): POC Glucose: 163 mg/dl — AB (ref 70–99)

## 2021-08-13 MED ORDER — FUROSEMIDE 20 MG PO TABS: 20.0000 mg | ORAL_TABLET | Freq: Every day | ORAL | 3 refills | Status: DC

## 2021-08-13 MED ORDER — FREESTYLE LIBRE 3 SENSOR MISC: 1.0000 | Freq: Two times a day (BID) | 6 refills | Status: DC

## 2021-08-13 MED ORDER — POTASSIUM CHLORIDE CRYS ER 20 MEQ PO TBCR: 20.0000 meq | EXTENDED_RELEASE_TABLET | Freq: Every day | ORAL | 3 refills | Status: DC

## 2021-08-13 NOTE — Assessment & Plan Note (Signed)

## 2021-08-13 NOTE — Progress Notes (Signed)
? ?Established Patient Office Visit ? ?Subjective:  ?Patient ID: Stephanie Bray, female    DOB: [birth year]  Age: 61 y.o. MRN: [id number] ? ?CC:  ?Chief Complaint  ?Patient presents with  ? Dizziness  ?  Patient complains of feet swelling at night time, dizziness, and ringing in her ears and both arm goes numb at times.  ? ? ?Dizziness ?Pertinent negatives include no chest pain, chills or coughing.  ? ?Stephanie Bray presents for check upOkay to computer patient is known to have high blood pressure diabetes exogenous obesity she had a pulmonary stenosis which has been dilated by stent.  She has left with mild pulmonary regurgitation.  She also has a ventricular septal defect on the membranous septum.  The pulmonary valve gradient on the pullback is 15 mm.  Patient has multiple echoes done in the past and she is being followed up by cardiologist in [place].  Right ventricular size is normal and function is normal by echocardiogram.  She also has a problem with anxiety has problems with chronic deep depression bronchitis is known to have essential hypertension reflux problem high cholesterol and she has a history of migraine in the past. ? ?Past Medical History:  ?Diagnosis Date  ? Anxiety   ? Arthritis   ? "left hip" (04/25/2014)  ? Chest pain   ? a. 04/2014 MV: small anteroapical perfusion defect - likely breast attenuation->low risk.  ? Chronic bronchitis ([place])   ? "get it mostly q yr" (04/25/2014)  ? Depression   ? Essential hypertension   ? GERD (gastroesophageal reflux disease)   ? Heart murmur   ? High cholesterol   ? Iron deficiency anemia   ? Migraine   ? "weekly, at least" (04/25/2014)  ? Myocardial infarction [facility])   ? PTSD (post-traumatic stress disorder)   ? Stroke [facility])   ? VSD (ventricular septal defect)   ? a. 04/2014 Echo: EF 55-60%, PASP 15mHg, small restrictive either supracristal or perimembranous VSD w/o PAH.  ? ? ?Past Surgical History:  ?Procedure Laterality Date  ? CHOLECYSTECTOMY    ?  COLONOSCOPY WITH PROPOFOL N/A 06/12/2021  ? Procedure: COLONOSCOPY WITH PROPOFOL;  Surgeon: WLucilla Lame MD;  [facility];  Service: Endoscopy;  Laterality: N/A;  ? RIGHT/LEFT HEART CATH AND CORONARY ANGIOGRAPHY N/A 12/01/2017  ? Procedure: RIGHT/LEFT HEART CATH AND CORONARY ANGIOGRAPHY;  Surgeon: HLeonie Man MD;  [facility];  Service: Cardiovascular;  Laterality: N/A;  ? VSD REPAIR  ~ 1963  ? [facility]/notes 04/25/2014  ? ? ?Family History  ?Problem Relation Age of Onset  ? COPD Mother   ? Hypertension Father   ? Hyperlipidemia Father   ? ? ?Social History  ? ?Socioeconomic History  ? Marital status: Divorced  ?  Spouse name: Not on file  ? Number of children: Not on file  ? Years of education: Not on file  ? Highest education level: Not on file  ?Occupational History  ? Occupation: Disabled  ?Tobacco Use  ? Smoking status: Never  ? Smokeless tobacco: Never  ?Vaping Use  ? Vaping Use: Never used  ?Substance and Sexual Activity  ? Alcohol use: No  ? Drug use: No  ? Sexual activity: Not Currently  ?Other Topics Concern  ? Not on file  ?Social History Narrative  [place]   ? Hobbies: reads and draw   ? On disability due to anxiety   ? ?Social Determinants of Health  ? ?  Financial Resource Strain: High Risk  ? Difficulty of Paying Living Expenses: Hard  ?Food Insecurity: No Food Insecurity  ? Worried About Charity fundraiser in the Last Year: Never true  ? Ran Out of Food in the Last Year: Never true  ?Transportation Needs: No Transportation Needs  ? Lack of Transportation (Medical): No  ? Lack of Transportation (Non-Medical): No  ?Physical Activity: Insufficiently Active  ? Days of Exercise per Week: 2 days  ? Minutes of Exercise per Session: 20 min  ?Stress: No Stress Concern Present  ? Feeling of Stress : Only a little  ?Social Connections: Moderately Integrated  ? Frequency of Communication with Friends and Family: Once a week  ? Frequency of Social  Gatherings with Friends and Family: More than three times a week  ? Attends Religious Services: More than 4 times per year  ? Active Member of Clubs or Organizations: Yes  ? Attends Archivist Meetings: More than 4 times per year  ? Marital Status: Divorced  ?Intimate Partner Violence: Not At Risk  ? Fear of Current or Ex-Partner: No  ? Emotionally Abused: No  ? Physically Abused: No  ? Sexually Abused: No  ? ? ? ?Current Outpatient Medications:  ?  amLODipine (NORVASC) 2.5 MG tablet, TAKE 1 TABLET (2.5 MG TOTAL) BY MOUTH DAILY., Disp: 90 tablet, Rfl: 3 ?  cetirizine (ZYRTEC) 10 MG tablet, Take 1 tablet (10 mg total) by mouth daily., Disp: 90 tablet, Rfl: 1 ?  cholecalciferol (VITAMIN D3) 25 MCG (1000 UT) tablet, Take 1,000 Units by mouth daily., Disp: , Rfl:  ?  Continuous Blood Gluc Sensor (FREESTYLE LIBRE 3 SENSOR) MISC, 1 each by Does not apply route 2 (two) times daily. Place 1 sensor on the skin every 14 days. Use to check glucose continuously. Please included supplies needed, Disp: 2 each, Rfl: 6 ?  Ferrous Sulfate (IRON) 325 (65 Fe) MG TABS, Take 1 tablet (325 mg total) by mouth every other day., Disp: 45 tablet, Rfl: 1 ?  Omega-3 Fatty Acids (FISH OIL) 1000 MG CAPS, Take 1,000 mg by mouth daily., Disp: , Rfl:  ?  omeprazole (PRILOSEC) 20 MG capsule, Take 1 capsule (20 mg total) by mouth daily., Disp: 90 capsule, Rfl: 1 ?  rosuvastatin (CRESTOR) 20 MG tablet, TAKE ONE TABLET BY MOUTH DAILY, Disp: 90 tablet, Rfl: 0 ?  sertraline (ZOLOFT) 50 MG tablet, Take 50 mg by mouth daily., Disp: , Rfl:  ?  traZODone (DESYREL) 100 MG tablet, Take 300 mg by mouth at bedtime., Disp: , Rfl:  ?  nitroGLYCERIN (NITROSTAT) 0.4 MG SL tablet, Place 1 tablet (0.4 mg total) under the tongue every 5 (five) minutes as needed for chest pain., Disp: 90 tablet, Rfl: 3  ? ?Allergies  ?Allergen Reactions  ? Flonase [Fluticasone Propionate]   ?  Nose bleed  ? Other Other (See Comments)  ?  Reaction unknown, per son  ? Ace  Inhibitors   ?  Other reaction(s): Cough  ? ? ?ROS ?Review of Systems  ?Constitutional: Negative.  Negative for chills.  ?HENT: Negative.  Negative for ear discharge and ear pain.   ?Eyes: Negative.  Negative for pain.  ?Respiratory: Negative.  Negative for cough.   ?Cardiovascular:  Positive for leg swelling. Negative for chest pain.  ?Gastrointestinal: Negative.  Negative for abdominal distention.  ?Endocrine: Negative.   ?Genitourinary: Negative.   ?Musculoskeletal: Negative.   ?Skin: Negative.   ?Allergic/Immunologic: Negative.   ?Neurological:  Positive for dizziness.  ?  Hematological: Negative.   ?Psychiatric/Behavioral: Negative.  Negative for behavioral problems.   ?All other systems reviewed and are negative. ? ?  ?Objective:  ?  ?Physical Exam ?Constitutional:   ?   Appearance: She is obese.  ?HENT:  ?   Head: Atraumatic.  ?Cardiovascular:  ?   Rate and Rhythm: Normal rate.  ?   Heart sounds: S1 normal and S2 normal. Murmur heard.  ?Crescendo decrescendo systolic murmur is present with a grade of 3/6.  ?No diastolic murmur is present.  ?Pulmonary:  ?   Effort: No accessory muscle usage or respiratory distress.  ?   Breath sounds: Normal air entry. No decreased air movement.  ?Musculoskeletal:     ?   General: Normal range of motion.  ?   Cervical back: Neck supple.  ?   Right lower leg: 1+ Pitting Edema present.  ?   Left lower leg: 1+ Pitting Edema present.  ?Skin: ?   Coloration: Skin is not jaundiced.  ?Neurological:  ?   General: No focal deficit present.  ? ? ?BP (!) 150/80   Pulse 63   Ht '4\' 10"'$  (1.473 m)   Wt 156 lb 9.6 oz (71 kg)   BMI 32.73 kg/m?  ?Wt Readings from Last 3 Encounters:  ?08/13/21 156 lb 9.6 oz (71 kg)  ?06/12/21 153 lb (69.4 kg)  ?05/15/21 152 lb 3.2 oz (69 kg)  ? ? ? ?Health Maintenance Due  ?Topic Date Due  ? COVID-19 Vaccine (1) Never done  ? FOOT EXAM  Never done  ? OPHTHALMOLOGY EXAM  Never done  ? Zoster Vaccines- Shingrix (1 of 2) Never done  ? MAMMOGRAM  01/20/2011  ?  TETANUS/TDAP  05/06/2012  ? PAP SMEAR-Modifier  01/03/2015  ? ? ?There are no preventive care reminders to display for this patient. ? ?Lab Results  ?Component Value Date  ? TSH 1.59 05/15/2021  ? ?Lab Results  ?Component V

## 2021-08-13 NOTE — Assessment & Plan Note (Signed)
Take Claritin 5 mg p.o. daily 

## 2021-08-13 NOTE — Assessment & Plan Note (Signed)

## 2021-08-13 NOTE — Assessment & Plan Note (Signed)
Patient was referred back to the cardiologist ?

## 2021-08-13 NOTE — Assessment & Plan Note (Signed)
Patient was advised to lose weight, follow low-salt diet, she needs to be checked up every 6 weeks, her care is very complicated so she need to be followed up on a regular basis she has to see her heart doctor every 3 months ?

## 2021-08-16 ENCOUNTER — Encounter: Payer: Self-pay | Admitting: Obstetrics and Gynecology

## 2021-09-13 ENCOUNTER — Telehealth: Payer: Self-pay | Admitting: Cardiology

## 2021-09-13 NOTE — Telephone Encounter (Signed)
Pt c/o of Chest Pain: STAT if CP now or developed within 24 hours ? ?1. Are you having CP right now? No  ? ?2. Are you experiencing any other symptoms (ex. SOB, nausea, vomiting, sweating)? Hypertension  ? ?3. How long have you been experiencing CP? A week  ? ?4. Is your CP continuous or coming and going? Coming and going  ? ?5. Have you taken Nitroglycerin? No  ? ? ?Patient states CP subsides when she takes acid reflux medication. Patient also had elevated BP at her last appt with her PCP, 150-80.  Wanting to discuss increasing amlodipine due to this.   ??  ?

## 2021-09-13 NOTE — Telephone Encounter (Signed)
Pt reports CP/ chest tightness that began a week ago.  Not currently having CP/ tightness.  Reports pain feels like a cramp that is relieved with a belch or sips of coke.  Pt has missed doses of omeprazole.  Most recent episode of discomfort occurred yesterday while watching TV.  Chest discomfort last for a few minutes and subsided on it's own.  Pt has not taken PRN NTG.  Advised pt on usage of NTG for chest pain/ discomfort.  Pt reports BP was elevated at last OV and wants to know if MD will increase amlodipine.  Pt does not monitor BP reports cuff is broken and will eventually obtain a new one.  Advised pt that med change can not be made without BP readings to support need for increased dose.  Denies HA does report intermittent dizziness.  Scheduled pt for DOD OV with Dr. Tamala Julian on 09/14/21 at 3:30 pm.  Gave pt the address and advised to call if needs assistance finding building.  ?

## 2021-09-13 NOTE — Telephone Encounter (Signed)
Left a message to call back.

## 2021-09-13 NOTE — Progress Notes (Signed)
?Cardiology Office Note:   ? ?Date:  09/14/2021  ? ?ID:  Stephanie Bray, DOB 1961/04/18, MRN [id number] ? ?PCP:  Cletis Athens, MD  ?Cardiologist:  Buford Dresser, MD  ? ?Referring MD: Cletis Athens, MD  ? ?Chief Complaint  ?Patient presents with  ? Advice Only  ?  Concerns about elevated blood pressure ?Premature ventricular contractions  ? ? ?History of Present Illness:   ? ?Stephanie Bray is a 61 y.o. female with a hx of hypertension, hyperlipidemia, diabetes, TIA, VSD, who is being seen for "chest pressure".  Prior history of chest pain attributed to ventricular septal defect. ? ?The patient was set up for DOD because of chest discomfort.  States that she called because she was concerned about her blood pressure being elevated and wanted to have an increase in therapy intensity: ?"Pt reports CP/ chest tightness that began a week ago.  Not currently having CP/ tightness.  Reports pain feels like a cramp that is relieved with a belch or sips of coke.  Pt has missed doses of omeprazole.  Most recent episode of discomfort occurred yesterday while watching TV.  Chest discomfort last for a few minutes and subsided on it's own.  Pt has not taken PRN NTG.  Advised pt on usage of NTG for chest pain/ discomfort.  Pt reports BP was elevated at last OV and wants to know if MD will increase amlodipine.  Pt does not monitor BP reports cuff is broken and will eventually obtain a new one.  Advised pt that med change can not be made without BP readings to support need for increased dose.  Denies HA does report intermittent dizziness.  Scheduled pt for DOD OV with Dr. Tamala Julian on 09/14/21 at 3:30 pm.  Gave pt the address and advised to call if needs assistance finding building." ? ? ?States that she called yesterday because she was concerned her blood pressure medication needed to be increased.  The last several times she was in different offices, the systolic was greater than 160 mmHg.  She also mentions that she was having  reflux symptoms that caused her chest to hurt.  This then led to the conversation above when the phone call was answered by the triage nurse.  The patient feels fine today and was not having chest pain yesterday during the conversation. ? ?She has no complaints today. ? ?Past Medical History:  ?Diagnosis Date  ? Anxiety   ? Arthritis   ? "left hip" (04/25/2014)  ? Chest pain   ? a. 04/2014 MV: small anteroapical perfusion defect - likely breast attenuation->low risk.  ? Chronic bronchitis ([place])   ? "get it mostly q yr" (04/25/2014)  ? Depression   ? Essential hypertension   ? GERD (gastroesophageal reflux disease)   ? Heart murmur   ? High cholesterol   ? Iron deficiency anemia   ? Migraine   ? "weekly, at least" (04/25/2014)  ? Myocardial infarction [facility])   ? PTSD (post-traumatic stress disorder)   ? Stroke [facility])   ? VSD (ventricular septal defect)   ? a. 04/2014 Echo: EF 55-60%, PASP 27mHg, small restrictive either supracristal or perimembranous VSD w/o PAH.  ? ? ?Past Surgical History:  ?Procedure Laterality Date  ? CHOLECYSTECTOMY    ? COLONOSCOPY WITH PROPOFOL N/A 06/12/2021  ? Procedure: COLONOSCOPY WITH PROPOFOL;  Surgeon: WLucilla Lame MD;  [facility];  Service: Endoscopy;  Laterality: N/A;  ? RIGHT/LEFT HEART CATH AND CORONARY ANGIOGRAPHY N/A 12/01/2017  ?  Procedure: RIGHT/LEFT HEART CATH AND CORONARY ANGIOGRAPHY;  Surgeon: Leonie Man, MD;  Location: [place] [facility];  Service: Cardiovascular;  Laterality: N/A;  ? VSD REPAIR  ~ 1963  ? [facility]/notes 04/25/2014  ? ? ?Current Medications: ?Current Meds  ?Medication Sig  ? amLODipine (NORVASC) 2.5 MG tablet TAKE 1 TABLET (2.5 MG TOTAL) BY MOUTH DAILY.  ? cetirizine (ZYRTEC) 10 MG tablet Take 1 tablet (10 mg total) by mouth daily.  ? cholecalciferol (VITAMIN D3) 25 MCG (1000 UT) tablet Take 1,000 Units by mouth daily.  ? FEROSUL 325 (65 Fe) MG tablet TAKE ONE TABLET BY MOUTH EVERY OTHER DAY  ? furosemide (LASIX) 20 MG tablet Take 1 tablet (20  mg total) by mouth daily.  ? nitroGLYCERIN (NITROSTAT) 0.4 MG SL tablet Place 1 tablet (0.4 mg total) under the tongue every 5 (five) minutes as needed for chest pain.  ? Omega-3 Fatty Acids (FISH OIL) 1000 MG CAPS Take 1,000 mg by mouth daily.  ? omeprazole (PRILOSEC) 20 MG capsule TAKE ONE CAPSULE BY MOUTH DAILY  ? potassium chloride SA (KLOR-CON M) 20 MEQ tablet Take 1 tablet (20 mEq total) by mouth daily.  ? rosuvastatin (CRESTOR) 20 MG tablet TAKE ONE TABLET BY MOUTH DAILY  ? sertraline (ZOLOFT) 50 MG tablet Take 50 mg by mouth daily.  ? traZODone (DESYREL) 100 MG tablet Take 300 mg by mouth at bedtime.  ?  ? ?Allergies:   Flonase [fluticasone propionate], Other, and Ace inhibitors  ? ?Social History  ? ?Socioeconomic History  ? Marital status: Divorced  ?  Spouse name: Not on file  ? Number of children: Not on file  ? Years of education: Not on file  ? Highest education level: Not on file  ?Occupational History  ? Occupation: Disabled  ?Tobacco Use  ? Smoking status: Never  ? Smokeless tobacco: Never  ?Vaping Use  ? Vaping Use: Never used  ?Substance and Sexual Activity  ? Alcohol use: No  ? Drug use: No  ? Sexual activity: Not Currently  ?Other Topics Concern  ? Not on file  ?Social History Narrative  ? Lives in [place] with her three sons.   ? Hobbies: reads and draw   ? On disability due to anxiety   ? ?Social Determinants of Health  ? ?Financial Resource Strain: High Risk  ? Difficulty of Paying Living Expenses: Hard  ?Food Insecurity: No Food Insecurity  ? Worried About Charity fundraiser in the Last Year: Never true  ? Ran Out of Food in the Last Year: Never true  ?Transportation Needs: No Transportation Needs  ? Lack of Transportation (Medical): No  ? Lack of Transportation (Non-Medical): No  ?Physical Activity: Insufficiently Active  ? Days of Exercise per Week: 2 days  ? Minutes of Exercise per Session: 20 min  ?Stress: No Stress Concern Present  ? Feeling of Stress : Only a little  ?Social  Connections: Moderately Integrated  ? Frequency of Communication with Friends and Family: Once a week  ? Frequency of Social Gatherings with Friends and Family: More than three times a week  ? Attends Religious Services: More than 4 times per year  ? Active Member of Clubs or Organizations: Yes  ? Attends Archivist Meetings: More than 4 times per year  ? Marital Status: Divorced  ?  ? ?Family History: ?The patient's family history includes COPD in her mother; Hyperlipidemia in her father; Hypertension in her father. ? ?ROS:   ?Please see  the history of present illness.    ?She has occasional palpitations.  She gets a spinning feeling in her head at times when she moves suddenly as an going from lying to sitting or sitting to standing.  Has orthopnea and PND.  All other systems reviewed and are negative. ? ?EKGs/Labs/Other Studies Reviewed:   ? ?The following studies were reviewed today: ?Cardiac catheterization 2019 ?Conclusion ? ?  ?Hemodynamic findings consistent with moderate pulmonary hypertension. ?Angiographically normal coronary arteries, somewhat tortuous ?There is mild pulmonic valve stenosis. ?LV end diastolic pressure is moderately elevated. ?There is severe left ventricular systolic dysfunction. ?The left ventricular ejection fraction is 25-35% by visual estimate. ?There is trivial (1+) mitral regurgitation. ?There is no aortic valve stenosis. ?There is mild mitral valve prolapse. ?  ?Angiographically normal coronary arteries. ?Severely reduced LVEF of roughly 25 to 30% with global hypokinesis (worse in the anterior and apical walls) -consistent with Takotsubo/stress-induced cardiomyopathy ?Mild mitral prolapse noted but no significant MR. ?Pulmonary valve gradient of roughly 15 mmHg on pullback. ?Cardiac output/index by Fick: 4.18, 2.52.  Thermodilution 6.22, 3.74 ?No evidence of shunt by Qp/Qs=1; significant mixing of blood from IVC, coronary sinus and SVC (IVC sat 76%, SVC sat 66%, RA sat  64% -> RV sat 74%, PA sat 75%) ? ? ?Diagnostic ?Dominance: Right ? ?2D Doppler echocardiogram from 02/17/2018: ?Study Conclusions  ? ?- Left ventricle: The cavity size was normal. Systolic function was  ?  normal.

## 2021-09-14 ENCOUNTER — Encounter: Payer: Self-pay | Admitting: Interventional Cardiology

## 2021-09-14 ENCOUNTER — Ambulatory Visit (INDEPENDENT_AMBULATORY_CARE_PROVIDER_SITE_OTHER): Payer: Medicare Other | Admitting: Interventional Cardiology

## 2021-09-14 ENCOUNTER — Ambulatory Visit (INDEPENDENT_AMBULATORY_CARE_PROVIDER_SITE_OTHER): Payer: Medicare Other

## 2021-09-14 VITALS — BP 130/84 | HR 76 | Ht <= 58 in | Wt 155.6 lb

## 2021-09-14 DIAGNOSIS — I493 Ventricular premature depolarization: Secondary | ICD-10-CM

## 2021-09-14 DIAGNOSIS — Q221 Congenital pulmonary valve stenosis: Secondary | ICD-10-CM

## 2021-09-14 DIAGNOSIS — Q210 Ventricular septal defect: Secondary | ICD-10-CM

## 2021-09-14 DIAGNOSIS — Z8679 Personal history of other diseases of the circulatory system: Secondary | ICD-10-CM

## 2021-09-14 DIAGNOSIS — I5181 Takotsubo syndrome: Secondary | ICD-10-CM

## 2021-09-14 DIAGNOSIS — I10 Essential (primary) hypertension: Secondary | ICD-10-CM

## 2021-09-14 DIAGNOSIS — E7800 Pure hypercholesterolemia, unspecified: Secondary | ICD-10-CM

## 2021-09-14 DIAGNOSIS — E119 Type 2 diabetes mellitus without complications: Secondary | ICD-10-CM | POA: Diagnosis not present

## 2021-09-14 DIAGNOSIS — R0789 Other chest pain: Secondary | ICD-10-CM | POA: Diagnosis not present

## 2021-09-14 MED ORDER — NITROGLYCERIN 0.4 MG SL SUBL: 0.4000 mg | SUBLINGUAL_TABLET | SUBLINGUAL | 3 refills | Status: DC | PRN

## 2021-09-14 MED ORDER — AMLODIPINE BESYLATE 5 MG PO TABS: 5.0000 mg | ORAL_TABLET | Freq: Every day | ORAL | 3 refills | Status: DC

## 2021-09-14 NOTE — Progress Notes (Unsigned)
Enrolled patient for a 3 day Zio XT monitor to be mailed to patients home  

## 2021-09-14 NOTE — Patient Instructions (Addendum)
Medication Instructions:  ?Your physician has recommended you make the following change in your medication:  ? ?1) INCREASE Amlodipine '5mg'$  daily ? ?*If you need a refill on your cardiac medications before your next appointment, please call your pharmacy* ? ?Lab Work: ?NONE ? ?Testing/Procedures: ?Your physician has requested you wear a Zio heart monitor for 3 days. ? ?Follow-Up: ?At [facility], you and your health needs are our priority.  As part of our continuing mission to provide you with exceptional heart care, we have created designated Provider Care Teams.  These Care Teams include your primary Cardiologist (physician) and Advanced Practice Providers (APPs -  Physician Assistants and Nurse Practitioners) who all work together to provide you with the care you need, when you need it. ? ?Your next appointment:   ?1 month(s) ? ?The format for your next appointment:   ?In Person ? ?Provider:   ?Buford Dresser, MD { ? ?Other Instructions ?Your physician has referred you to the blood pressure clinic in 1 month, they will call you to schedule. ? ?ZIO XT- Long Term Monitor Instructions ? ?Your physician has requested you wear a ZIO patch monitor for 3 days.  ?This is a single patch monitor. [employer] supplies one patch monitor per enrollment. Additional ?stickers are not available. Please do not apply patch if you will be having a Nuclear Stress Test,  ?Echocardiogram, Cardiac CT, MRI, or Chest Xray during the period you would be wearing the  ?monitor. The patch cannot be worn during these tests. You cannot remove and re-apply the  ?ZIO XT patch monitor.  ?Your ZIO patch monitor will be mailed 3 day USPS to your address on file. It may take 3-5 days  ?to receive your monitor after you have been enrolled.  ?Once you have received your monitor, please review the enclosed instructions. Your monitor  ?has already been registered assigning a specific monitor serial # to you. ? ?Billing and Patient Assistance  Program Information ? ?We have supplied [employer] with any of your insurance information on file for billing purposes. ?[employer] offers a sliding scale Patient Assistance Program for patients that do not have  ?insurance, or whose insurance does not completely cover the cost of the ZIO monitor.  ?You must apply for the Patient Assistance Program to qualify for this discounted rate.  ?To apply, please call [employer] at [phone], select option 4, select option 2, ask to apply for  ?Patient Assistance Program. Theodore Demark will ask your household income, and how many people  ?are in your household. They will quote your out-of-pocket cost based on that information.  ?[employer] will also be able to set up a 27-month interest-free payment plan if needed. ? ?Applying the monitor ?  ?Shave hair from upper left chest.  ?Hold abrader disc by orange tab. Rub abrader in 40 strokes over the upper left chest as  ?indicated in your monitor instructions.  ?Clean area with 4 enclosed alcohol pads. Let dry.  ?Apply patch as indicated in monitor instructions. Patch will be placed under collarbone on left  ?side of chest with arrow pointing upward.  ?Rub patch adhesive wings for 2 minutes. Remove white label marked "1". Remove the white  ?label marked "2". Rub patch adhesive wings for 2 additional minutes.  ?While looking in a mirror, press and release button in center of patch. A small green light will  ?flash 3-4 times. This will be your only indicator that the monitor has been turned on.  ?Do not shower for the first  24 hours. You may shower after the first 24 hours.  ?Press the button if you feel a symptom. You will hear a small click. Record Date, Time and  ?Symptom in the Patient Logbook.  ?When you are ready to remove the patch, follow instructions on the last 2 pages of Patient  ?Logbook. Stick patch monitor onto the last page of Patient Logbook.  ?Place Patient Logbook in the blue and white box. Use locking tab on box and tape box  closed  ?securely. The blue and white box has prepaid postage on it. Please place it in the mailbox as  ?soon as possible. Your physician should have your test results approximately 7 days after the  ?monitor has been mailed back to [facility].  ?Call [facility] at [phone] if you have questions regarding  ?your ZIO XT patch monitor. Call them immediately if you see an orange light blinking on your  ?monitor.  ?If your monitor falls off in less than 4 days, contact our Monitor department at [phone].  ?If your monitor becomes loose or falls off after 4 days call [employer] at [phone] for  ?suggestions on securing your monitor ? ? ?Important Information About Sugar ? ? ? ? ?  ?

## 2021-09-20 DIAGNOSIS — I493 Ventricular premature depolarization: Secondary | ICD-10-CM | POA: Diagnosis not present

## 2021-09-25 ENCOUNTER — Encounter: Payer: Self-pay | Admitting: Obstetrics and Gynecology

## 2021-10-02 ENCOUNTER — Ambulatory Visit: Payer: Medicare Other

## 2021-10-02 NOTE — Progress Notes (Deleted)
10/02/2021 Stephanie Bray Nov 18, 1960 [id number]   HPI:  Stephanie Bray is a 61 y.o. female patient of Dr Harrell Gave, with a [place] below who presents today for hypertension clinic evaluation.  Past Medical History: hyperlipidemia   cardiomyopathy Potential Taotsubo episode in 2019, LV function resolved  DM2   PVC Was ordered 3 day monitor to determine PVC burden  GERD On daily omeprazole     Blood Pressure Goal:  130/80  Current Medications: amlodipine 5 mg qd,   Family Hx:  Social Hx:   Diet:   Exercise:   Home BP readings:   Intolerances:   Labs:    Wt Readings from Last 3 Encounters:  09/14/21 155 lb 9.6 oz (70.6 kg)  08/13/21 156 lb 9.6 oz (71 kg)  06/12/21 153 lb (69.4 kg)   BP Readings from Last 3 Encounters:  09/14/21 130/84  08/13/21 (!) 150/80  06/12/21 (!) 119/58   Pulse Readings from Last 3 Encounters:  09/14/21 76  08/13/21 63  06/12/21 65    Current Outpatient Medications  Medication Sig Dispense Refill   amLODipine (NORVASC) 5 MG tablet Take 1 tablet (5 mg total) by mouth daily. 90 tablet 3   cetirizine (ZYRTEC) 10 MG tablet Take 1 tablet (10 mg total) by mouth daily. 90 tablet 1   cholecalciferol (VITAMIN D3) 25 MCG (1000 UT) tablet Take 1,000 Units by mouth daily.     FEROSUL 325 (65 Fe) MG tablet TAKE ONE TABLET BY MOUTH EVERY OTHER DAY 45 tablet 0   furosemide (LASIX) 20 MG tablet Take 1 tablet (20 mg total) by mouth daily. 30 tablet 3   nitroGLYCERIN (NITROSTAT) 0.4 MG SL tablet Place 1 tablet (0.4 mg total) under the tongue every 5 (five) minutes as needed for chest pain. 25 tablet 3   Omega-3 Fatty Acids (FISH OIL) 1000 MG CAPS Take 1,000 mg by mouth daily.     omeprazole (PRILOSEC) 20 MG capsule TAKE ONE CAPSULE BY MOUTH DAILY 90 capsule 0   potassium chloride SA (KLOR-CON M) 20 MEQ tablet Take 1 tablet (20 mEq total) by mouth daily. 30 tablet 3   rosuvastatin (CRESTOR) 20 MG tablet TAKE ONE TABLET BY MOUTH DAILY 90 tablet 0    sertraline (ZOLOFT) 50 MG tablet Take 50 mg by mouth daily.     traZODone (DESYREL) 100 MG tablet Take 300 mg by mouth at bedtime.     No current facility-administered medications for this visit.    Allergies  Allergen Reactions   Flonase [Fluticasone Propionate]     Nose bleed   Other Other (See Comments)    Reaction unknown, per son   Ace Inhibitors     Other reaction(s): Cough    Past Medical History:  Diagnosis Date   Anxiety    Arthritis    "left hip" (04/25/2014)   Chest pain    a. 04/2014 MV: small anteroapical perfusion defect - likely breast attenuation->low risk.   Chronic bronchitis ([place])    "get it mostly q yr" (04/25/2014)   Depression    Essential hypertension    GERD (gastroesophageal reflux disease)    Heart murmur    High cholesterol    Iron deficiency anemia    Migraine    "weekly, at least" (04/25/2014)   Myocardial infarction [facility])    PTSD (post-traumatic stress disorder)    Stroke [facility])    VSD (ventricular septal defect)    a. 04/2014 Echo: EF 55-60%, PASP 26mHg, small restrictive  either supracristal or perimembranous VSD w/o PAH.    There were no vitals taken for this visit.  No problem-specific Assessment & Plan notes found for this encounter.   [name] PharmD CPP [facility] [address] [place], [place] [address] [phone]

## 2021-10-04 ENCOUNTER — Encounter: Payer: Self-pay | Admitting: Obstetrics and Gynecology

## 2021-10-09 ENCOUNTER — Telehealth: Payer: Self-pay | Admitting: Interventional Cardiology

## 2021-10-09 NOTE — Telephone Encounter (Signed)
Spoke with patient, discussed monitor results.  Per Dr. Tamala Julian: Let the patient know the monitor reveals 6% PVC. No correlation with complaints of dizziness.   Patient states she is still experiencing some dizziness but notices improvement when she drinks more water. Encouraged patient to drink at least 64 oz of fluid (mainly water) daily and to try drinking an extra bottle if she feels dizzy. Patient verbalized understanding and expressed appreciation for call.

## 2021-10-09 NOTE — Telephone Encounter (Signed)
Patient is returning call to discuss monitor results. 

## 2021-10-10 ENCOUNTER — Encounter: Payer: Self-pay | Admitting: *Deleted

## 2021-10-18 ENCOUNTER — Other Ambulatory Visit: Payer: Self-pay | Admitting: Internal Medicine

## 2021-11-02 ENCOUNTER — Other Ambulatory Visit: Payer: Self-pay | Admitting: Cardiology

## 2021-11-02 NOTE — Telephone Encounter (Signed)
Rx request sent to pharmacy.  

## 2021-11-22 ENCOUNTER — Other Ambulatory Visit: Payer: Self-pay | Admitting: Internal Medicine

## 2021-11-22 DIAGNOSIS — E119 Type 2 diabetes mellitus without complications: Secondary | ICD-10-CM

## 2021-12-04 ENCOUNTER — Encounter: Payer: Self-pay | Admitting: Obstetrics and Gynecology

## 2021-12-04 DIAGNOSIS — Z7689 Persons encountering health services in other specified circumstances: Secondary | ICD-10-CM

## 2021-12-04 DIAGNOSIS — Z01419 Encounter for gynecological examination (general) (routine) without abnormal findings: Secondary | ICD-10-CM

## 2021-12-04 DIAGNOSIS — Z124 Encounter for screening for malignant neoplasm of cervix: Secondary | ICD-10-CM

## 2021-12-28 ENCOUNTER — Encounter: Payer: Medicare Other | Admitting: Nurse Practitioner

## 2022-01-10 ENCOUNTER — Encounter: Payer: Self-pay | Admitting: Nurse Practitioner

## 2022-01-10 ENCOUNTER — Ambulatory Visit (INDEPENDENT_AMBULATORY_CARE_PROVIDER_SITE_OTHER): Payer: Medicare Other | Admitting: Nurse Practitioner

## 2022-01-10 VITALS — BP 144/70 | HR 69 | Temp 97.0°F | Resp 12 | Ht <= 58 in | Wt 153.4 lb

## 2022-01-10 DIAGNOSIS — M21619 Bunion of unspecified foot: Secondary | ICD-10-CM | POA: Insufficient documentation

## 2022-01-10 DIAGNOSIS — R251 Tremor, unspecified: Secondary | ICD-10-CM | POA: Diagnosis not present

## 2022-01-10 DIAGNOSIS — D509 Iron deficiency anemia, unspecified: Secondary | ICD-10-CM

## 2022-01-10 DIAGNOSIS — F411 Generalized anxiety disorder: Secondary | ICD-10-CM | POA: Diagnosis not present

## 2022-01-10 DIAGNOSIS — K219 Gastro-esophageal reflux disease without esophagitis: Secondary | ICD-10-CM

## 2022-01-10 DIAGNOSIS — I10 Essential (primary) hypertension: Secondary | ICD-10-CM

## 2022-01-10 DIAGNOSIS — Z23 Encounter for immunization: Secondary | ICD-10-CM | POA: Diagnosis not present

## 2022-01-10 DIAGNOSIS — F32A Depression, unspecified: Secondary | ICD-10-CM | POA: Diagnosis not present

## 2022-01-10 DIAGNOSIS — I341 Nonrheumatic mitral (valve) prolapse: Secondary | ICD-10-CM

## 2022-01-10 DIAGNOSIS — E119 Type 2 diabetes mellitus without complications: Secondary | ICD-10-CM

## 2022-01-10 DIAGNOSIS — E785 Hyperlipidemia, unspecified: Secondary | ICD-10-CM

## 2022-01-10 NOTE — Progress Notes (Signed)
New Patient Office Visit  Subjective    Patient ID: Stephanie Bray, female    DOB: 05/21/1960  Age: 61 y.o. MRN: [id number]  CC:  Chief Complaint  Patient presents with   Establish Care    Previous PCP Dr Lavera Guise   Tremors    Right hand shaking/tremor this year. Has gotten worse.   Nail Problem    Toe nails discoloration, fungus?    HPI Stephanie Bray presents to establish care   DM2: States that she does not check sugar and does not carry the dx of diabetes per patient report  HTN: does not check at home regularly but does have a cuff.  Patient currently maintained on amlodipine 5 mg daily.   Seizures: neurology states it was anxiety.  Patient is not currently maintained on antiepileptic medications.  HLD: Patient is currently maintained on rosuvastatin 20 mg daily.  Anxiety and depression: Top priority every second Monday in the month. [address]. They manage the meds and does therapy.  They manage the Zoloft and trazodone  Colonoscopy: 2023 clean recall in 10 years.  Recall in 2033  Flu: Update today   COVID-vaccine: Up-to-date  Shingles vaccine: Encouraged patient to get this at her local pharmacy.  Tremor: States that she has a tremor on the right side and it has been over the year. States that she feels like it has gotten worse. No numbness. There with movement and rest. States that when she holds on tight to something it stops.   Nail discoloration: States that she has tried the band aid with medicine over the coutner. She tired that over a couple weeks. States that she will soak her feet with soap that has not helped. States no itching or pain    Outpatient Encounter Medications as of 01/10/2022  Medication Sig   amLODipine (NORVASC) 5 MG tablet Take 1 tablet (5 mg total) by mouth daily.   cetirizine (ZYRTEC) 10 MG tablet TAKE ONE TABLET BY MOUTH DAILY   cholecalciferol (VITAMIN D3) 25 MCG (1000 UT) tablet Take 1,000 Units by mouth daily.   FEROSUL  325 (65 Fe) MG tablet TAKE ONE TABLET BY MOUTH EVERY OTHER DAY   furosemide (LASIX) 20 MG tablet TAKE 1 TABLET (20 MG TOTAL) BY MOUTH DAILY.   Omega-3 Fatty Acids (FISH OIL) 1000 MG CAPS Take 1,000 mg by mouth daily.   omeprazole (PRILOSEC) 20 MG capsule TAKE ONE CAPSULE BY MOUTH DAILY   potassium chloride SA (KLOR-CON M) 20 MEQ tablet TAKE 1 TABLET (20 MEQ TOTAL) BY MOUTH DAILY.   rosuvastatin (CRESTOR) 20 MG tablet TAKE ONE TABLET BY MOUTH DAILY   sertraline (ZOLOFT) 50 MG tablet Take 50 mg by mouth daily.   traZODone (DESYREL) 100 MG tablet Take 300 mg by mouth at bedtime.   nitroGLYCERIN (NITROSTAT) 0.4 MG SL tablet Place 1 tablet (0.4 mg total) under the tongue every 5 (five) minutes as needed for chest pain.   No facility-administered encounter medications on file as of 01/10/2022.    Past Medical History:  Diagnosis Date   Anxiety    Arthritis    "left hip" (04/25/2014)   Chest pain    a. 04/2014 MV: small anteroapical perfusion defect - likely breast attenuation->low risk.   Chronic bronchitis ([place])    "get it mostly q yr" (04/25/2014)   Depression    Essential hypertension    GERD (gastroesophageal reflux disease)    Heart murmur    High cholesterol  Iron deficiency anemia    Migraine    "weekly, at least" (04/25/2014)   Myocardial infarction [facility])    PTSD (post-traumatic stress disorder)    Stroke [facility])    VSD (ventricular septal defect)    a. 04/2014 Echo: EF 55-60%, PASP 27mHg, small restrictive either supracristal or perimembranous VSD w/o PAH.    Past Surgical History:  Procedure Laterality Date   CHOLECYSTECTOMY     COLONOSCOPY WITH PROPOFOL N/A 06/12/2021   Procedure: COLONOSCOPY WITH PROPOFOL;  Surgeon: WLucilla Lame MD;  [facility];  Service: Endoscopy;  Laterality: N/A;   RIGHT/LEFT HEART CATH AND CORONARY ANGIOGRAPHY N/A 12/01/2017   Procedure: RIGHT/LEFT HEART CATH AND CORONARY ANGIOGRAPHY;  Surgeon: HLeonie Man MD;  [facility];  Service: Cardiovascular;  Laterality: N/A;   VSD REPAIR  ~ 1963   [facility]/notes 04/25/2014    Family History  Problem Relation Age of Onset   COPD Mother    Hypertension Father    Hyperlipidemia Father     Social History   Socioeconomic History   Marital status: Divorced    Spouse name: Not on file   Number of children: 3   Years of education: Not on file   Highest education level: Not on file  Occupational History   Occupation: Disabled  Tobacco Use   Smoking status: Never   Smokeless tobacco: Never  Vaping Use   Vaping Use: Never used  Substance and Sexual Activity   Alcohol use: No   Drug use: No   Sexual activity: Not Currently  Other Topics Concern   Not on file  Social History Narrative   [place]    Hobbies: reads and draw    On disability due to anxiety    Social Determinants of Health   Financial Resource Strain: High Risk (05/18/2021)   Overall Financial Resource Strain (CARDIA)    Difficulty of Paying Living Expenses: Hard  Food Insecurity: No Food Insecurity (05/18/2021)   Hunger Vital Sign    Worried About Running Out of Food in the Last Year: Never true    Ran Out of Food in the Last Year: Never true  Transportation Needs: No Transportation Needs (05/18/2021)   PRAPARE - THydrologist(Medical): No    Lack of Transportation (Non-Medical): No  Physical Activity: Insufficiently Active (05/18/2021)   Exercise Vital Sign    Days of Exercise per Week: 2 days    Minutes of Exercise per Session: 20 min  Stress: No Stress Concern Present (05/18/2021)   FHemet   Feeling of Stress : Only a little  Social Connections: Moderately Integrated (05/18/2021)   Social Connection and Isolation Panel [NHANES]    Frequency of Communication with Friends and Family: Once a week    Frequency of Social Gatherings with Friends and  Family: More than three times a week    Attends Religious Services: More than 4 times per year    Active Member of [employer] Organizations: Yes    Attends CArchivistMeetings: More than 4 times per year    Marital Status: Divorced  Intimate Partner Violence: Not At Risk (05/18/2021)   Humiliation, Afraid, Rape, and Kick questionnaire    Fear of Current or Ex-Partner: No    Emotionally Abused: No    Physically Abused: No    Sexually Abused: No    Review of Systems  Constitutional:  Negative for chills and fever.  Respiratory:  Negative for shortness of breath.   Cardiovascular:  Positive for leg swelling. Negative for chest pain.  Gastrointestinal:  Negative for abdominal pain, constipation, diarrhea, nausea and vomiting.       BM daily with metamucil   Genitourinary:  Negative for dysuria and hematuria.  Neurological:  Positive for tremors and headaches (with ibuprofen).  Psychiatric/Behavioral:  Negative for hallucinations (hearing cat sounds) and suicidal ideas.         Objective    BP (!) 144/70   Pulse 69   Temp (!) 97 F (36.1 C)   Resp 12   Ht '4\' 10"'$  (1.473 m)   Wt 153 lb 6 oz (69.6 kg)   SpO2 97%   BMI 32.06 kg/m   Physical Exam Vitals and nursing note reviewed.  Constitutional:      Appearance: Normal appearance.  HENT:     Right Ear: Tympanic membrane, ear canal and external ear normal.     Left Ear: Tympanic membrane, ear canal and external ear normal.     Mouth/Throat:     Mouth: Mucous membranes are moist.     Pharynx: Oropharynx is clear.  Eyes:     Extraocular Movements: Extraocular movements intact.     Pupils: Pupils are equal, round, and reactive to light.  Cardiovascular:     Rate and Rhythm: Normal rate and regular rhythm.     Pulses: Normal pulses.     Heart sounds: Murmur heard.  Pulmonary:     Effort: Pulmonary effort is normal.     Breath sounds: Normal breath sounds.  Abdominal:     General: Bowel sounds are normal. There is  no distension.     Palpations: There is no mass.     Tenderness: There is no abdominal tenderness.     Hernia: A hernia is present.  Musculoskeletal:     Right lower leg: No edema.     Left lower leg: No edema.       Feet:  Lymphadenopathy:     Cervical: No cervical adenopathy.  Skin:    General: Skin is warm.  Neurological:     General: No focal deficit present.     Mental Status: She is alert.     Deep Tendon Reflexes:     Reflex Scores:      Bicep reflexes are 2+ on the right side and 2+ on the left side.      Patellar reflexes are 2+ on the right side and 2+ on the left side.    Comments: Bilateral upper and lower extremity strength 5/5  Tremor noticed in bilateral upper extremities.  No cogwheel rigidity.  Tremor greater on the right versus the left.  Noticed at rest and with movement         Assessment & Plan:   Problem List Items Addressed This Visit       Cardiovascular and Mediastinum   Essential hypertension    Blood pressure slightly elevated in office.  Patient is managed by cardiology did review recent blood pressures she has had several within normal limits.  Pressure check and highly continue taking amlodipine 5 mg as prescribed.      Relevant Orders   Microalbumin/Creatinine Ratio, Urine   CBC   Comprehensive metabolic panel   TSH   Mitral valve prolapse determined by imaging    Patient is followed by cardiology appreciable murmur        Digestive  GERD (gastroesophageal reflux disease)    Patient currently on omeprazole.  Continue medication as prescribed        Endocrine   Diabetes mellitus without complication ([place])    Pending A1c not on antidiabetic medications currently.      Relevant Orders   Microalbumin/Creatinine Ratio, Urine   CBC   Comprehensive metabolic panel   Hemoglobin A1c     Musculoskeletal and Integument   Bunion    Left great toe on the medial side.  Patient also has dystrophic toenails.  Ambulatory referral to  podiatry placed today      Relevant Orders   Ambulatory referral to Podiatry     Other   Anxiety state - Primary   Depression   HLD (hyperlipidemia)    Patient currently managed on rosuvastatin 20 mg.      Iron deficiency anemia   Tremor    Check basic labs if normal can refer to GI for further work-up.      Relevant Orders   TSH   Other Visit Diagnoses     Need for influenza vaccination       Relevant Orders   Flu Vaccine QUAD 6+ mos PF IM (Fluarix Quad PF)       Return in about 6 months (around 07/11/2022) for Recheck .   Romilda Garret, NP

## 2022-01-10 NOTE — Assessment & Plan Note (Signed)
Patient is followed by cardiology appreciable murmur

## 2022-01-10 NOTE — Assessment & Plan Note (Signed)
Pending A1c not on antidiabetic medications currently.

## 2022-01-10 NOTE — Assessment & Plan Note (Signed)
Check basic labs if normal can refer to GI for further work-up.

## 2022-01-10 NOTE — Patient Instructions (Signed)
Nice to see you today If you can check your blood pressure at home 3 times a week and write it down, then call the office with the numbers for me I want to see you in 6 months, sooner if you need me I also will be in touch with the labs once I have them

## 2022-01-10 NOTE — Assessment & Plan Note (Signed)
Patient currently managed on rosuvastatin 20 mg.

## 2022-01-10 NOTE — Assessment & Plan Note (Signed)
Blood pressure slightly elevated in office.  Patient is managed by cardiology did review recent blood pressures she has had several within normal limits.  Pressure check and highly continue taking amlodipine 5 mg as prescribed.

## 2022-01-10 NOTE — Assessment & Plan Note (Signed)
Patient currently on omeprazole.  Continue medication as prescribed

## 2022-01-10 NOTE — Assessment & Plan Note (Signed)
Left great toe on the medial side.  Patient also has dystrophic toenails.  Ambulatory referral to podiatry placed today

## 2022-01-11 LAB — CBC
HCT: 38.2 % (ref 36.0–46.0)
Hemoglobin: 12 g/dL (ref 12.0–15.0)
MCHC: 31.3 g/dL (ref 30.0–36.0)
MCV: 73.9 fl — ABNORMAL LOW (ref 78.0–100.0)
Platelets: 222 10*3/uL (ref 150.0–400.0)
RBC: 5.17 Mil/uL — ABNORMAL HIGH (ref 3.87–5.11)
RDW: 16.9 % — ABNORMAL HIGH (ref 11.5–15.5)
WBC: 7.2 10*3/uL (ref 4.0–10.5)

## 2022-01-11 LAB — COMPREHENSIVE METABOLIC PANEL
ALT: 17 U/L (ref 0–35)
AST: 24 U/L (ref 0–37)
Albumin: 4.6 g/dL (ref 3.5–5.2)
Alkaline Phosphatase: 86 U/L (ref 39–117)
BUN: 18 mg/dL (ref 6–23)
CO2: 30 mEq/L (ref 19–32)
Calcium: 9.9 mg/dL (ref 8.4–10.5)
Chloride: 102 mEq/L (ref 96–112)
Creatinine, Ser: 0.64 mg/dL (ref 0.40–1.20)
GFR: 95.68 mL/min (ref >60.00)
Glucose, Bld: 83 mg/dL (ref 70–99)
Potassium: 4.1 mEq/L (ref 3.5–5.1)
Sodium: 140 mEq/L (ref 135–145)
Total Bilirubin: 0.5 mg/dL (ref 0.2–1.2)
Total Protein: 7.5 g/dL (ref 6.0–8.3)

## 2022-01-11 LAB — MICROALBUMIN / CREATININE URINE RATIO
Creatinine,U: 160.6 mg/dL
Microalb Creat Ratio: 1.8 mg/g (ref 0.0–30.0)
Microalb, Ur: 2.9 mg/dL — ABNORMAL HIGH (ref 0.0–1.9)

## 2022-01-11 LAB — HEMOGLOBIN A1C: Hgb A1c MFr Bld: 6.9 % — ABNORMAL HIGH (ref 4.6–6.5)

## 2022-01-11 LAB — TSH: TSH: 1.92 u[IU]/mL (ref 0.35–5.50)

## 2022-01-15 ENCOUNTER — Other Ambulatory Visit: Payer: Self-pay | Admitting: Nurse Practitioner

## 2022-01-15 NOTE — Telephone Encounter (Signed)
If she has tried and failed metformin we can do an injectable medication like ozempic or trulicity

## 2022-01-15 NOTE — Telephone Encounter (Signed)
Left message to call back about medication

## 2022-01-15 NOTE — Telephone Encounter (Signed)
Left message to call back  

## 2022-01-15 NOTE — Telephone Encounter (Signed)
-----   Message from [place] sent at 01/14/2022  4:56 PM EDT ----- Patient advised. Patient did try Metformin in the past, 2 to 3 years ago and had severe nausea. Has not tried any other medications for sugar. She is willing to try something else.

## 2022-01-15 NOTE — Telephone Encounter (Signed)
Patient called back in returning a call she received.  

## 2022-01-16 NOTE — Telephone Encounter (Signed)
Patient called in and she stated she is fine with the medication but may need assistance because her hands shake a lot. Thank you!

## 2022-01-16 NOTE — Telephone Encounter (Signed)
Called back no answer 

## 2022-01-16 NOTE — Telephone Encounter (Signed)
Pt returned call

## 2022-01-16 NOTE — Telephone Encounter (Signed)
Left detailed message for the patient letting her know about medication Stephanie Bray would like to try Waiting to hear back if patient is ok with trying this medication

## 2022-01-16 NOTE — Telephone Encounter (Signed)
Left message to call back  

## 2022-01-16 NOTE — Telephone Encounter (Signed)
Pt returned call [phone]

## 2022-01-17 NOTE — Telephone Encounter (Signed)
Spoke with patient. She is not comfortable with doing injections due to her hands tremor. She would like to try oral medication. If there is nothing else aside of Metformin products she is willing to try that again.  Also, she would like something to check her sugar but not able to prick her fingers due to her hands shaking/unsteadiness. Advised patient we can try something like Dexcom and see if this would be approved but I could not guarantee. Which one should patient try?

## 2022-01-18 NOTE — Telephone Encounter (Signed)
Patient advised. Patient is agreeable to the medication.

## 2022-01-18 NOTE — Telephone Encounter (Signed)
We can try a [place] or jardanice. It helps with sugar levels and offeres some protection for the heart. One of the major side effects can be yeast infections because you pee out the sugar. If she is fine with that I will send one of them in

## 2022-01-18 NOTE — Addendum Note (Signed)
Addended by: Kris Mouton on: 01/18/2022 03:50 PM   Modules accepted: Orders

## 2022-01-19 MED ORDER — DEXCOM G6 SENSOR MISC: 1.0000 | 3 refills | Status: DC

## 2022-01-19 MED ORDER — DEXCOM G6 RECEIVER DEVI: 1.0000 | 0 refills | Status: DC

## 2022-01-19 MED ORDER — DEXCOM G6 TRANSMITTER MISC: 1.0000 | 0 refills | Status: DC

## 2022-01-21 NOTE — Telephone Encounter (Signed)
Jardiance or Wilder Glade was sent in as states per note below. Please review

## 2022-01-23 ENCOUNTER — Telehealth: Payer: Self-pay | Admitting: Nurse Practitioner

## 2022-01-23 ENCOUNTER — Ambulatory Visit (INDEPENDENT_AMBULATORY_CARE_PROVIDER_SITE_OTHER): Payer: Medicare Other | Admitting: Podiatry

## 2022-01-23 ENCOUNTER — Ambulatory Visit: Payer: Medicare Other

## 2022-01-23 DIAGNOSIS — D367 Benign neoplasm of other specified sites: Secondary | ICD-10-CM

## 2022-01-23 DIAGNOSIS — M79676 Pain in unspecified toe(s): Secondary | ICD-10-CM | POA: Diagnosis not present

## 2022-01-23 DIAGNOSIS — E119 Type 2 diabetes mellitus without complications: Secondary | ICD-10-CM | POA: Diagnosis not present

## 2022-01-23 DIAGNOSIS — B351 Tinea unguium: Secondary | ICD-10-CM

## 2022-01-23 DIAGNOSIS — M2010 Hallux valgus (acquired), unspecified foot: Secondary | ICD-10-CM | POA: Diagnosis not present

## 2022-01-23 MED ORDER — EMPAGLIFLOZIN 10 MG PO TABS: 10.0000 mg | ORAL_TABLET | Freq: Every day | ORAL | 0 refills | Status: DC

## 2022-01-23 NOTE — Addendum Note (Signed)
Addended by: Loura Pardon A on: 01/23/2022 08:20 PM   Modules accepted: Orders

## 2022-01-23 NOTE — Progress Notes (Signed)
Subjective:  Patient ID: Stephanie Bray, female    DOB: 08-10-1960,  MRN: [id number] HPI No chief complaint on file.   61 y.o. female presents with the above complaint.   ROS: Denies fever chills nausea vomiting muscle aches pains calf pain back pain chest pain shortness of breath.  Past Medical History:  Diagnosis Date   Anxiety    Arthritis    "left hip" (04/25/2014)   Chest pain    a. 04/2014 MV: small anteroapical perfusion defect - likely breast attenuation->low risk.   Chronic bronchitis ([place])    "get it mostly q yr" (04/25/2014)   Depression    Essential hypertension    GERD (gastroesophageal reflux disease)    Heart murmur    High cholesterol    Iron deficiency anemia    Migraine    "weekly, at least" (04/25/2014)   Myocardial infarction [facility])    PTSD (post-traumatic stress disorder)    Stroke [facility])    VSD (ventricular septal defect)    a. 04/2014 Echo: EF 55-60%, PASP 77mHg, small restrictive either supracristal or perimembranous VSD w/o PAH.   Past Surgical History:  Procedure Laterality Date   CHOLECYSTECTOMY     COLONOSCOPY WITH PROPOFOL N/A 06/12/2021   Procedure: COLONOSCOPY WITH PROPOFOL;  Surgeon: WLucilla Lame MD;  [facility];  Service: Endoscopy;  Laterality: N/A;   RIGHT/LEFT HEART CATH AND CORONARY ANGIOGRAPHY N/A 12/01/2017   Procedure: RIGHT/LEFT HEART CATH AND CORONARY ANGIOGRAPHY;  Surgeon: HLeonie Man MD;  [facility];  Service: Cardiovascular;  Laterality: N/A;   VSD REPAIR  ~ 1963   [facility]/notes 04/25/2014    Current Outpatient Medications:    amLODipine (NORVASC) 5 MG tablet, Take 1 tablet (5 mg total) by mouth daily., Disp: 90 tablet, Rfl: 3   cetirizine (ZYRTEC) 10 MG tablet, TAKE ONE TABLET BY MOUTH DAILY, Disp: 90 tablet, Rfl: 4   cholecalciferol (VITAMIN D3) 25 MCG (1000 UT) tablet, Take 1,000 Units by mouth daily., Disp: , Rfl:    Continuous Blood Gluc Receiver (DSkedee DEVI, 1 Application  by Does not apply route as directed., Disp: 1 each, Rfl: 0   Continuous Blood Gluc Sensor (DEXCOM G6 SENSOR) MISC, 1 Application by Does not apply route as directed., Disp: 1 each, Rfl: 3   Continuous Blood Gluc Transmit (DEXCOM G6 TRANSMITTER) MISC, 1 Application by Does not apply route as directed., Disp: 1 each, Rfl: 0   FEROSUL 325 (65 Fe) MG tablet, TAKE ONE TABLET BY MOUTH EVERY OTHER DAY, Disp: 45 tablet, Rfl: 0   furosemide (LASIX) 20 MG tablet, TAKE 1 TABLET (20 MG TOTAL) BY MOUTH DAILY., Disp: 30 tablet, Rfl: 2   nitroGLYCERIN (NITROSTAT) 0.4 MG SL tablet, Place 1 tablet (0.4 mg total) under the tongue every 5 (five) minutes as needed for chest pain., Disp: 25 tablet, Rfl: 3   Omega-3 Fatty Acids (FISH OIL) 1000 MG CAPS, Take 1,000 mg by mouth daily., Disp: , Rfl:    omeprazole (PRILOSEC) 20 MG capsule, TAKE ONE CAPSULE BY MOUTH DAILY, Disp: 90 capsule, Rfl: 0   potassium chloride SA (KLOR-CON M) 20 MEQ tablet, TAKE 1 TABLET (20 MEQ TOTAL) BY MOUTH DAILY., Disp: 30 tablet, Rfl: 2   rosuvastatin (CRESTOR) 20 MG tablet, TAKE ONE TABLET BY MOUTH DAILY, Disp: 90 tablet, Rfl: 1   sertraline (ZOLOFT) 50 MG tablet, Take 50 mg by mouth daily., Disp: , Rfl:    traZODone (DESYREL) 100 MG tablet, Take 300 mg by  mouth at bedtime., Disp: , Rfl:   Allergies  Allergen Reactions   Flonase [Fluticasone Propionate]     Nose bleed   Metformin And Related Nausea Only   Other Other (See Comments)    Reaction unknown, per son   Ace Inhibitors     Other reaction(s): Cough   Review of Systems Objective:  There were no vitals filed for this visit.  General: Well developed, nourished, in no acute distress, alert and oriented x3   Dermatological: Skin is warm, dry and supple bilateral. Nails x 10 are well maintained though they are thick yellow dystrophic clinically mycotic; remaining integument appears unremarkable at this time. There are no open sores, no preulcerative lesions, no rash or signs of  infection present.  She also has benign skin lesion medial aspect of the hallux left  Vascular: Dorsalis Pedis artery and Posterior Tibial artery pedal pulses are 2/4 bilateral with immedate capillary fill time. Pedal hair growth present. No varicosities and no lower extremity edema present bilateral.   Neruologic: Grossly intact via light touch bilateral. Vibratory intact via tuning fork bilateral. Protective threshold with Semmes Wienstein monofilament intact to all pedal sites bilateral. Patellar and Achilles deep tendon reflexes 2+ bilateral. No Babinski or clonus noted bilateral.   Musculoskeletal: No gross boney pedal deformities bilateral. No pain, crepitus, or limitation noted with foot and ankle range of motion bilateral. Muscular strength 5/5 in all groups tested bilateral.  Gait: Unassisted, Nonantalgic.    Radiographs:  None taken  Assessment & Plan:   Assessment: Pain in limb secondary to onychomycosis and benign skin lesion.  Diabetes mellitus type 2 noncomplicated.  Plan: Debrided toenails and benign skin lesion bilateral.     [employer] [address] [place], [place]

## 2022-01-23 NOTE — Telephone Encounter (Signed)
Patient called in to get an update on the sensor(dexcom) and medication that she's suppose to release for her blood sugar. She said that she haven't heard back from anyone regarding this.

## 2022-01-23 NOTE — Telephone Encounter (Signed)
I sent jardiance 10 mg to take one daily  If side effects hold it and let me know   Will let pcp decide when to f/u

## 2022-01-24 MED ORDER — DEXCOM G6 SENSOR MISC: 1.0000 | 3 refills | Status: DC

## 2022-01-24 MED ORDER — DEXCOM G6 RECEIVER DEVI: 1.0000 | 3 refills | Status: DC

## 2022-01-24 MED ORDER — DEXCOM G6 TRANSMITTER MISC: 1.0000 | 4 refills | Status: AC

## 2022-01-24 NOTE — Telephone Encounter (Signed)
Left message to call back Per pharmacist Vania Rea will be filled on 01/28/22 when all of her other medications are due so she can pick them up all together at once. Since patient has Medicare part a and b Dexcom has to be filled under part D of her insurance they are not able to fill it. We can send it to [facility] and have them process it for the patient if she is agreeable, this does not guarantee coverage.

## 2022-01-24 NOTE — Telephone Encounter (Signed)
Spoke with patient and relayed all the information. Engineer, maintenance for [employer] to [employer] [place], [place], [place] [address]. Patient was given their phone number and advised to follow up with them on Part D filling of the Dexcom and let me know if there are any issues with this

## 2022-01-24 NOTE — Addendum Note (Signed)
Addended by: Kris Mouton on: 01/24/2022 11:31 AM   Modules accepted: Orders

## 2022-01-24 NOTE — Telephone Encounter (Signed)
See other phone note

## 2022-02-02 ENCOUNTER — Other Ambulatory Visit: Payer: Self-pay | Admitting: Internal Medicine

## 2022-02-02 DIAGNOSIS — E119 Type 2 diabetes mellitus without complications: Secondary | ICD-10-CM

## 2022-02-07 ENCOUNTER — Other Ambulatory Visit: Payer: Self-pay

## 2022-02-07 DIAGNOSIS — E119 Type 2 diabetes mellitus without complications: Secondary | ICD-10-CM

## 2022-02-07 MED ORDER — FREESTYLE LIBRE 3 SENSOR MISC: 2.0000 | 12 refills | Status: DC

## 2022-02-07 MED ORDER — FREESTYLE LIBRE READER DEVI: 1.0000 | Freq: Once | 0 refills | Status: AC

## 2022-02-07 NOTE — Telephone Encounter (Signed)
Patient called in and stated that [facility] hasn't received any paperwork for her meter. She stated that she would like for [id number] [place]

## 2022-02-07 NOTE — Telephone Encounter (Signed)
Spoke with patient. Patient does not qualify for Dexcom with Medicare insurance and their guidelines. Merchant navy officer for [employer] for patient to take to DME store to get this filled. Left RX up front and patient is aware to search for a supply store to help her fill this.

## 2022-02-07 NOTE — Addendum Note (Signed)
Addended by: Kris Mouton on: 02/07/2022 04:20 PM   Modules accepted: Orders

## 2022-04-02 ENCOUNTER — Other Ambulatory Visit: Payer: Self-pay | Admitting: Nurse Practitioner

## 2022-04-02 NOTE — Telephone Encounter (Signed)
Caller Name: [place] Call back phone #: [phone]  MEDICATION(S):  empagliflozin (JARDIANCE) 10 MG TABS tablet   Days of Med Remaining: 1 bottle remaining   Has the patient contacted their pharmacy (YES/NO)? NO What did pharmacy advise?   Preferred Pharmacy:  [facility]    ~~~Please advise patient/caregiver to allow 2-3 business days to process RX refills.    Pt stated 3 bottles was given to her & she's down to 1 bottle now.

## 2022-04-02 NOTE — Telephone Encounter (Signed)
Request for next fill Days of Med Remaining: 1 bottle remaining

## 2022-04-04 MED ORDER — EMPAGLIFLOZIN 10 MG PO TABS: 10.0000 mg | ORAL_TABLET | Freq: Every day | ORAL | 1 refills | Status: DC

## 2022-04-08 ENCOUNTER — Other Ambulatory Visit: Payer: Self-pay | Admitting: Cardiology

## 2022-04-08 ENCOUNTER — Other Ambulatory Visit: Payer: Self-pay | Admitting: Internal Medicine

## 2022-04-08 DIAGNOSIS — E119 Type 2 diabetes mellitus without complications: Secondary | ICD-10-CM

## 2022-04-08 NOTE — Telephone Encounter (Signed)
Rx request sent to pharmacy.  

## 2022-04-11 ENCOUNTER — Ambulatory Visit (INDEPENDENT_AMBULATORY_CARE_PROVIDER_SITE_OTHER): Payer: Medicare Other | Admitting: Nurse Practitioner

## 2022-04-11 ENCOUNTER — Encounter: Payer: Self-pay | Admitting: Nurse Practitioner

## 2022-04-11 VITALS — BP 126/72 | HR 55 | Temp 97.1°F | Ht <= 58 in | Wt 152.4 lb

## 2022-04-11 DIAGNOSIS — E119 Type 2 diabetes mellitus without complications: Secondary | ICD-10-CM | POA: Diagnosis not present

## 2022-04-11 DIAGNOSIS — I10 Essential (primary) hypertension: Secondary | ICD-10-CM | POA: Diagnosis not present

## 2022-04-11 LAB — POCT GLYCOSYLATED HEMOGLOBIN (HGB A1C): Hemoglobin A1C: 6.1 % — AB (ref 4.0–5.6)

## 2022-04-11 NOTE — Patient Instructions (Signed)
Your A1C looks good it was 6.1% Your blood pressure looks good too Continue the medications as is.  Follow up with me in 3 months with me, sooner If you need me

## 2022-04-11 NOTE — Progress Notes (Signed)
Established Patient Office Visit  Subjective   Patient ID: Stephanie Bray, female    DOB: [birth year]  Age: 61 y.o. MRN: [id number]  Chief Complaint  Patient presents with   Follow-up    Here for 3 mo f/u.    HPI  DM2: currenlty on jardiance 10 mg. Does not have a machine at home and does not check her sugar. 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  States that her son has a BP cuff at home if she needs it  States that she had been walking and states now she is not because of the weather Tries once a week. States on it 10-15 mins.      Review of Systems  Constitutional:  Negative for chills and fever.  Respiratory:  Negative for shortness of breath.   Cardiovascular:  Positive for leg swelling. Negative for chest pain.  Gastrointestinal:  Negative for abdominal pain, blood in stool, constipation, diarrhea, nausea and vomiting.       BM every other day   Genitourinary:  Negative for dysuria and hematuria.  Neurological:  Positive for tingling (right leg  this past week). Negative for headaches.      Objective:     BP 126/72   Pulse (!) 55   Temp (!) 97.1 F (36.2 C) (Temporal)   Ht '4\' 10"'$  (1.473 m)   Wt 152 lb 6.4 oz (69.1 kg)   SpO2 96%   BMI 31.85 kg/m  BP Readings from Last 3 Encounters:  04/11/22 126/72  01/10/22 (!) 144/70  09/14/21 130/84   Wt Readings from Last 3 Encounters:  04/11/22 152 lb 6.4 oz (69.1 kg)  01/10/22 153 lb 6 oz (69.6 kg)  09/14/21 155 lb 9.6 oz (70.6 kg)      Physical Exam Vitals and nursing note reviewed.  Constitutional:      Appearance: Normal appearance. She is obese.  Cardiovascular:     Rate and Rhythm: Normal rate and regular rhythm.     Pulses:          Dorsalis pedis pulses are 2+ on the right side and 2+ on the left side.     Heart sounds: Normal heart sounds.  Pulmonary:     Breath sounds: Normal breath sounds.  Musculoskeletal:     Right lower leg:  Edema present.     Left lower leg: Edema present.  Feet:     Right foot:     Skin integrity: Dry skin present.     Left foot:     Skin integrity: Dry skin present.  Neurological:     Mental Status: She is alert.      Results for orders placed or performed in visit on 04/11/22  POCT glycosylated hemoglobin (Hb A1C)  Result Value Ref Range   Hemoglobin A1C 6.1 (A) 4.0 - 5.6 %   HbA1c POC (<> result, manual entry)     HbA1c, POC (prediabetic range)     HbA1c, POC (controlled diabetic range)        The ASCVD Risk score (Arnett DK, et al., 2019) failed to calculate for the following reasons:   The patient has a prior MI or stroke diagnosis    Assessment & Plan:   Problem List Items Addressed This Visit       Cardiovascular and Mediastinum   Essential hypertension    Blood pressure well-controlled in office today.  Continue taking amlodipine as prescribed.  Relevant Orders   Basic metabolic panel     Endocrine   Diabetes mellitus without complication (Stephanie Bray) - Primary    Patient currently maintained on Jardiance 10 mg.  Pending BMP today.  Patient's A1c went from 6.9 to 6.1%.  She is working on her lifestyle modifications regard to diet did asked patient to increase her stationary bike use from once a week to twice a week.  Continue medications without change follow-up 3 months.      Relevant Orders   POCT glycosylated hemoglobin (Hb A1C) (Completed)   Basic metabolic panel    Return in about 3 months (around 07/11/2022) for DM recheck .    Romilda Garret, NP

## 2022-04-11 NOTE — Assessment & Plan Note (Signed)
Patient currently maintained on Jardiance 10 mg.  Pending BMP today.  Patient's A1c went from 6.9 to 6.1%.  She is working on her lifestyle modifications regard to diet did asked patient to increase her stationary bike use from once a week to twice a week.  Continue medications without change follow-up 3 months.

## 2022-04-11 NOTE — Assessment & Plan Note (Signed)
Blood pressure well-controlled in office today.  Continue taking amlodipine as prescribed.

## 2022-04-12 LAB — BASIC METABOLIC PANEL
BUN: 14 mg/dL (ref 6–23)
CO2: 30 mEq/L (ref 19–32)
Calcium: 9.6 mg/dL (ref 8.4–10.5)
Chloride: 104 mEq/L (ref 96–112)
Creatinine, Ser: 0.53 mg/dL (ref 0.40–1.20)
GFR: 99.95 mL/min (ref >60.00)
Glucose, Bld: 100 mg/dL — ABNORMAL HIGH (ref 70–99)
Potassium: 3.2 mEq/L — ABNORMAL LOW (ref 3.5–5.1)
Sodium: 142 mEq/L (ref 135–145)

## 2022-04-15 ENCOUNTER — Telehealth: Payer: Self-pay | Admitting: Nurse Practitioner

## 2022-04-15 NOTE — Telephone Encounter (Signed)
Patient called back in and returning a call she received.

## 2022-04-16 NOTE — Telephone Encounter (Signed)
See lab results.  

## 2022-04-17 ENCOUNTER — Telehealth: Payer: Self-pay | Admitting: Nurse Practitioner

## 2022-04-17 NOTE — Telephone Encounter (Signed)
-----   Message from Barkley Bruns, [place] sent at 04/16/2022  4:41 PM EST ----- Called patient reviewed all information and repeated back to me. Will call if any questions.  Pt states she is taking the potassium every other day, and wanted to know if she should take them everyday.

## 2022-04-17 NOTE — Telephone Encounter (Signed)
Left a message on voicemail for patient to call the office back. 

## 2022-04-17 NOTE — Telephone Encounter (Signed)
No she can continue with the every other day as prescribed

## 2022-04-17 NOTE — Telephone Encounter (Signed)
Left another message on voicemail for patient to call the office back. See second phone  note taken.

## 2022-04-17 NOTE — Telephone Encounter (Signed)
Patient called and stated how many times she should take the medication potassium chloride SA potassium chloride SA (KLOR-CON M) 20 MEQ tablet.

## 2022-04-18 NOTE — Telephone Encounter (Signed)
Spoke to pt in regards to message. Pt verbalized understanding

## 2022-04-18 NOTE — Telephone Encounter (Signed)
Called and advised patient of the message from [facility] to continue taking Potassium pill every other day.  See other phone note for further documentation.

## 2022-04-24 ENCOUNTER — Other Ambulatory Visit: Payer: Self-pay | Admitting: Internal Medicine

## 2022-04-24 ENCOUNTER — Ambulatory Visit: Payer: Medicare Other | Admitting: Podiatry

## 2022-05-01 ENCOUNTER — Telehealth: Payer: Self-pay | Admitting: Nurse Practitioner

## 2022-05-01 NOTE — Telephone Encounter (Signed)
Can we triage. If she needs an appointment please schedule.   Can we find out when her vagina started to hurt vs when she started the medication

## 2022-05-01 NOTE — Telephone Encounter (Signed)
I spoke with pt; pt said started jardiance 2 wks ago. Pt said vaginal pain and burning upon urination with frequency started on 04/26/22 or 04/27/22. Pt last took jardiance on 04/28/22. Pt states has bumps just inside vagina; no fever, no vaginal discharge and no vaginal or perineal itching. No abd or back pain. Pt scheduled appt with Romilda Garret NP on 05/03/22 at 10:40 with UC & ED precautions and pt voiced understanding and appreciative of appt. I had spoken with [facility] and he Oked opening appt on 05/03/22. Sending note to Romilda Garret NP and [facility].

## 2022-05-01 NOTE — Telephone Encounter (Signed)
Patient called in stating that medication gardiance is causing her vagina to hurt. She would like advice as to should she stop it /

## 2022-05-03 ENCOUNTER — Encounter: Payer: Self-pay | Admitting: Nurse Practitioner

## 2022-05-03 ENCOUNTER — Other Ambulatory Visit: Payer: Self-pay

## 2022-05-03 ENCOUNTER — Other Ambulatory Visit (HOSPITAL_COMMUNITY)
Admission: RE | Admit: 2022-05-03 | Discharge: 2022-05-03 | Disposition: A | Discharge status: Home / Self Care | Payer: Medicare Other | Source: Ambulatory Visit | Attending: Nurse Practitioner | Admitting: Nurse Practitioner

## 2022-05-03 ENCOUNTER — Emergency Department (HOSPITAL_COMMUNITY): Payer: Medicare Other

## 2022-05-03 ENCOUNTER — Emergency Department (HOSPITAL_COMMUNITY)
Admission: EM | Admit: 2022-05-03 | Discharge: 2022-05-03 | Disposition: A | Discharge status: Home / Self Care | Payer: Medicare Other | Attending: Emergency Medicine | Admitting: Emergency Medicine

## 2022-05-03 ENCOUNTER — Ambulatory Visit (INDEPENDENT_AMBULATORY_CARE_PROVIDER_SITE_OTHER): Payer: Medicare Other | Admitting: Nurse Practitioner

## 2022-05-03 VITALS — BP 122/74 | HR 38 | Temp 98.6°F | Resp 16 | Ht <= 58 in | Wt 149.4 lb

## 2022-05-03 DIAGNOSIS — S0081XA Abrasion of other part of head, initial encounter: Secondary | ICD-10-CM | POA: Diagnosis not present

## 2022-05-03 DIAGNOSIS — R102 Pelvic and perineal pain: Secondary | ICD-10-CM | POA: Diagnosis not present

## 2022-05-03 DIAGNOSIS — W19XXXA Unspecified fall, initial encounter: Secondary | ICD-10-CM | POA: Diagnosis not present

## 2022-05-03 DIAGNOSIS — Z124 Encounter for screening for malignant neoplasm of cervix: Secondary | ICD-10-CM | POA: Diagnosis not present

## 2022-05-03 DIAGNOSIS — Y9301 Activity, walking, marching and hiking: Secondary | ICD-10-CM | POA: Insufficient documentation

## 2022-05-03 DIAGNOSIS — Z79899 Other long term (current) drug therapy: Secondary | ICD-10-CM | POA: Diagnosis not present

## 2022-05-03 DIAGNOSIS — R001 Bradycardia, unspecified: Secondary | ICD-10-CM | POA: Diagnosis not present

## 2022-05-03 DIAGNOSIS — S0003XA Contusion of scalp, initial encounter: Secondary | ICD-10-CM | POA: Insufficient documentation

## 2022-05-03 DIAGNOSIS — N390 Urinary tract infection, site not specified: Secondary | ICD-10-CM | POA: Insufficient documentation

## 2022-05-03 DIAGNOSIS — R350 Frequency of micturition: Secondary | ICD-10-CM

## 2022-05-03 DIAGNOSIS — Z1151 Encounter for screening for human papillomavirus (HPV): Secondary | ICD-10-CM | POA: Insufficient documentation

## 2022-05-03 DIAGNOSIS — S0993XA Unspecified injury of face, initial encounter: Secondary | ICD-10-CM | POA: Diagnosis present

## 2022-05-03 DIAGNOSIS — Z01419 Encounter for gynecological examination (general) (routine) without abnormal findings: Secondary | ICD-10-CM | POA: Insufficient documentation

## 2022-05-03 DIAGNOSIS — S0093XA Contusion of unspecified part of head, initial encounter: Secondary | ICD-10-CM

## 2022-05-03 DIAGNOSIS — R9431 Abnormal electrocardiogram [ECG] [EKG]: Secondary | ICD-10-CM

## 2022-05-03 LAB — URINALYSIS, ROUTINE W REFLEX MICROSCOPIC
Bilirubin Urine: NEGATIVE
Glucose, UA: 150 mg/dL — AB
Ketones, ur: 5 mg/dL — AB
Nitrite: NEGATIVE
Protein, ur: 100 mg/dL — AB
Specific Gravity, Urine: 1.031 — ABNORMAL HIGH (ref 1.005–1.030)
pH: 5 (ref 5.0–8.0)

## 2022-05-03 LAB — COMPREHENSIVE METABOLIC PANEL
ALT: 16 U/L (ref 0–44)
AST: 26 U/L (ref 15–41)
Albumin: 3.9 g/dL (ref 3.5–5.0)
Alkaline Phosphatase: 79 U/L (ref 38–126)
Anion gap: 10 (ref 5–15)
BUN: 15 mg/dL (ref 8–23)
CO2: 25 mmol/L (ref 22–32)
Calcium: 9.4 mg/dL (ref 8.9–10.3)
Chloride: 105 mmol/L (ref 98–111)
Creatinine, Ser: 0.54 mg/dL (ref 0.44–1.00)
GFR, Estimated: >60 mL/min (ref >60)
Glucose, Bld: 161 mg/dL — ABNORMAL HIGH (ref 70–99)
Potassium: 3.7 mmol/L (ref 3.5–5.1)
Sodium: 140 mmol/L (ref 135–145)
Total Bilirubin: 0.5 mg/dL (ref 0.3–1.2)
Total Protein: 6.8 g/dL (ref 6.5–8.1)

## 2022-05-03 LAB — I-STAT CHEM 8, ED
BUN: 16 mg/dL (ref 8–23)
Calcium, Ion: 1.17 mmol/L (ref 1.15–1.40)
Chloride: 105 mmol/L (ref 98–111)
Creatinine, Ser: 0.5 mg/dL (ref 0.44–1.00)
Glucose, Bld: 160 mg/dL — ABNORMAL HIGH (ref 70–99)
HCT: 38 % (ref 36.0–46.0)
Hemoglobin: 12.9 g/dL (ref 12.0–15.0)
Potassium: 3.5 mmol/L (ref 3.5–5.1)
Sodium: 140 mmol/L (ref 135–145)
TCO2: 24 mmol/L (ref 22–32)

## 2022-05-03 LAB — CBC
HCT: 38.1 % (ref 36.0–46.0)
Hemoglobin: 11.5 g/dL — ABNORMAL LOW (ref 12.0–15.0)
MCH: 23.2 pg — ABNORMAL LOW (ref 26.0–34.0)
MCHC: 30.2 g/dL (ref 30.0–36.0)
MCV: 76.8 fL — ABNORMAL LOW (ref 80.0–100.0)
Platelets: 268 10*3/uL (ref 150–400)
RBC: 4.96 MIL/uL (ref 3.87–5.11)
RDW: 16.8 % — ABNORMAL HIGH (ref 11.5–15.5)
WBC: 17.1 10*3/uL — ABNORMAL HIGH (ref 4.0–10.5)
nRBC: 0 % (ref 0.0–0.2)

## 2022-05-03 MED ORDER — CEPHALEXIN 500 MG PO CAPS: 500.0000 mg | ORAL_CAPSULE | Freq: Two times a day (BID) | ORAL | 0 refills | Status: DC

## 2022-05-03 NOTE — Assessment & Plan Note (Signed)
UA in office was negative.

## 2022-05-03 NOTE — Discharge Instructions (Addendum)
It was our pleasure to provide your ER care today - we hope that you feel better.  Drink plenty of fluids/stay well hydrated. Keep abrasions clean.   Take acetaminophen as need.   Follow up with primary care doctor in the coming week if symptoms fail to improve/resolve.  Return to ER if worse, new/severe pain, chest pain, trouble breathing, abdominal pain, or other concern.

## 2022-05-03 NOTE — Progress Notes (Signed)
Established Patient Office Visit  Subjective   Patient ID: Stephanie Bray, female    DOB: July 31, 1960  Age: 61 y.o. MRN: [id number]  Chief Complaint  Patient presents with   Urinary Frequency   Dysuria      Urinary complaint: Patient reach out to the office with concerns for urinary and vaginal problems. She was triaged. She was recently started on jardiance for her diabetes. She was concerned this was a side effect of the medication. States that she stopped taking it for approx 4 days. States that they have gotten a little better. No vaginal discharge or itching  States that she is hurting in the vaginal area    Review of Systems  Constitutional:  Negative for chills and fever.  Respiratory:  Negative for shortness of breath.   Cardiovascular:  Negative for chest pain.  Gastrointestinal:  Negative for abdominal pain, nausea and vomiting.  Genitourinary:  Positive for frequency. Negative for dysuria.      Objective:     BP 122/74   Pulse (!) 38   Temp 98.6 F (37 C)   Resp 16   Ht '4\' 10"'$  (1.473 m)   Wt 149 lb 6 oz (67.8 kg)   SpO2 96%   BMI 31.22 kg/m    Physical Exam Vitals and nursing note reviewed.  Constitutional:      Appearance: Normal appearance. She is obese.  Cardiovascular:     Rate and Rhythm: Normal rate and regular rhythm.     Heart sounds: Murmur heard.  Pulmonary:     Effort: Pulmonary effort is normal.     Breath sounds: Normal breath sounds.  Abdominal:     General: There is no distension.     Palpations: There is no mass.     Tenderness: There is no abdominal tenderness.     Hernia: A hernia is present.  Neurological:     Mental Status: She is alert.      No results found for any visits on 05/03/22.    The ASCVD Risk score (Arnett DK, et al., 2019) failed to calculate for the following reasons:   The patient has a prior MI or stroke diagnosis    Assessment & Plan:   Problem List Items Addressed This Visit       Other    Bradycardia    Bradycardic on pulse oximetry and exam.  EKG showed normal pulse rate but with ectopic beats patient does have history of VSD along with Takotsubo's.  Ambulatory referral to cardiology      Relevant Orders   EKG 12-Lead (Completed)   Ambulatory referral to Cardiology   Vaginal pain - Primary    Has been improving no overt finding on physical exam      Relevant Orders   WET PREP BY MOLECULAR PROBE   Urinary frequency    UA in office was negative.      Relevant Orders   WET PREP BY MOLECULAR PROBE   Urine Culture   Abnormal EKG    Normal rate with frequent PVCs.  Ambulatory referral to cardiology.      Relevant Orders   Ambulatory referral to Cardiology   Screening for cervical cancer    Has not had a Pap smear in "a long time".  Ongoing pelvic exam when due Pap smear.      Relevant Orders   Cytology - PAP([place])    Return in about 3 months (around 08/02/2022) for DM recheck .  Romilda Garret, NP

## 2022-05-03 NOTE — Patient Instructions (Signed)
Nice to see you today I will be in touch with the labs once I have them You can start taking the Jardiance again

## 2022-05-03 NOTE — Assessment & Plan Note (Signed)
Normal rate with frequent PVCs.  Ambulatory referral to cardiology.

## 2022-05-03 NOTE — Assessment & Plan Note (Signed)
Bradycardic on pulse oximetry and exam.  EKG showed normal pulse rate but with ectopic beats patient does have history of VSD along with Takotsubo's.  Ambulatory referral to cardiology

## 2022-05-03 NOTE — Assessment & Plan Note (Signed)
Has not had a Pap smear in "a long time".  Ongoing pelvic exam when due Pap smear.
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
## 2022-05-03 NOTE — ED Notes (Signed)
Patient walked to the bathroom with minimal assistance.  
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
## 2022-05-03 NOTE — Assessment & Plan Note (Signed)
Has been improving no overt finding on physical exam

## 2022-05-04 LAB — URINE CULTURE
MICRO NUMBER:: 14370414
SPECIMEN QUALITY:: ADEQUATE

## 2022-05-07 LAB — WET PREP BY MOLECULAR PROBE
Candida species: NOT DETECTED
Gardnerella vaginalis: NOT DETECTED
MICRO NUMBER:: 14370412
SPECIMEN QUALITY:: ADEQUATE
Trichomonas vaginosis: NOT DETECTED

## 2022-05-07 LAB — CYTOLOGY - PAP
Comment: NEGATIVE
Diagnosis: NEGATIVE
High risk HPV: NEGATIVE

## 2022-05-15 ENCOUNTER — Telehealth: Payer: Self-pay | Admitting: Cardiology

## 2022-05-15 NOTE — Telephone Encounter (Signed)
Patient requesting to switch from Dr. Harrell Gave to Dr. Rockey Situ in [place].

## 2022-05-15 NOTE — Telephone Encounter (Signed)
Ok by me

## 2022-05-29 ENCOUNTER — Encounter: Payer: Self-pay | Admitting: Podiatry

## 2022-05-29 ENCOUNTER — Ambulatory Visit (INDEPENDENT_AMBULATORY_CARE_PROVIDER_SITE_OTHER): Payer: Medicare Other | Admitting: Podiatry

## 2022-05-29 VITALS — BP 175/58 | HR 83

## 2022-05-29 DIAGNOSIS — D2372 Other benign neoplasm of skin of left lower limb, including hip: Secondary | ICD-10-CM

## 2022-05-29 DIAGNOSIS — B351 Tinea unguium: Secondary | ICD-10-CM | POA: Diagnosis not present

## 2022-05-29 DIAGNOSIS — E119 Type 2 diabetes mellitus without complications: Secondary | ICD-10-CM | POA: Diagnosis not present

## 2022-05-29 DIAGNOSIS — M79676 Pain in unspecified toe(s): Secondary | ICD-10-CM | POA: Diagnosis not present

## 2022-05-29 NOTE — Progress Notes (Signed)
She presents today chief complaint of painful elongated toenails and calluses bilateral.  Objective: Pulses are palpable.  Edema to the right lower extremity.  Toenails are long thick yellow dystrophic onychomycotic and benign skin lesion medial aspect of the first metatarsophalangeal joint.  Plan: Debridement of toenails 1 through 5 bilateral debridement of reactive keratosis.

## 2022-07-04 ENCOUNTER — Other Ambulatory Visit: Payer: Self-pay | Admitting: Cardiology

## 2022-07-04 NOTE — Telephone Encounter (Signed)
Rx request sent to pharmacy.  

## 2022-08-13 ENCOUNTER — Ambulatory Visit: Payer: Medicare Other | Admitting: Cardiovascular Disease

## 2022-08-14 ENCOUNTER — Ambulatory Visit (INDEPENDENT_AMBULATORY_CARE_PROVIDER_SITE_OTHER): Payer: Medicare Other | Admitting: Nurse Practitioner

## 2022-08-14 ENCOUNTER — Encounter: Payer: Self-pay | Admitting: Nurse Practitioner

## 2022-08-14 VITALS — BP 128/84 | HR 69 | Temp 99.1°F | Resp 16 | Ht <= 58 in | Wt 145.5 lb

## 2022-08-14 DIAGNOSIS — R232 Flushing: Secondary | ICD-10-CM | POA: Insufficient documentation

## 2022-08-14 DIAGNOSIS — E119 Type 2 diabetes mellitus without complications: Secondary | ICD-10-CM

## 2022-08-14 DIAGNOSIS — G43009 Migraine without aura, not intractable, without status migrainosus: Secondary | ICD-10-CM

## 2022-08-14 LAB — POCT GLYCOSYLATED HEMOGLOBIN (HGB A1C): Hemoglobin A1C: 6.2 % — AB (ref 4.0–5.6)

## 2022-08-14 MED ORDER — SUMATRIPTAN SUCCINATE 25 MG PO TABS: 25.0000 mg | ORAL_TABLET | ORAL | 0 refills | Status: AC | PRN

## 2022-08-14 MED ORDER — KETOROLAC TROMETHAMINE 30 MG/ML IJ SOLN
30.0000 mg | Freq: Once | INTRAMUSCULAR | Status: AC
  Administered 2022-08-14: 30 mg via INTRAMUSCULAR

## 2022-08-14 NOTE — Assessment & Plan Note (Signed)
Currently on Jardiance only.  Does not check blood sugar at home.  A1c of 6.2% today.

## 2022-08-14 NOTE — Assessment & Plan Note (Signed)
Patient states hot flashes day and nighttime.  Not waking her up.  Do not think she would be a good candidate for HRT given comorbidities.  Patient currently on sertraline so would not be venlafaxine we will have to reach out to her mental health provider to see if this is a switch to be willing to do

## 2022-08-14 NOTE — Patient Instructions (Addendum)
Nice to see you today NO NSAIDS for 12 hours: this includes ibuprofen, motrin, aleve, naproxen, BC/goody powders I have sent in the medication that you can take for the migraines if you need it. It can make you sleepy Follow up with me in 3 months for you physical and labs

## 2022-08-14 NOTE — Assessment & Plan Note (Signed)
History of intractable chronic migraines.  States that they have tried medication in the past that was unaffected.  Patient does have some relief with ibuprofen has not taken any medication today will give Toradol 30 mg IM x 1 dose.  Avoid NSAIDs over the next 12 hours.  Can use Tylenol as needed.  Will send in sumatriptan  precautions reviewed

## 2022-08-14 NOTE — Progress Notes (Signed)
Established Patient Office Visit  Subjective   Patient ID: Stephanie Bray, female    DOB: [birth year]  Age: 62 y.o. MRN: [id number]  Chief Complaint  Patient presents with   Diabetes   Migraine   Hot Flashes      DM2: Patient currently maintained on Jardiance 10 mg.  Patient's A1c is 6.1% today.   Check sugars? Does not  Hyperglycemia? Hypoglycemia? States that she is eating a beet pill and watching what she is eating   Migraine: history of migrianes when she was younger. States that she is getting once a week. Bilateral temples. Sharp pain. States no light or sound sensitity  States that she has been taking ibuprofen that helps but does not abort  Has not taken any medications today   Hot flashes: statea that she is eating sugar free popscilies. States that they are everyday.  Day and night. States they do not wake her up at night.     Review of Systems  Constitutional:  Negative for chills and fever.  Respiratory:  Negative for shortness of breath.   Cardiovascular:  Negative for chest pain.  Neurological:  Positive for headaches.  Psychiatric/Behavioral:  Negative for hallucinations and suicidal ideas.       Objective:     BP 128/84   Pulse 69   Temp 99.1 F (37.3 C)   Resp 16   Ht 4\' 10"  (1.473 m)   Wt 145 lb 8 oz (66 kg)   SpO2 97%   BMI 30.41 kg/m    Physical Exam Vitals and nursing note reviewed.  Constitutional:      Appearance: Normal appearance.  Eyes:     Extraocular Movements: Extraocular movements intact.     Pupils: Pupils are equal, round, and reactive to light.  Cardiovascular:     Rate and Rhythm: Normal rate and regular rhythm.     Heart sounds: Murmur heard.  Pulmonary:     Effort: Pulmonary effort is normal.     Breath sounds: Normal breath sounds.  Musculoskeletal:     Cervical back: No tenderness.     Right lower leg: No edema.     Left lower leg: No edema.  Neurological:     General: No focal deficit present.     Mental  Status: She is alert.     Deep Tendon Reflexes:     Reflex Scores:      Bicep reflexes are 2+ on the right side and 2+ on the left side.      Patellar reflexes are 2+ on the right side and 2+ on the left side.    Comments: Bilateral upper and lower extremity strength 5/5      Results for orders placed or performed in visit on 08/14/22  POCT glycosylated hemoglobin (Hb A1C)  Result Value Ref Range   Hemoglobin A1C 6.2 (A) 4.0 - 5.6 %   HbA1c POC (<> result, manual entry)     HbA1c, POC (prediabetic range)     HbA1c, POC (controlled diabetic range)        The ASCVD Risk score (Arnett DK, et al., 2019) failed to calculate for the following reasons:   The patient has a prior MI or stroke diagnosis    Assessment & Plan:   Problem List Items Addressed This Visit       Cardiovascular and Mediastinum   Migraine without aura and without status migrainosus, not intractable    History of intractable chronic migraines.  States that  they have tried medication in the past that was unaffected.  Patient does have some relief with ibuprofen has not taken any medication today will give Toradol 30 mg IM x 1 dose.  Avoid NSAIDs over the next 12 hours.  Can use Tylenol as needed.  Will send in sumatriptan  precautions reviewed      Relevant Medications   SUMAtriptan (IMITREX) 25 MG tablet   Hot flashes    Patient states hot flashes day and nighttime.  Not waking her up.  Do not think she would be a good candidate for HRT given comorbidities.  Patient currently on sertraline so would not be venlafaxine we will have to reach out to her mental health provider to see if this is a switch to be willing to do        Endocrine   Diabetes mellitus without complication - Primary    Currently on Jardiance only.  Does not check blood sugar at home.  A1c of 6.2% today.      Relevant Orders   POCT glycosylated hemoglobin (Hb A1C) (Completed)    Return in about 3 months (around 11/13/2022) for CPE and  Labs.    Audria Nine, NP

## 2022-08-28 ENCOUNTER — Ambulatory Visit: Payer: Medicare Other | Admitting: Podiatry

## 2022-09-03 ENCOUNTER — Ambulatory Visit: Payer: Medicare Other | Admitting: Medical

## 2022-09-10 ENCOUNTER — Telehealth: Payer: Self-pay | Admitting: Podiatry

## 2022-09-10 NOTE — Telephone Encounter (Signed)
Pt left message today at 807am wanting to confirm her appt for tomorrow and she will be there.  I called pt back and left message that I have confirmed her appt and to call if needed.

## 2022-09-11 ENCOUNTER — Ambulatory Visit (INDEPENDENT_AMBULATORY_CARE_PROVIDER_SITE_OTHER): Payer: Medicare Other | Admitting: Podiatry

## 2022-09-11 DIAGNOSIS — D2372 Other benign neoplasm of skin of left lower limb, including hip: Secondary | ICD-10-CM

## 2022-09-11 DIAGNOSIS — E119 Type 2 diabetes mellitus without complications: Secondary | ICD-10-CM | POA: Diagnosis not present

## 2022-09-11 DIAGNOSIS — B351 Tinea unguium: Secondary | ICD-10-CM

## 2022-09-11 DIAGNOSIS — M79676 Pain in unspecified toe(s): Secondary | ICD-10-CM

## 2022-09-11 DIAGNOSIS — L84 Corns and callosities: Secondary | ICD-10-CM | POA: Diagnosis not present

## 2022-09-11 NOTE — Progress Notes (Signed)
She presents today for diabetic footcare states that her nails and calluses need to be trimmed her last hemoglobin A1c was at 6.2.  Objective: Vital signs stable alert oriented x 3 there is no erythema edema cellulitis drainage or odor toenails are long thick yellow dystrophic onychomycotic multiple benign skin lesions plantar aspect of the bilateral foot no open lesions or wounds.  Assessment: Pain in limb secondary to onychomycosis pain in limb secondary benign skin lesions.  Plan: Debridement of benign skin bilateral debridement of toenails 1 through 5 bilateral.

## 2022-09-17 ENCOUNTER — Ambulatory Visit: Payer: Medicare Other | Attending: Cardiovascular Disease | Admitting: Medical

## 2022-09-17 ENCOUNTER — Encounter: Payer: Self-pay | Admitting: Medical

## 2022-09-17 VITALS — BP 162/70 | HR 65 | Ht <= 58 in | Wt 145.2 lb

## 2022-09-17 DIAGNOSIS — Q210 Ventricular septal defect: Secondary | ICD-10-CM | POA: Insufficient documentation

## 2022-09-17 DIAGNOSIS — I10 Essential (primary) hypertension: Secondary | ICD-10-CM | POA: Insufficient documentation

## 2022-09-17 DIAGNOSIS — E782 Mixed hyperlipidemia: Secondary | ICD-10-CM | POA: Diagnosis present

## 2022-09-17 DIAGNOSIS — I5181 Takotsubo syndrome: Secondary | ICD-10-CM | POA: Diagnosis present

## 2022-09-17 MED ORDER — AMLODIPINE BESYLATE 10 MG PO TABS: 10.0000 mg | ORAL_TABLET | Freq: Every day | ORAL | 3 refills | Status: DC

## 2022-09-17 NOTE — Patient Instructions (Signed)
Medication Instructions:  Your physician recommends the following medication changes.  INCREASE: Amlodipine to 10 mg by mouth daily   *If you need a refill on your cardiac medications before your next appointment, please call your pharmacy*   Lab Work: None ordered today   Testing/Procedures: None ordered today   Follow-Up: At [facility], you and your health needs are our priority.  As part of our continuing mission to provide you with exceptional heart care, we have created designated Provider Care Teams.  These Care Teams include your primary Cardiologist (physician) and Advanced Practice Providers (APPs -  Physician Assistants and Nurse Practitioners) who all work together to provide you with the care you need, when you need it.  We recommend signing up for the patient portal called "MyChart".  Sign up information is provided on this After Visit Summary.  MyChart is used to connect with patients for Virtual Visits (Telemedicine).  Patients are able to view lab/test results, encounter notes, upcoming appointments, etc.  Non-urgent messages can be sent to your provider as well.   To learn more about what you can do with MyChart, go to [email or web address].    Your next appointment:   1 year(s)  Provider:   You may see Julien Nordmann, MD or one of the following Advanced Practice Providers on your designated Care Team:   Nicolasa Ducking, NP Eula Listen, PA-C Cadence Fransico Michael, PA-C Charlsie Quest, NP
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
## 2022-10-21 ENCOUNTER — Other Ambulatory Visit: Payer: Self-pay | Admitting: Nurse Practitioner

## 2022-10-23 ENCOUNTER — Encounter: Payer: Self-pay | Admitting: Nurse Practitioner

## 2022-10-23 ENCOUNTER — Ambulatory Visit (INDEPENDENT_AMBULATORY_CARE_PROVIDER_SITE_OTHER): Payer: Medicare Other | Admitting: Nurse Practitioner

## 2022-10-23 VITALS — BP 130/62 | HR 65 | Temp 97.8°F | Resp 16 | Ht <= 58 in | Wt 144.0 lb

## 2022-10-23 DIAGNOSIS — K219 Gastro-esophageal reflux disease without esophagitis: Secondary | ICD-10-CM | POA: Diagnosis not present

## 2022-10-23 DIAGNOSIS — H9311 Tinnitus, right ear: Secondary | ICD-10-CM

## 2022-10-23 DIAGNOSIS — K439 Ventral hernia without obstruction or gangrene: Secondary | ICD-10-CM | POA: Insufficient documentation

## 2022-10-23 DIAGNOSIS — J0100 Acute maxillary sinusitis, unspecified: Secondary | ICD-10-CM

## 2022-10-23 MED ORDER — AMOXICILLIN-POT CLAVULANATE 875-125 MG PO TABS: 1.0000 | ORAL_TABLET | Freq: Two times a day (BID) | ORAL | 0 refills | Status: AC

## 2022-10-23 MED ORDER — OMEPRAZOLE 40 MG PO CPDR: 40.0000 mg | DELAYED_RELEASE_CAPSULE | Freq: Every day | ORAL | 0 refills | Status: DC

## 2022-10-23 NOTE — Assessment & Plan Note (Signed)
Given length of symptoms and physical exam we will treat with Augmentin 875-125 mg twice daily for 7 days.

## 2022-10-23 NOTE — Progress Notes (Signed)
Acute Office Visit  Subjective:     Patient ID: Stephanie Bray, female    DOB: 23-Aug-1960, 62 y.o.   MRN: [id number]  Chief Complaint  Patient presents with   Cough    X weeks No fever   Gastroesophageal Reflux   Tinnitus     Patient is in today for multiple complaints with a history of TIA, takotsubo cardiolyopaty, THN, GERD, DM, Swizures  Cough: states that she has 3-4 weeks states that her oldest son was sick and came over Coid vaccine: utd Flu vaccine utd  Has tried mucinex that has not helped    GERD: states that she does take a green acid pill. States that she takes it daily. States that she is having break through every day. States that she has used tums that has helped.  States that she does have to avoid certain foods. States that she does not eat at night time   Tinnitus: states that the ringing has been about aweek and the right ear. States that it is coming and going. States that no inury or sudden loud noise  Review of Systems  Constitutional:  Positive for malaise/fatigue. Negative for chills and fever.  HENT:  Positive for sore throat and tinnitus. Negative for ear discharge and ear pain.   Respiratory:  Positive for cough. Negative for sputum production and shortness of breath.   Gastrointestinal:  Negative for abdominal pain, constipation, diarrhea, nausea and vomiting.  Musculoskeletal:  Negative for joint pain and myalgias.  Neurological:  Positive for headaches (baseline).        Objective:    BP 130/62   Pulse 65   Temp 97.8 F (36.6 C)   Resp 16   Ht 4\' 10"  (1.473 m)   Wt 144 lb (65.3 kg)   SpO2 98%   BMI 30.10 kg/m  BP Readings from Last 3 Encounters:  10/23/22 130/62  09/17/22 (!) 162/70  08/14/22 128/84   Wt Readings from Last 3 Encounters:  10/23/22 144 lb (65.3 kg)  09/17/22 145 lb 3.2 oz (65.9 kg)  08/14/22 145 lb 8 oz (66 kg)      Physical Exam Vitals and nursing note reviewed.  Constitutional:      Appearance: Normal  appearance.  HENT:     Right Ear: Tympanic membrane, ear canal and external ear normal.     Left Ear: Tympanic membrane, ear canal and external ear normal.     Nose:     Right Sinus: No maxillary sinus tenderness or frontal sinus tenderness.     Left Sinus: Maxillary sinus tenderness present. No frontal sinus tenderness.     Mouth/Throat:     Mouth: Mucous membranes are moist.     Pharynx: Oropharynx is clear.  Cardiovascular:     Rate and Rhythm: Normal rate and regular rhythm.     Heart sounds: Normal heart sounds.  Pulmonary:     Effort: Pulmonary effort is normal.     Breath sounds: Normal breath sounds.  Abdominal:     General: Bowel sounds are normal. There is no distension.     Palpations: There is no mass.     Tenderness: There is no abdominal tenderness.     Hernia: A hernia is present.    Lymphadenopathy:     Cervical: No cervical adenopathy.  Neurological:     Mental Status: She is alert.     No results found for any visits on 10/23/22.      Assessment &  Plan:   Problem List Items Addressed This Visit       Respiratory   Acute non-recurrent maxillary sinusitis - Primary    Given length of symptoms and physical exam we will treat with Augmentin 875-125 mg twice daily for 7 days.      Relevant Medications   amoxicillin-clavulanate (AUGMENTIN) 875-125 MG tablet     Digestive   GERD (gastroesophageal reflux disease)    Submit omeprazole 20 mg in the past still on it.  She having breakthrough heartburn almost daily will increase to omeprazole 40 mg      Relevant Medications   omeprazole (PRILOSEC) 40 MG capsule     Other   Ventral hernia without obstruction or gangrene    Patient has bilateral lower abdomen hernia that is not incarcerated.  Signs been reviewed when to seek emergent healthcare.  Will do watchful waiting currently      Tinnitus of right ear    Right-sided acute intermittent.  Ear exam benign in office.  Patient cannot tolerate Flonase  will do Augmentin for sinus infection see if this improves       Meds ordered this encounter  Medications   omeprazole (PRILOSEC) 40 MG capsule    Sig: Take 1 capsule (40 mg total) by mouth daily.    Dispense:  90 capsule    Refill:  0    Order Specific Question:   Supervising Provider    Answer:   Roxy Manns A [[id number]]   amoxicillin-clavulanate (AUGMENTIN) 875-125 MG tablet    Sig: Take 1 tablet by mouth 2 (two) times daily for 7 days.    Dispense:  14 tablet    Refill:  0    Order Specific Question:   Supervising Provider    Answer:   Roxy Manns A [[id number]]    Return in about 4 weeks (around 11/20/2022) for DM recheck, CPE and Labs.  Audria Nine, NP

## 2022-10-23 NOTE — Assessment & Plan Note (Signed)
Right-sided acute intermittent.  Ear exam benign in office.  Patient cannot tolerate Flonase will do Augmentin for sinus infection see if this improves

## 2022-10-23 NOTE — Patient Instructions (Signed)
Nice to see you today I have sent in a antibiotic for the sinus infection I have increased the omeprazole from 20mg  to 40mg .  Follow up with me in 1 month for a physical and diabetes recheck

## 2022-10-23 NOTE — Assessment & Plan Note (Signed)
Patient has bilateral lower abdomen hernia that is not incarcerated.  Signs been reviewed when to seek emergent healthcare.  Will do watchful waiting currently

## 2022-10-23 NOTE — Assessment & Plan Note (Signed)
Submit omeprazole 20 mg in the past still on it.  She having breakthrough heartburn almost daily will increase to omeprazole 40 mg

## 2022-10-31 ENCOUNTER — Ambulatory Visit (INDEPENDENT_AMBULATORY_CARE_PROVIDER_SITE_OTHER): Payer: Medicare Other

## 2022-10-31 VITALS — Ht <= 58 in | Wt 142.0 lb

## 2022-10-31 DIAGNOSIS — Z0000 Encounter for general adult medical examination without abnormal findings: Secondary | ICD-10-CM | POA: Diagnosis not present

## 2022-10-31 DIAGNOSIS — Z1231 Encounter for screening mammogram for malignant neoplasm of breast: Secondary | ICD-10-CM

## 2022-10-31 NOTE — Progress Notes (Signed)
Subjective:   Stephanie Bray is a 62 y.o. female who presents for Medicare Annual (Subsequent) preventive examination.  Visit Complete: Virtual  I connected with  [name] on 10/31/22 by a audio enabled telemedicine application and verified that I am speaking with the correct person using two identifiers.  Patient Location: Home  Provider Location: Office/Clinic  I discussed the limitations of evaluation and management by telemedicine. The patient expressed understanding and agreed to proceed.  Review of Systems      Cardiac Risk Factors include: advanced age (>67men, >40 women);hypertension;diabetes mellitus;sedentary lifestyle     Objective:    Today's Vitals   10/31/22 1335  Weight: 142 lb (64.4 kg)  Height: 4\' 10"  (1.473 m)   Body mass index is 29.68 kg/m.     10/31/2022    1:45 PM 05/03/2022    6:17 PM 06/12/2021    8:19 AM 05/18/2021   11:29 AM 01/12/2020    1:24 AM 12/01/2017    5:55 AM 11/29/2017   10:26 PM  Advanced Directives  Does Patient Have a Medical Advance Directive? Yes No No No No  No  Type of Estate agent of [place];Living will        Copy of Healthcare Power of Attorney in Chart? No - copy requested        Would patient like information on creating a medical advance directive?     No - Patient declined No - Patient declined     Current Medications (verified) Outpatient Encounter Medications as of 10/31/2022  Medication Sig   amLODipine (NORVASC) 10 MG tablet Take 1 tablet (10 mg total) by mouth daily.   cetirizine (ZYRTEC) 10 MG tablet TAKE ONE TABLET BY MOUTH DAILY   cholecalciferol (VITAMIN D3) 25 MCG (1000 UT) tablet Take 1,000 Units by mouth daily.   Continuous Blood Gluc Sensor (FREESTYLE LIBRE 3 SENSOR) MISC 2 each by Does not apply route every 14 (fourteen) days. Place 1 sensor on the skin every 14 days. Use to check glucose continuously   Continuous Blood Gluc Transmit (DEXCOM G6 TRANSMITTER) MISC 1 Application by  Does not apply route as directed.   FEROSUL 325 (65 Fe) MG tablet TAKE ONE TABLET BY MOUTH EVERY OTHER DAY   furosemide (LASIX) 20 MG tablet TAKE ONE TABLET BY MOUTH DAILY   JARDIANCE 10 MG TABS tablet TAKE ONE TABLET BY MOUTH DAILY BEFORE BREAKFAST   Misc Natural Products (BEET ROOT PO) Take 1 tablet by mouth 2 (two) times daily.   omeprazole (PRILOSEC) 40 MG capsule Take 1 capsule (40 mg total) by mouth daily.   potassium chloride SA (KLOR-CON M) 20 MEQ tablet TAKE ONE TABLET BY MOUTH DAILY   rosuvastatin (CRESTOR) 20 MG tablet TAKE ONE TABLET BY MOUTH DAILY   sertraline (ZOLOFT) 50 MG tablet Take 50 mg by mouth daily.   SUMAtriptan (IMITREX) 25 MG tablet Take 1 tablet (25 mg total) by mouth every 2 (two) hours as needed for migraine. May repeat in 2 hours if headache persists or recurs. No more than 2 tablets in 24 hours   traZODone (DESYREL) 100 MG tablet Take 300 mg by mouth at bedtime.   nitroGLYCERIN (NITROSTAT) 0.4 MG SL tablet Place 1 tablet (0.4 mg total) under the tongue every 5 (five) minutes as needed for chest pain.   Omega-3 Fatty Acids (FISH OIL) 1000 MG CAPS Take 1,000 mg by mouth daily. (Patient not taking: Reported on 10/31/2022)   No facility-administered encounter medications on  file as of 10/31/2022.    Allergies (verified) Flonase [fluticasone propionate], Metformin and related, Other, and Ace inhibitors   History: Past Medical History:  Diagnosis Date   Anxiety    Arthritis    "left hip" (04/25/2014)   Chest pain    a. 04/2014 MV: small anteroapical perfusion defect - likely breast attenuation->low risk.   Chronic bronchitis (HCC)    "get it mostly q yr" (04/25/2014)   Depression    Essential hypertension    GERD (gastroesophageal reflux disease)    Heart murmur    High cholesterol    Iron deficiency anemia    Migraine    "weekly, at least" (04/25/2014)   Myocardial infarction [facility])    PTSD (post-traumatic stress disorder)    Stroke [facility])    VSD  (ventricular septal defect)    a. 04/2014 Echo: EF 55-60%, PASP , small restrictive either supracristal or perimembranous VSD w/o PAH.   Past Surgical History:  Procedure Laterality Date   CHOLECYSTECTOMY     COLONOSCOPY WITH PROPOFOL N/A 06/12/2021   Procedure: COLONOSCOPY WITH PROPOFOL;  Surgeon: Midge Minium, MD;  Location: [facility] ENDOSCOPY;  Service: Endoscopy;  Laterality: N/A;   RIGHT/LEFT HEART CATH AND CORONARY ANGIOGRAPHY N/A 12/01/2017   Procedure: RIGHT/LEFT HEART CATH AND CORONARY ANGIOGRAPHY;  Surgeon: Marykay Lex, MD;  Location: [facility] INVASIVE CV LAB;  Service: Cardiovascular;  Laterality: N/A;   VSD REPAIR  ~ 1963   [facility]/notes 04/25/2014   Family History  Problem Relation Age of Onset   COPD Mother    Hypertension Father    Hyperlipidemia Father    Social History   Socioeconomic History   Marital status: Divorced    Spouse name: Not on file   Number of children: 3   Years of education: Not on file   Highest education level: Not on file  Occupational History   Occupation: Disabled  Tobacco Use   Smoking status: Never   Smokeless tobacco: Never  Vaping Use   Vaping Use: Never used  Substance and Sexual Activity   Alcohol use: No   Drug use: No   Sexual activity: Not Currently  Other Topics Concern   Not on file  Social History Narrative   Lives in [place] with her three sons.    Hobbies: reads and draw    On disability due to anxiety    Social Determinants of Health   Financial Resource Strain: Low Risk  (10/31/2022)   Overall Financial Resource Strain (CARDIA)    Difficulty of Paying Living Expenses: Not hard at all  Food Insecurity: No Food Insecurity (10/31/2022)   Hunger Vital Sign    Worried About Running Out of Food in the Last Year: Never true    Ran Out of Food in the Last Year: Never true  Transportation Needs: No Transportation Needs (10/31/2022)   PRAPARE - Administrator, Civil Service (Medical): No    Lack of  Transportation (Non-Medical): No  Physical Activity: Insufficiently Active (10/31/2022)   Exercise Vital Sign    Days of Exercise per Week: 7 days    Minutes of Exercise per Session: 20 min  Stress: No Stress Concern Present (10/31/2022)   [employer] of Occupational Health - Occupational Stress Questionnaire    Feeling of Stress : Not at all  Social Connections: Moderately Isolated (10/31/2022)   Social Connection and Isolation Panel [NHANES]    Frequency of Communication with Friends and Family: More than three times a week  Frequency of Social Gatherings with Friends and Family: More than three times a week    Attends Religious Services: More than 4 times per year    Active Member of [employer] or Organizations: No    Attends Engineer, structural: Never    Marital Status: Divorced    Tobacco Counseling Counseling given: Not Answered   Clinical Intake:  Pre-visit preparation completed: Yes  Pain : No/denies pain     BMI - recorded: 29.68 Nutritional Risks: None Diabetes: Yes CBG done?: No Did pt. bring in CBG monitor from home?: No  How often do you need to have someone help you when you read instructions, pamphlets, or other written materials from your doctor or pharmacy?: 1 - Never  Interpreter Needed?: No  Information entered by :: C.Avantae Bither LPN   Activities of Daily Living    10/31/2022    1:50 PM  In your present state of health, do you have any difficulty performing the following activities:  Hearing? 0  Vision? 0  Difficulty concentrating or making decisions? 1  Comment a little  Walking or climbing stairs? 1  Comment due to anxiety  Dressing or bathing? 0  Doing errands, shopping? 0  Preparing Food and eating ? N  Using the Toilet? N  In the past six months, have you accidently leaked urine? Y  Comment occasional wears pads  Do you have problems with loss of bowel control? N  Managing your Medications? N  Managing your Finances? N   Housekeeping or managing your Housekeeping? N    Patient Care Team: Eden Emms, NP as PCP - General (Nurse Practitioner) Antonieta Iba, MD as PCP - Cardiology (Cardiology)  Indicate any recent Medical Services you may have received from other than [facility] providers in the past year (date may be approximate).     Assessment:   This is a routine wellness examination for [place].  Hearing/Vision screen Hearing Screening - Comments:: No issues Vision Screening - Comments:: Glasses - [facility] - last exam 2 years ago. Pt will call for eye exam.  Dietary issues and exercise activities discussed:     Goals Addressed             This Visit's Progress    Patient Stated       Eat healthy and walk more.       Depression Screen    10/31/2022    1:44 PM 05/18/2021   11:18 AM 05/15/2021   10:35 AM 07/10/2015   11:07 AM 12/20/2014   10:20 AM 09/20/2014   10:50 AM  PHQ 2/9 Scores  PHQ - 2 Score 0 0 1 0 0 0    Fall Risk    10/31/2022    1:49 PM 08/14/2022    2:36 PM 05/18/2021   11:28 AM 05/15/2021   10:35 AM 12/20/2014   10:20 AM  Fall Risk   Falls in the past year? 0 0 0 0 No  Number falls in past yr: 0 0 0 0   Injury with Fall? 0 0 0 0   Risk for fall due to : No Fall Risks No Fall Risks No Fall Risks No Fall Risks   Follow up Falls prevention discussed;Falls evaluation completed Falls evaluation completed Falls evaluation completed Falls evaluation completed     MEDICARE RISK AT HOME:  Medicare Risk at Home - 10/31/22 1351     Any stairs in or around the home? No    If so, are there any  without handrails? No    Home free of loose throw rugs in walkways, pet beds, electrical cords, etc? Yes    Adequate lighting in your home to reduce risk of falls? Yes    Life alert? No    Use of a cane, walker or w/c? No    Grab bars in the bathroom? No    Shower chair or bench in shower? No    Elevated toilet seat or a handicapped toilet? No             TIMED UP AND  GO:  Was the test performed?  No    Cognitive Function:        10/31/2022    1:52 PM 05/18/2021   11:29 AM  6CIT Screen  What Year? 0 points 0 points  What month? 0 points 0 points  What time? 0 points 0 points  Count back from 20 0 points 0 points  Months in reverse 0 points 0 points  Repeat phrase 10 points 0 points  Total Score 10 points 0 points    Immunizations Immunization History  Administered Date(s) Administered   Influenza Split 02/03/2014   Influenza Whole 03/05/2006   Influenza, Seasonal, Injecte, Preservative Fre 02/09/2014, 01/17/2015   Influenza,inj,Quad PF,6+ Mos 01/10/2022   Influenza,inj,quad, With Preservative 02/05/2016, 01/24/2017, 01/26/2018   Influenza-Unspecified 02/03/2014   Moderna Sars-Covid-2 Vaccination 01/11/2020, 02/08/2020   Pneumococcal Polysaccharide-23 07/19/2016   Td 05/06/2002, 01/06/2019    TDAP status: Up to date  Flu Vaccine status: Up to date  Pneumococcal vaccine status: Due, Education has been provided regarding the importance of this vaccine. Advised may receive this vaccine at local pharmacy or Health Dept. Aware to provide a copy of the vaccination record if obtained from local pharmacy or Health Dept. Verbalized acceptance and understanding.  Covid-19 vaccine status: Information provided on how to obtain vaccines.   Qualifies for Shingles Vaccine? Yes   Zostavax completed No   Shingrix Completed?: No.    Education has been provided regarding the importance of this vaccine. Patient has been advised to call insurance company to determine out of pocket expense if they have not yet received this vaccine. Advised may also receive vaccine at local pharmacy or Health Dept. Verbalized acceptance and understanding.  Screening Tests Health Maintenance  Topic Date Due   FOOT EXAM  Never done   OPHTHALMOLOGY EXAM  Never done   Zoster Vaccines- Shingrix (1 of 2) Never done   MAMMOGRAM  01/20/2011   COVID-19 Vaccine (3 - Moderna  risk series) 03/07/2020   INFLUENZA VACCINE  12/05/2022   Diabetic kidney evaluation - Urine ACR  01/11/2023   HEMOGLOBIN A1C  02/13/2023   Diabetic kidney evaluation - eGFR measurement  05/04/2023   Medicare Annual Wellness (AWV)  10/31/2023   PAP SMEAR-Modifier  05/03/2025   DTaP/Tdap/Td (3 - Tdap) 01/05/2029   Colonoscopy  06/13/2031   Hepatitis C Screening  Completed   HIV Screening  Completed   HPV VACCINES  Aged Out    Health Maintenance  Health Maintenance Due  Topic Date Due   FOOT EXAM  Never done   OPHTHALMOLOGY EXAM  Never done   Zoster Vaccines- Shingrix (1 of 2) Never done   MAMMOGRAM  01/20/2011   COVID-19 Vaccine (3 - Moderna risk series) 03/07/2020    Colorectal cancer screening: Type of screening: Colonoscopy. Completed 06/12/21. Repeat every 10 years  Mammogram status: Ordered 10/31/22. Pt provided with contact info and advised to call to schedule appt.  Bone Scan - Will discuss with PCP at next OV  Lung Cancer Screening: (Low Dose CT Chest recommended if Age 68-80 years, 20 pack-year currently smoking OR have quit w/in 15years.) does not qualify.   Lung Cancer Screening Referral: no  Additional Screening:  Hepatitis C Screening: does qualify; Completed 05/15/21  Vision Screening: Recommended annual ophthalmology exams for early detection of glaucoma and other disorders of the eye. Is the patient up to date with their annual eye exam?   last eye exam 2 years ago, pt stated will call for appointment. Who is the provider or what is the name of the office in which the patient attends annual eye exams? [facility] If pt is not established with a provider, would they like to be referred to a provider to establish care? Yes .   Dental Screening: Recommended annual dental exams for proper oral hygiene  Diabetic Foot Exam: Diabetic Foot Exam: Completed 09/2022 per pt.  Community Resource Referral / Chronic Care Management: CRR required this visit?  No   CCM  required this visit?  No     Plan:     I have personally reviewed and noted the following in the patient's chart:   Medical and social history Use of alcohol, tobacco or illicit drugs  Current medications and supplements including opioid prescriptions. Patient is not currently taking opioid prescriptions. Functional ability and status Nutritional status Physical activity Advanced directives List of other physicians Hospitalizations, surgeries, and ER visits in previous 12 months Vitals Screenings to include cognitive, depression, and falls Referrals and appointments  In addition, I have reviewed and discussed with patient certain preventive protocols, quality metrics, and best practice recommendations. A written personalized care plan for preventive services as well as general preventive health recommendations were provided to patient.     Maryan Puls, LPN   [id number]   After Visit Summary: (MyChart) Due to this being a telephonic visit, the after visit summary with patients personalized plan was offered to patient via MyChart   Nurse Notes: order placed for annual mammogram.

## 2022-10-31 NOTE — Patient Instructions (Addendum)
[name] , Thank you for taking time to come for your Medicare Wellness Visit. I appreciate your ongoing commitment to your health goals. Please review the following plan we discussed and let me know if I can assist you in the future.   These are the goals we discussed:  Goals      Patient Stated     Eat healthy and walk more.        This is a list of the screening recommended for you and due dates:  Health Maintenance  Topic Date Due   Complete foot exam   Never done   Eye exam for diabetics  Never done   Zoster (Shingles) Vaccine (1 of 2) Never done   Mammogram  01/20/2011   COVID-19 Vaccine (3 - Moderna risk series) 03/07/2020   Flu Shot  12/05/2022   Yearly kidney health urinalysis for diabetes  01/11/2023   Hemoglobin A1C  02/13/2023   Yearly kidney function blood test for diabetes  05/04/2023   Medicare Annual Wellness Visit  10/31/2023   Pap Smear  05/03/2025   DTaP/Tdap/Td vaccine (3 - Tdap) 01/05/2029   Colon Cancer Screening  06/13/2031   Hepatitis C Screening  Completed   HIV Screening  Completed   HPV Vaccine  Aged Out    Advanced directives: Please bring a copy of your health care power of attorney and living will to the office to be added to your chart at your convenience.   Conditions/risks identified: Aim for 30 minutes of exercise or brisk walking, 6-8 glasses of water, and 5 servings of fruits and vegetables each day.   Next appointment: Follow up in one year for your annual wellness visit. 11/04/2023 @ 2pm telephone call.  Preventive Care 40-64 Years, Female Preventive care refers to lifestyle choices and visits with your health care provider that can promote health and wellness. What does preventive care include? A yearly physical exam. This is also called an annual well check. Dental exams once or twice a year. Routine eye exams. Ask your health care provider how often you should have your eyes checked. Personal lifestyle choices, including: Daily  care of your teeth and gums. Regular physical activity. Eating a healthy diet. Avoiding tobacco and drug use. Limiting alcohol use. Practicing safe sex. Taking low-dose aspirin daily starting at age 45. Taking vitamin and mineral supplements as recommended by your health care provider. What happens during an annual well check? The services and screenings done by your health care provider during your annual well check will depend on your age, overall health, lifestyle risk factors, and family history of disease. Counseling  Your health care provider may ask you questions about your: Alcohol use. Tobacco use. Drug use. Emotional well-being. Home and relationship well-being. Sexual activity. Eating habits. Work and work Astronomer. Method of birth control. Menstrual cycle. Pregnancy history. Screening  You may have the following tests or measurements: Height, weight, and BMI. Blood pressure. Lipid and cholesterol levels. These may be checked every 5 years, or more frequently if you are over 52 years old. Skin check. Lung cancer screening. You may have this screening every year starting at age 62 if you have a 30-pack-year history of smoking and currently smoke or have quit within the past 15 years. Fecal occult blood test (FOBT) of the stool. You may have this test every year starting at age 32. Flexible sigmoidoscopy or colonoscopy. You may have a sigmoidoscopy every 5 years or a colonoscopy every 10 years starting at  age 72. Hepatitis C blood test. Hepatitis B blood test. Sexually transmitted disease (STD) testing. Diabetes screening. This is done by checking your blood sugar (glucose) after you have not eaten for a while (fasting). You may have this done every 1-3 years. Mammogram. This may be done every 1-2 years. Talk to your health care provider about when you should start having regular mammograms. This may depend on whether you have a family history of breast  cancer. BRCA-related cancer screening. This may be done if you have a family history of breast, ovarian, tubal, or peritoneal cancers. Pelvic exam and Pap test. This may be done every 3 years starting at age 59. Starting at age 64, this may be done every 5 years if you have a Pap test in combination with an HPV test. Bone density scan. This is done to screen for osteoporosis. You may have this scan if you are at high risk for osteoporosis. Discuss your test results, treatment options, and if necessary, the need for more tests with your health care provider. Vaccines  Your health care provider may recommend certain vaccines, such as: Influenza vaccine. This is recommended every year. Tetanus, diphtheria, and acellular pertussis (Tdap, Td) vaccine. You may need a Td booster every 10 years. Zoster vaccine. You may need this after age 84. Pneumococcal 13-valent conjugate (PCV13) vaccine. You may need this if you have certain conditions and were not previously vaccinated. Pneumococcal polysaccharide (PPSV23) vaccine. You may need one or two doses if you smoke cigarettes or if you have certain conditions. Talk to your health care provider about which screenings and vaccines you need and how often you need them. This information is not intended to replace advice given to you by your health care provider. Make sure you discuss any questions you have with your health care provider. Document Released: 05/19/2015 Document Revised: 01/10/2016 Document Reviewed: 02/21/2015 Elsevier Interactive Patient Education  2017 [place] Prevention in the Home Falls can cause injuries. They can happen to people of all ages. There are many things you can do to make your home safe and to help prevent falls. What can I do on the outside of my home? Regularly fix the edges of walkways and driveways and fix any cracks. Remove anything that might make you trip as you walk through a door, such as a raised step  or threshold. Trim any bushes or trees on the path to your home. Use bright outdoor lighting. Clear any walking paths of anything that might make someone trip, such as rocks or tools. Regularly check to see if handrails are loose or broken. Make sure that both sides of any steps have handrails. Any raised decks and porches should have guardrails on the edges. Have any leaves, snow, or ice cleared regularly. Use sand or salt on walking paths during winter. Clean up any spills in your garage right away. This includes oil or grease spills. What can I do in the bathroom? Use night lights. Install grab bars by the toilet and in the tub and shower. Do not use towel bars as grab bars. Use non-skid mats or decals in the tub or shower. If you need to sit down in the shower, use a plastic, non-slip stool. Keep the floor dry. Clean up any water that spills on the floor as soon as it happens. Remove soap buildup in the tub or shower regularly. Attach bath mats securely with double-sided non-slip rug tape. Do not have throw rugs and  other things on the floor that can make you trip. What can I do in the bedroom? Use night lights. Make sure that you have a light by your bed that is easy to reach. Do not use any sheets or blankets that are too big for your bed. They should not hang down onto the floor. Have a firm chair that has side arms. You can use this for support while you get dressed. Do not have throw rugs and other things on the floor that can make you trip. What can I do in the kitchen? Clean up any spills right away. Avoid walking on wet floors. Keep items that you use a lot in easy-to-reach places. If you need to reach something above you, use a strong step stool that has a grab bar. Keep electrical cords out of the way. Do not use floor polish or wax that makes floors slippery. If you must use wax, use non-skid floor wax. Do not have throw rugs and other things on the floor that can make  you trip. What can I do with my stairs? Do not leave any items on the stairs. Make sure that there are handrails on both sides of the stairs and use them. Fix handrails that are broken or loose. Make sure that handrails are as long as the stairways. Check any carpeting to make sure that it is firmly attached to the stairs. Fix any carpet that is loose or worn. Avoid having throw rugs at the top or bottom of the stairs. If you do have throw rugs, attach them to the floor with carpet tape. Make sure that you have a light switch at the top of the stairs and the bottom of the stairs. If you do not have them, ask someone to add them for you. What else can I do to help prevent falls? Wear shoes that: Do not have high heels. Have rubber bottoms. Are comfortable and fit you well. Are closed at the toe. Do not wear sandals. If you use a stepladder: Make sure that it is fully opened. Do not climb a closed stepladder. Make sure that both sides of the stepladder are locked into place. Ask someone to hold it for you, if possible. Clearly mark and make sure that you can see: Any grab bars or handrails. First and last steps. Where the edge of each step is. Use tools that help you move around (mobility aids) if they are needed. These include: Canes. Walkers. Scooters. Crutches. Turn on the lights when you go into a dark area. Replace any light bulbs as soon as they burn out. Set up your furniture so you have a clear path. Avoid moving your furniture around. If any of your floors are uneven, fix them. If there are any pets around you, be aware of where they are. Review your medicines with your doctor. Some medicines can make you feel dizzy. This can increase your chance of falling. Ask your doctor what other things that you can do to help prevent falls. This information is not intended to replace advice given to you by your health care provider. Make sure you discuss any questions you have with your  health care provider. Document Released: 02/16/2009 Document Revised: 09/28/2015 Document Reviewed: 05/27/2014 Elsevier Interactive Patient Education  2017 [employer].

## 2022-11-25 ENCOUNTER — Ambulatory Visit: Payer: Medicare Other | Admitting: Nurse Practitioner

## 2022-11-26 ENCOUNTER — Encounter: Payer: Self-pay | Admitting: Nurse Practitioner

## 2022-11-28 ENCOUNTER — Other Ambulatory Visit: Payer: Self-pay | Admitting: Cardiology

## 2022-12-06 ENCOUNTER — Inpatient Hospital Stay: Admission: RE | Admit: 2022-12-06 | Payer: Medicare Other | Source: Ambulatory Visit

## 2022-12-12 ENCOUNTER — Ambulatory Visit: Payer: Medicare Other | Admitting: Nurse Practitioner

## 2022-12-17 ENCOUNTER — Encounter: Payer: Self-pay | Admitting: Nurse Practitioner

## 2022-12-17 ENCOUNTER — Ambulatory Visit (INDEPENDENT_AMBULATORY_CARE_PROVIDER_SITE_OTHER): Payer: Medicare Other | Admitting: Nurse Practitioner

## 2022-12-17 VITALS — BP 124/70 | HR 62 | Temp 97.8°F | Ht <= 58 in | Wt 142.0 lb

## 2022-12-17 DIAGNOSIS — G43009 Migraine without aura, not intractable, without status migrainosus: Secondary | ICD-10-CM | POA: Diagnosis not present

## 2022-12-17 DIAGNOSIS — E119 Type 2 diabetes mellitus without complications: Secondary | ICD-10-CM | POA: Diagnosis not present

## 2022-12-17 DIAGNOSIS — D509 Iron deficiency anemia, unspecified: Secondary | ICD-10-CM | POA: Diagnosis not present

## 2022-12-17 DIAGNOSIS — K219 Gastro-esophageal reflux disease without esophagitis: Secondary | ICD-10-CM | POA: Diagnosis not present

## 2022-12-17 DIAGNOSIS — Z7984 Long term (current) use of oral hypoglycemic drugs: Secondary | ICD-10-CM

## 2022-12-17 DIAGNOSIS — E785 Hyperlipidemia, unspecified: Secondary | ICD-10-CM | POA: Diagnosis not present

## 2022-12-17 DIAGNOSIS — I10 Essential (primary) hypertension: Secondary | ICD-10-CM

## 2022-12-17 LAB — IBC + FERRITIN
Ferritin: 244.9 ng/mL (ref 10.0–291.0)
Iron: 80 ug/dL (ref 42–145)
Saturation Ratios: 26 % (ref 20.0–50.0)
TIBC: 308 ug/dL (ref 250.0–450.0)
Transferrin: 220 mg/dL (ref 212.0–360.0)

## 2022-12-17 LAB — COMPREHENSIVE METABOLIC PANEL
ALT: 12 U/L (ref 0–35)
AST: 19 U/L (ref 0–37)
Albumin: 4.5 g/dL (ref 3.5–5.2)
Alkaline Phosphatase: 71 U/L (ref 39–117)
BUN: 10 mg/dL (ref 6–23)
CO2: 32 mEq/L (ref 19–32)
Calcium: 9.7 mg/dL (ref 8.4–10.5)
Chloride: 103 mEq/L (ref 96–112)
Creatinine, Ser: 0.54 mg/dL (ref 0.40–1.20)
GFR: 99.03 mL/min (ref >60.00)
Glucose, Bld: 126 mg/dL — ABNORMAL HIGH (ref 70–99)
Potassium: 3.5 mEq/L (ref 3.5–5.1)
Sodium: 141 mEq/L (ref 135–145)
Total Bilirubin: 0.3 mg/dL (ref 0.2–1.2)
Total Protein: 7 g/dL (ref 6.0–8.3)

## 2022-12-17 LAB — LIPID PANEL
Cholesterol: 156 mg/dL (ref 0–200)
HDL: 56.8 mg/dL (ref >39.00)
LDL Cholesterol: 83 mg/dL (ref 0–99)
NonHDL: 98.73
Total CHOL/HDL Ratio: 3
Triglycerides: 80 mg/dL (ref 0.0–149.0)
VLDL: 16 mg/dL (ref 0.0–40.0)

## 2022-12-17 LAB — CBC
HCT: 41.2 % (ref 36.0–46.0)
Hemoglobin: 12.7 g/dL (ref 12.0–15.0)
MCHC: 30.7 g/dL (ref 30.0–36.0)
MCV: 73 fl — ABNORMAL LOW (ref 78.0–100.0)
Platelets: 259 10*3/uL (ref 150.0–400.0)
RBC: 5.65 Mil/uL — ABNORMAL HIGH (ref 3.87–5.11)
RDW: 17.6 % — ABNORMAL HIGH (ref 11.5–15.5)
WBC: 6.5 10*3/uL (ref 4.0–10.5)

## 2022-12-17 LAB — MICROALBUMIN / CREATININE URINE RATIO
Creatinine,U: 50.8 mg/dL
Microalb Creat Ratio: 2.9 mg/g (ref 0.0–30.0)
Microalb, Ur: 1.5 mg/dL (ref 0.0–1.9)

## 2022-12-17 LAB — POCT GLYCOSYLATED HEMOGLOBIN (HGB A1C): Hemoglobin A1C: 5.9 % — AB (ref 4.0–5.6)

## 2022-12-17 NOTE — Assessment & Plan Note (Signed)
Patient currently maintained on Jardiance 10 mg daily.  A1c under great control continue medication as prescribed.  Update urine microalbumin today along with detailed foot exam

## 2022-12-17 NOTE — Progress Notes (Signed)
Established Patient Office Visit  Subjective   Patient ID: Stephanie Bray, female    DOB: 06-26-1960  Age: 62 y.o. MRN: [id number]  Chief Complaint  Patient presents with   Diabetes   Annual Exam      DM2: Patient currently maintained on Jardiance 10 mg.  Patient does not check her glucose at home patient's last A1c was 6.2 States that she has been doing better on diet with salads and lean meats. States sugar free items   HTN: Patient currently maintained on amlodipine  Migraine: Patient currently maintained on sumatriptan  as needed.  Patient states the medication is beneficial  GERD: Patient currently maintained on omeprazole 40 mg daily.  States with the dose increase she has had a control of her GERD symptoms  Depression: on [facility] at [employer]. Sertaline and trazoadone.  Sees them monthly  for complete physical and follow up of chronic conditions.  Immunizations: -Tetanus: Completed in 2020 -Influenza: Completed this season -Shingles: Get at local pharmacy -Pneumonia: Has had pneumococcal -Covid: original two   Diet: Fair diet. Breakfast and lunch. States that she will do diet green tea and flavored water  Exercise: No regular exercise.Depends on the weather she will walk to the mailbox and box  Eye exam: Completes annually. Needs updating  Dental exam: Dentures, no regular follow-up with dentist  Colonoscopy: Completed in 06/12/2021, recall 10 years Lung Cancer Screening: N/A  Pap smear: Done December 2023 came back negative with negative HPV  Mammogram: Order has been placed.  Patient is going to come to the mobile mammogram  Dexa: Too young  Sleep: going to bed aroun7 and get up aroun 630. Feels rested. Been told she does snore       Review of Systems  Constitutional:  Negative for chills and fever.  HENT:  Negative for ear discharge, ear pain and sore throat (scratchy).   Respiratory:  Positive for cough. Negative for shortness of breath.    Cardiovascular:  Negative for chest pain and leg swelling.  Gastrointestinal:  Negative for abdominal pain, blood in stool, constipation, diarrhea, nausea and vomiting.       BM daily   Genitourinary:  Negative for dysuria and hematuria.  Neurological:  Negative for tingling and headaches.  Psychiatric/Behavioral:  Negative for hallucinations and suicidal ideas.       Objective:     BP 124/70   Pulse 62   Temp 97.8 F (36.6 C) (Temporal)   Ht 4\' 9"  (1.448 m)   Wt 142 lb (64.4 kg)   SpO2 95%   BMI 30.73 kg/m  BP Readings from Last 3 Encounters:  12/17/22 124/70  10/23/22 130/62  09/17/22 (!) 162/70   Wt Readings from Last 3 Encounters:  12/17/22 142 lb (64.4 kg)  10/31/22 142 lb (64.4 kg)  10/23/22 144 lb (65.3 kg)      Physical Exam Vitals and nursing note reviewed.  Constitutional:      Appearance: Normal appearance.  HENT:     Right Ear: Tympanic membrane, ear canal and external ear normal.     Left Ear: Tympanic membrane, ear canal and external ear normal.     Mouth/Throat:     Mouth: Mucous membranes are moist.     Pharynx: Oropharynx is clear.  Eyes:     Extraocular Movements: Extraocular movements intact.     Pupils: Pupils are equal, round, and reactive to light.  Cardiovascular:     Rate and Rhythm: Normal rate and regular rhythm.  Pulses: Normal pulses.     Heart sounds: Normal heart sounds.  Pulmonary:     Effort: Pulmonary effort is normal.     Breath sounds: Normal breath sounds.  Abdominal:     General: Bowel sounds are normal. There is no distension.     Palpations: There is no mass.     Tenderness: There is no abdominal tenderness.     Hernia: No hernia is present.  Musculoskeletal:     Right lower leg: No edema.     Left lower leg: No edema.  Lymphadenopathy:     Cervical: No cervical adenopathy.  Skin:    General: Skin is warm.  Neurological:     General: No focal deficit present.     Mental Status: She is alert.     Deep  Tendon Reflexes:     Reflex Scores:      Bicep reflexes are 2+ on the right side and 2+ on the left side.      Patellar reflexes are 2+ on the right side and 2+ on the left side.    Comments: Bilateral upper and lower extremity strength 5/5  Psychiatric:        Mood and Affect: Mood normal.        Behavior: Behavior normal.        Thought Content: Thought content normal.        Judgment: Judgment normal.     Diabetic Foot Form - Detailed   Diabetic Foot Exam - detailed Diabetic Foot exam was performed with the following findings: Yes 12/17/2022 11:05 AM  Is there swelling or and abnormal foot shape?: No Is there a claw toe deformity?: No Is there elevated skin temparature?: No Pulse Foot Exam completed.: Yes   Right posterior Tibialias: Present Left posterior Tibialias: Present   Right Dorsalis Pedis: Present Left Dorsalis Pedis: Present  Sensory Foot Exam Completed.: Yes Semmes-Weinstein Monofilament Test   Comments: All 10 sites tested bilaterally sensation intact     No results found for any visits on 12/17/22.    The ASCVD Risk score (Arnett DK, et al., 2019) failed to calculate for the following reasons:   The patient has a prior MI or stroke diagnosis    Assessment & Plan:   Problem List Items Addressed This Visit       Cardiovascular and Mediastinum   Essential hypertension - Primary    Patient currently maintained on amlodipine 10 mg daily.  Blood pressure under good control.  Continue medication as prescribed      Relevant Orders   CBC   Comprehensive metabolic panel   Migraine without aura and without status migrainosus, not intractable    History of same.  Patient will use sumatriptan 25 mg as needed.  Seems to work well continue medication as prescribed        Digestive   GERD (gastroesophageal reflux disease)    Patient currently on omeprazole 40 mg.  She was on 20 mg but since dose increase she has had increased control of her symptoms.  Continue  medication as prescribed        Endocrine   Diabetes mellitus without complication (HCC)    Patient currently maintained on Jardiance 10 mg daily.  A1c under great control continue medication as prescribed.  Update urine microalbumin today along with detailed foot exam      Relevant Orders   POCT glycosylated hemoglobin (Hb A1C)   CBC   Comprehensive metabolic panel   Lipid panel  Microalbumin / creatinine urine ratio     Other   HLD (hyperlipidemia)    History of same patient currently maintained on rosuvastatin 20 mg pending lipid panel today      Relevant Orders   Lipid panel   Iron deficiency anemia    History of the same pending iron panel today      Relevant Orders   IBC + Ferritin    Return in about 6 months (around 06/19/2023) for DM recheck.    Audria Nine, NP

## 2022-12-17 NOTE — Patient Instructions (Signed)
Nice to see you today I will be in touch with the labs once I have reviewed them Follow up with me in 6 months, sooner if you need me  The mammogram bus is here on 01/27/2023  Get the shingles vaccine at your local pharmacy

## 2022-12-17 NOTE — Assessment & Plan Note (Signed)
Patient currently on omeprazole 40 mg.  She was on 20 mg but since dose increase she has had increased control of her symptoms.  Continue medication as prescribed

## 2022-12-17 NOTE — Assessment & Plan Note (Signed)
Patient currently maintained on amlodipine 10 mg daily.  Blood pressure under good control.  Continue medication as prescribed

## 2022-12-17 NOTE — Assessment & Plan Note (Signed)
History of the same pending iron panel today

## 2022-12-17 NOTE — Assessment & Plan Note (Signed)
History of same patient currently maintained on rosuvastatin 20 mg pending lipid panel today

## 2022-12-17 NOTE — Assessment & Plan Note (Signed)
History of same.  Patient will use sumatriptan 25 mg as needed.  Seems to work well continue medication as prescribed

## 2022-12-18 ENCOUNTER — Ambulatory Visit (INDEPENDENT_AMBULATORY_CARE_PROVIDER_SITE_OTHER): Payer: Medicare Other | Admitting: Podiatry

## 2022-12-18 ENCOUNTER — Encounter: Payer: Self-pay | Admitting: Podiatry

## 2022-12-18 DIAGNOSIS — B351 Tinea unguium: Secondary | ICD-10-CM

## 2022-12-18 DIAGNOSIS — D2372 Other benign neoplasm of skin of left lower limb, including hip: Secondary | ICD-10-CM

## 2022-12-18 DIAGNOSIS — E119 Type 2 diabetes mellitus without complications: Secondary | ICD-10-CM | POA: Diagnosis not present

## 2022-12-18 DIAGNOSIS — M79676 Pain in unspecified toe(s): Secondary | ICD-10-CM | POA: Diagnosis not present

## 2022-12-18 DIAGNOSIS — D2371 Other benign neoplasm of skin of right lower limb, including hip: Secondary | ICD-10-CM | POA: Diagnosis not present

## 2022-12-18 NOTE — Progress Notes (Signed)
She presents today chief complaint of painful elongated toenails she is also starting to develop some burning in her feet.  Just had her physical performed by her primary care provider who treats her diabetes stating that she is doing really well with that and that her A1c was within range and that she should continue her Jardiance.  She has lost approximately 30 pounds.  Objective: Vital signs are stable she is alert and oriented x 3.  Pulses are palpable.  Neurologic sensorium is intact toenails are long thick yellow dystrophic clinically mycotic tender on palpation as well as debridement.  Multiple benign skin lesions medial and plantar medial aspect of the first metatarsal phalangeal joint area bilaterally.  No open lesions or wounds are identified.  Assessment: Early diabetic peripheral neuropathy most likely with pain in limb secondary to onychomycosis.  Plan: Debridement of toenails 1 through 5 bilateral encouraged her to continue to wash her feet regularly apply lotions and to inspect her feet daily.  Follow-up with her in 3 months

## 2022-12-19 ENCOUNTER — Telehealth: Payer: Self-pay | Admitting: Nurse Practitioner

## 2022-12-19 NOTE — Telephone Encounter (Signed)
Pt called in stated she still not feeling good would like the antibiotic called in [facility] as discuss on resent visit [phone]

## 2022-12-20 ENCOUNTER — Telehealth: Payer: Self-pay | Admitting: Nurse Practitioner

## 2022-12-20 DIAGNOSIS — J029 Acute pharyngitis, unspecified: Secondary | ICD-10-CM

## 2022-12-20 MED ORDER — FLUTICASONE PROPIONATE 50 MCG/ACT NA SUSP
1.0000 | Freq: Every day | NASAL | 0 refills | Status: DC
Start: 2022-12-20 — End: 2023-11-18

## 2022-12-20 NOTE — Telephone Encounter (Signed)
Left detailed voicemail for patient to call the office back.

## 2022-12-20 NOTE — Telephone Encounter (Signed)
I have sent in some flonase and and make sure she is taking her allergy pill . She has gotten nosebleeds with flonase before so she can use nasal saline with it to prevent that. Tell her to reach out to me on Monday if not improving

## 2022-12-20 NOTE — Telephone Encounter (Signed)
Patent called regarding appointment from Tuesday, states she is still having sore throat and cough. Patient asked if [facility] can [facility] for [facility] symptoms

## 2023-01-23 ENCOUNTER — Other Ambulatory Visit: Payer: Self-pay | Admitting: Nurse Practitioner

## 2023-01-27 ENCOUNTER — Other Ambulatory Visit: Payer: Self-pay | Admitting: Nurse Practitioner

## 2023-01-27 ENCOUNTER — Ambulatory Visit
Admission: RE | Admit: 2023-01-27 | Discharge: 2023-01-27 | Disposition: A | Discharge status: Home / Self Care | Payer: Medicare Other | Source: Ambulatory Visit | Attending: Nurse Practitioner

## 2023-01-27 DIAGNOSIS — Z0000 Encounter for general adult medical examination without abnormal findings: Secondary | ICD-10-CM

## 2023-01-27 DIAGNOSIS — Z1231 Encounter for screening mammogram for malignant neoplasm of breast: Secondary | ICD-10-CM

## 2023-01-31 ENCOUNTER — Telehealth: Payer: Self-pay | Admitting: Nurse Practitioner

## 2023-01-31 NOTE — Telephone Encounter (Signed)
Patient called in returning call she received. Relayed message regarding mammo.

## 2023-02-03 ENCOUNTER — Other Ambulatory Visit: Payer: Self-pay | Admitting: Nurse Practitioner

## 2023-02-03 DIAGNOSIS — K219 Gastro-esophageal reflux disease without esophagitis: Secondary | ICD-10-CM

## 2023-03-10 ENCOUNTER — Ambulatory Visit: Payer: Medicare Other | Admitting: Podiatry

## 2023-03-14 ENCOUNTER — Other Ambulatory Visit: Payer: Self-pay | Admitting: Cardiology

## 2023-04-10 ENCOUNTER — Telehealth: Payer: Self-pay | Admitting: Nurse Practitioner

## 2023-04-10 MED ORDER — SERTRALINE HCL 100 MG PO TABS: 100.0000 mg | ORAL_TABLET | Freq: Every day | ORAL | 1 refills | Status: DC

## 2023-04-10 NOTE — Telephone Encounter (Signed)
Prescription Request  04/10/2023  LOV: 12/17/2022  What is the name of the medication or equipment? sertraline (ZOLOFT) 100 MG tablet   Have you contacted your pharmacy to request a refill? Yes   Which pharmacy would you like this sent to?  [facility] - [place], [place] - [address] [address] [place] [address] Phone: [phone] Fax: [phone]    Patient notified that their request is being sent to the clinical staff for review and that they should receive a response within 2 business days.   Please advise at Mobile [phone] (mobile)

## 2023-05-04 ENCOUNTER — Other Ambulatory Visit: Payer: Self-pay | Admitting: Nurse Practitioner

## 2023-05-13 ENCOUNTER — Other Ambulatory Visit: Payer: Self-pay | Admitting: Nurse Practitioner

## 2023-05-13 DIAGNOSIS — K219 Gastro-esophageal reflux disease without esophagitis: Secondary | ICD-10-CM

## 2023-06-19 ENCOUNTER — Ambulatory Visit: Payer: Medicare Other | Admitting: Nurse Practitioner

## 2023-06-19 ENCOUNTER — Encounter: Payer: Self-pay | Admitting: Podiatry

## 2023-06-19 ENCOUNTER — Ambulatory Visit (INDEPENDENT_AMBULATORY_CARE_PROVIDER_SITE_OTHER): Payer: Medicare Other | Admitting: Podiatry

## 2023-06-19 VITALS — BP 120/80 | HR 70 | Temp 98.2°F | Ht <= 58 in | Wt 144.2 lb

## 2023-06-19 DIAGNOSIS — Z23 Encounter for immunization: Secondary | ICD-10-CM | POA: Diagnosis not present

## 2023-06-19 DIAGNOSIS — Z7984 Long term (current) use of oral hypoglycemic drugs: Secondary | ICD-10-CM

## 2023-06-19 DIAGNOSIS — M79676 Pain in unspecified toe(s): Secondary | ICD-10-CM

## 2023-06-19 DIAGNOSIS — I10 Essential (primary) hypertension: Secondary | ICD-10-CM

## 2023-06-19 DIAGNOSIS — E119 Type 2 diabetes mellitus without complications: Secondary | ICD-10-CM | POA: Diagnosis not present

## 2023-06-19 DIAGNOSIS — B351 Tinea unguium: Secondary | ICD-10-CM | POA: Diagnosis not present

## 2023-06-19 DIAGNOSIS — R200 Anesthesia of skin: Secondary | ICD-10-CM

## 2023-06-19 DIAGNOSIS — G3184 Mild cognitive impairment, so stated: Secondary | ICD-10-CM | POA: Diagnosis not present

## 2023-06-19 DIAGNOSIS — J029 Acute pharyngitis, unspecified: Secondary | ICD-10-CM

## 2023-06-19 LAB — POCT GLYCOSYLATED HEMOGLOBIN (HGB A1C): Hemoglobin A1C: 6.1 % — AB (ref 4.0–5.6)

## 2023-06-19 LAB — COMPREHENSIVE METABOLIC PANEL
ALT: 14 U/L (ref 0–35)
AST: 20 U/L (ref 0–37)
Albumin: 4.7 g/dL (ref 3.5–5.2)
Alkaline Phosphatase: 78 U/L (ref 39–117)
BUN: 14 mg/dL (ref 6–23)
CO2: 29 meq/L (ref 19–32)
Calcium: 9.5 mg/dL (ref 8.4–10.5)
Chloride: 106 meq/L (ref 96–112)
Creatinine, Ser: 0.51 mg/dL (ref 0.40–1.20)
GFR: 100.05 mL/min (ref >60.00)
Glucose, Bld: 113 mg/dL — ABNORMAL HIGH (ref 70–99)
Potassium: 3.7 meq/L (ref 3.5–5.1)
Sodium: 143 meq/L (ref 135–145)
Total Bilirubin: 0.4 mg/dL (ref 0.2–1.2)
Total Protein: 7.2 g/dL (ref 6.0–8.3)

## 2023-06-19 LAB — CBC
HCT: 40 % (ref 36.0–46.0)
Hemoglobin: 12.4 g/dL (ref 12.0–15.0)
MCHC: 30.9 g/dL (ref 30.0–36.0)
MCV: 74.5 fL — ABNORMAL LOW (ref 78.0–100.0)
Platelets: 228 10*3/uL (ref 150.0–400.0)
RBC: 5.37 Mil/uL — ABNORMAL HIGH (ref 3.87–5.11)
RDW: 17.1 % — ABNORMAL HIGH (ref 11.5–15.5)
WBC: 7.6 10*3/uL (ref 4.0–10.5)

## 2023-06-19 LAB — TSH: TSH: 2.2 u[IU]/mL (ref 0.35–5.50)

## 2023-06-19 LAB — VITAMIN B12: Vitamin B-12: 140 pg/mL — ABNORMAL LOW (ref 211–911)

## 2023-06-19 MED ORDER — FERROUS SULFATE 325 (65 FE) MG PO TABS: 325.0000 mg | ORAL_TABLET | ORAL | 0 refills | Status: DC

## 2023-06-19 MED ORDER — DONEPEZIL HCL 5 MG PO TABS: 5.0000 mg | ORAL_TABLET | Freq: Every day | ORAL | 0 refills | Status: DC

## 2023-06-19 MED ORDER — FERROUS SULFATE 325 (65 FE) MG PO TABS: 325.0000 mg | ORAL_TABLET | ORAL | 0 refills | Status: AC

## 2023-06-19 NOTE — Assessment & Plan Note (Signed)
Patient already on antihistamine.  Patient will try nasal saline along with Flonase nasal spray

## 2023-06-19 NOTE — Progress Notes (Signed)
She presents today chief complaint of painful elongated toenails she is also starting to develop some burning in her feet.  Just had her physical performed by her primary care provider who treats her diabetes stating that she is doing really well with that and that her A1c was within range and that she should continue her Jardiance.  She has lost approximately 30 pounds.  Objective: Vital signs are stable she is alert and oriented x 3.  Pulses are palpable.  Neurologic sensorium is intact toenails are long thick yellow dystrophic clinically mycotic tender on palpation as well as debridement.  Multiple benign skin lesions medial and plantar medial aspect of the first metatarsal phalangeal joint area bilaterally.  No open lesions or wounds are identified.  Assessment: Early diabetic peripheral neuropathy most likely with pain in limb secondary to onychomycosis.  Plan: Debridement of toenails 1 through 5 bilateral encouraged her to continue to wash her feet regularly apply lotions and to inspect her feet daily.  Follow-up with her in 3 months

## 2023-06-19 NOTE — Assessment & Plan Note (Signed)
Intermittent in nature.  Neurovascularly intact no injury observe for now

## 2023-06-19 NOTE — Assessment & Plan Note (Signed)
Patient currently maintained on amlodipine 10 mg daily.  Blood pressure under good control.  Patient not having any adverse drug events continue amlodipine 10 mg daily

## 2023-06-19 NOTE — Progress Notes (Signed)
Established Patient Office Visit  Subjective   Patient ID: Stephanie Bray, female    DOB: 1960/12/27  Age: 63 y.o. MRN: [id number]  Chief Complaint  Patient presents with   Diabetes   Flu Vaccine    Pt would like flu shot   Medication Refill    Amlodipine, rosuvastatin, and iron medication.       DM2: she is currently maintained on jardiance 10 mg daily. She does not check her sugar levels at home.  Beet root supplement. States that she will have 2 meals a day and sugar free snacks.  She will dirnk diet green tea, cofee, and some water  She will walk when the weather is good  HTN:  she is on amlodipine 10 mg daily.  Tolerates medication well.  Does not check blood pressure at home  Memory: states that she will have a converstatoin with her sons. States that she is concerned about her memory or dementia.  States that she will forget what she was talking to them about.  Sore throat: states that it is described as tight and has been for several weeks. She has tried a sore throat lozenge that has helped. States worse in the evening. She does ahave a cough that is non productive   Left leg numbness: states that it is a coming and going that is on the left side. States that it last for 10 mins. States that if she sits for too long it will happen.  No history of injury or back pain  Movement: states that she feels something movement in her RUQ.  She describes as a snake and it happens around the same time every night.    Review of Systems  Constitutional:  Negative for chills and fever.  HENT:  Positive for sore throat. Negative for ear discharge, ear pain and sinus pain.   Respiratory:  Negative for shortness of breath.   Cardiovascular:  Negative for chest pain.  Gastrointestinal:  Negative for abdominal pain and constipation.       BM daily   Neurological:  Positive for tingling. Negative for dizziness, weakness and headaches.  Psychiatric/Behavioral:  Negative for  hallucinations and suicidal ideas.       Objective:     BP 120/80   Pulse 70   Temp 98.2 F (36.8 C) (Oral)   Ht 4\' 9"  (1.448 m)   Wt 144 lb 3.2 oz (65.4 kg)   SpO2 94%   BMI 31.20 kg/m  BP Readings from Last 3 Encounters:  06/19/23 120/80  12/17/22 124/70  10/23/22 130/62   Wt Readings from Last 3 Encounters:  06/19/23 144 lb 3.2 oz (65.4 kg)  12/17/22 142 lb (64.4 kg)  10/31/22 142 lb (64.4 kg)   SpO2 Readings from Last 3 Encounters:  06/19/23 94%  12/17/22 95%  10/23/22 98%      Physical Exam Vitals and nursing note reviewed.  Constitutional:      Appearance: Normal appearance.  HENT:     Mouth/Throat:     Mouth: Mucous membranes are moist.  Cardiovascular:     Rate and Rhythm: Normal rate and regular rhythm.     Pulses:          Posterior tibial pulses are 2+ on the right side and 2+ on the left side.     Heart sounds: Normal heart sounds.  Pulmonary:     Effort: Pulmonary effort is normal.     Breath sounds: Normal breath sounds.  Abdominal:  General: Bowel sounds are normal. There is no distension.     Palpations: There is no mass.     Tenderness: There is no abdominal tenderness.     Hernia: A hernia is present.  Musculoskeletal:     Lumbar back: No tenderness or bony tenderness. Negative right straight leg raise test and negative left straight leg raise test.  Lymphadenopathy:     Cervical: No cervical adenopathy.  Neurological:     General: No focal deficit present.     Mental Status: She is alert.     Deep Tendon Reflexes:     Reflex Scores:      Bicep reflexes are 2+ on the right side and 2+ on the left side.      Patellar reflexes are 2+ on the right side and 2+ on the left side.    Comments: Bilateral upper and lower extremity strength 5/5      Results for orders placed or performed in visit on 06/19/23  POCT glycosylated hemoglobin (Hb A1C)  Result Value Ref Range   Hemoglobin A1C 6.1 (A) 4.0 - 5.6 %   HbA1c POC (<> result,  manual entry)     HbA1c, POC (prediabetic range)     HbA1c, POC (controlled diabetic range)        The ASCVD Risk score (Arnett DK, et al., 2019) failed to calculate for the following reasons:   Risk score cannot be calculated because patient has a medical history suggesting prior/existing ASCVD    Assessment & Plan:   Problem List Items Addressed This Visit       Cardiovascular and Mediastinum   Essential hypertension   Patient currently maintained on amlodipine 10 mg daily.  Blood pressure under good control.  Patient not having any adverse drug events continue amlodipine 10 mg daily        Endocrine   Diabetes mellitus without complication (HCC) - Primary   Patient maintained on Jardiance 10 mg daily.  Does not check sugars at home.  A1c well-controlled at 6.1%.  Continue Jardiance 10 mg daily      Relevant Orders   POCT glycosylated hemoglobin (Hb A1C) (Completed)   CBC   Comprehensive metabolic panel     Other   MCI (mild cognitive impairment)   MMSE ministered office.  Patient has MCI.  Start donepezil 5 mg nightly      Relevant Medications   donepezil (ARICEPT) 5 MG tablet   Other Relevant Orders   CBC   Comprehensive metabolic panel   Vitamin B12   Left leg numbness   Intermittent in nature.  Neurovascularly intact no injury observe for now      Relevant Orders   TSH   Sore throat   Patient already on antihistamine.  Patient will try nasal saline along with Flonase nasal spray      Other Visit Diagnoses       Need for influenza vaccination       Relevant Orders   Flu vaccine trivalent PF, 6mos and older(Flulaval,Afluria,Fluarix,Fluzone) (Completed)       Return in about 3 months (around 09/16/2023) for memory recheck .    Audria Nine, NP

## 2023-06-19 NOTE — Patient Instructions (Addendum)
Nice to see you today Use the flonase with ocean spray nasal saline  Dr Mariah Milling Cadence Fransico Michael   Phone: [phone]  Follow up with me in 3 months, sooner if you need me

## 2023-06-19 NOTE — Assessment & Plan Note (Signed)
Patient maintained on Jardiance 10 mg daily.  Does not check sugars at home.  A1c well-controlled at 6.1%.  Continue Jardiance 10 mg daily

## 2023-06-19 NOTE — Assessment & Plan Note (Signed)
[facility] ministered office.  Patient has MCI.  Start donepezil 5 mg nightly

## 2023-06-25 ENCOUNTER — Other Ambulatory Visit: Payer: Self-pay | Admitting: Nurse Practitioner

## 2023-06-25 DIAGNOSIS — E538 Deficiency of other specified B group vitamins: Secondary | ICD-10-CM

## 2023-07-11 ENCOUNTER — Other Ambulatory Visit: Payer: Self-pay | Admitting: Nurse Practitioner

## 2023-07-11 NOTE — Telephone Encounter (Signed)
 Copied from CRM [id number]. Topic: Clinical - Medication Refill >> Jul 11, 2023  9:29 AM Benetta Spar A wrote: Most Recent Primary Care Visit:  Provider: Eden Emms  Department: [facility]  Visit Type: OFFICE VISIT  Date: 06/19/2023  Medication: JARDIANCE 10 MG TABS tablet  Has the patient contacted their pharmacy? No (Agent: If no, request that the patient contact the pharmacy for the refill. If patient does not wish to contact the pharmacy document the reason why and proceed with request.) (Agent: If yes, when and what did the pharmacy advise?)  Is this the correct pharmacy for this prescription? Yes[facility] If no, delete pharmacy and type the correct one.  This is the patient's preferred pharmacy:  [facility] - [place], [place] - [address] [place] [address] Phone: [phone] Fax: [phone]  [phone] [place], [place] - [address] [place] [address] Phone: [phone] Fax: [phone]   Has the prescription been filled recently? No  Is the patient out of the medication? Yes  Has the patient been seen for an appointment in the last year OR does the patient have an upcoming appointment? Yes  Can we respond through MyChart? No  Agent: Please be advised that Rx refills may take up to 3 business days. We ask that you follow-up with your pharmacy.

## 2023-07-12 MED ORDER — EMPAGLIFLOZIN 10 MG PO TABS: 10.0000 mg | ORAL_TABLET | Freq: Every day | ORAL | 1 refills | Status: DC

## 2023-07-13 ENCOUNTER — Other Ambulatory Visit: Payer: Self-pay | Admitting: Nurse Practitioner

## 2023-07-22 ENCOUNTER — Other Ambulatory Visit (INDEPENDENT_AMBULATORY_CARE_PROVIDER_SITE_OTHER): Payer: Medicare Other

## 2023-07-22 DIAGNOSIS — E538 Deficiency of other specified B group vitamins: Secondary | ICD-10-CM | POA: Diagnosis not present

## 2023-07-22 LAB — VITAMIN B12: Vitamin B-12: 218 pg/mL (ref 211–911)

## 2023-07-24 ENCOUNTER — Telehealth: Payer: Self-pay | Admitting: Nurse Practitioner

## 2023-07-24 NOTE — Telephone Encounter (Signed)
 Relayed message from provider regarding results. Patient states she understood and had no questions.

## 2023-07-29 ENCOUNTER — Encounter: Payer: Self-pay | Admitting: Nurse Practitioner

## 2023-08-21 ENCOUNTER — Other Ambulatory Visit: Payer: Self-pay | Admitting: Cardiovascular Disease

## 2023-08-25 ENCOUNTER — Other Ambulatory Visit: Payer: Self-pay | Admitting: Nurse Practitioner

## 2023-08-25 NOTE — Telephone Encounter (Signed)
 Copied from CRM [id number]. Topic: Clinical - Medication Refill >> Aug 25, 2023  9:40 AM DeAngela L wrote: Most Recent Primary Care Visit:  Provider: [facility]  Department: [facility]  Visit Type: LAB VISIT  Date: 07/22/2023  Medication: cetirizine  (ZYRTEC ) 10 MG tablet   Has the patient contacted their pharmacy? Yes  (Agent: If no, request that the patient contact the pharmacy for the refill. If patient does not wish to contact the pharmacy document the reason why and proceed with request.) (Agent: If yes, when and what did the pharmacy advise?)  Is this the correct pharmacy for this prescription? Yes  If no, delete pharmacy and type the correct one.  This is the patient's preferred pharmacy:  [facility] - [facility] [place], [place] - [address] [place] [address] Phone: [phone] Fax: [phone]   Has the prescription been filled recently? No   Is the patient out of the medication? Yes   Has the patient been seen for an appointment in the last year OR does the patient have an upcoming appointment? Yes   Can we respond through MyChart? No   Agent: Please be advised that Rx refills may take up to 3 business days. We ask that you follow-up with your pharmacy.

## 2023-08-26 MED ORDER — CETIRIZINE HCL 10 MG PO TABS: 10.0000 mg | ORAL_TABLET | Freq: Every day | ORAL | 3 refills | Status: AC

## 2023-09-16 ENCOUNTER — Ambulatory Visit: Payer: Medicare Other | Admitting: Nurse Practitioner

## 2023-09-18 ENCOUNTER — Ambulatory Visit: Attending: Medical | Admitting: Medical

## 2023-09-18 ENCOUNTER — Encounter: Payer: Self-pay | Admitting: Medical

## 2023-09-18 ENCOUNTER — Ambulatory Visit

## 2023-09-18 VITALS — BP 126/60 | HR 86 | Ht <= 58 in | Wt 135.4 lb

## 2023-09-18 DIAGNOSIS — Z8679 Personal history of other diseases of the circulatory system: Secondary | ICD-10-CM | POA: Diagnosis present

## 2023-09-18 DIAGNOSIS — Z79899 Other long term (current) drug therapy: Secondary | ICD-10-CM | POA: Diagnosis present

## 2023-09-18 DIAGNOSIS — I493 Ventricular premature depolarization: Secondary | ICD-10-CM | POA: Insufficient documentation

## 2023-09-18 DIAGNOSIS — Q210 Ventricular septal defect: Secondary | ICD-10-CM | POA: Insufficient documentation

## 2023-09-18 DIAGNOSIS — E782 Mixed hyperlipidemia: Secondary | ICD-10-CM | POA: Insufficient documentation

## 2023-09-18 DIAGNOSIS — I10 Essential (primary) hypertension: Secondary | ICD-10-CM | POA: Diagnosis present

## 2023-09-18 MED ORDER — FUROSEMIDE 20 MG PO TABS: 20.0000 mg | ORAL_TABLET | Freq: Every day | ORAL | 1 refills | Status: DC | PRN

## 2023-09-18 MED ORDER — NITROGLYCERIN 0.4 MG SL SUBL: 0.4000 mg | SUBLINGUAL_TABLET | SUBLINGUAL | 2 refills | Status: AC | PRN

## 2023-09-18 NOTE — Patient Instructions (Signed)
 Medication Instructions:  Your Physician recommend you continue on your current medication as directed.    *If you need a refill on your cardiac medications before your next appointment, please call your pharmacy*  Lab Work: Your provider would like for you to have following labs drawn today BMP MAG CBC TSH.   If you have labs (blood work) drawn today and your tests are completely normal, you will receive your results only by: MyChart Message (if you have MyChart) OR A paper copy in the mail If you have any lab test that is abnormal or we need to change your treatment, we will call you to review the results.  Testing/Procedures: Zio Monitor  ZIO XT- Long Term Monitor Instructions  Your physician has requested you wear a ZIO patch monitor for 7 days.  This is a single patch monitor. [employer] supplies one patch monitor per enrollment. Additional stickers are not available. Please do not apply patch if you will be having a Nuclear Stress Test,  Echocardiogram, Cardiac CT, MRI, or Chest Xray during the period you would be wearing the  monitor. The patch cannot be worn during these tests. You cannot remove and re-apply the  ZIO XT patch monitor.  Your ZIO patch monitor will be mailed 3 day USPS to your address on file. It may take 3-5 days  to receive your monitor after you have been enrolled.  Once you have received your monitor, please review the enclosed instructions. Your monitor  has already been registered assigning a specific monitor serial # to you.  Billing and Patient Assistance Program Information  We have supplied [employer] with any of your insurance information on file for billing purposes. [employer] offers a sliding scale Patient Assistance Program for patients that do not have  insurance, or whose insurance does not completely cover the cost of the ZIO monitor.  You must apply for the Patient Assistance Program to qualify for this discounted rate.  To apply, please call [employer]  at [phone], select option 4, select option 2, ask to apply for  Patient Assistance Program. Sanna Crystal will ask your household income, and how many people  are in your household. They will quote your out-of-pocket cost based on that information.  [employer] will also be able to set up a 64-month, interest-free payment plan if needed.  Applying the monitor   Shave hair from upper left chest.  Hold abrader disc by orange tab. Rub abrader in 40 strokes over the upper left chest as  indicated in your monitor instructions.  Clean area with 4 enclosed alcohol pads. Let dry.  Apply patch as indicated in monitor instructions. Patch will be placed under collarbone on left  side of chest with arrow pointing upward.  Rub patch adhesive wings for 2 minutes. Remove white label marked "1". Remove the white  label marked "2". Rub patch adhesive wings for 2 additional minutes.  While looking in a mirror, press and release button in center of patch. A small green light will  flash 3-4 times. This will be your only indicator that the monitor has been turned on.  Do not shower for the first 24 hours. You may shower after the first 24 hours.  Press the button if you feel a symptom. You will hear a small click. Record Date, Time and  Symptom in the Patient Logbook.  When you are ready to remove the patch, follow instructions on the last 2 pages of Patient  Logbook. Stick patch monitor onto the last page  of Patient Logbook.  Place Patient Logbook in the blue and white box. Use locking tab on box and tape box closed  securely. The blue and white box has prepaid postage on it. Please place it in the mailbox as  soon as possible. Your physician should have your test results approximately 7 days after the  monitor has been mailed back to [facility].  Call [facility] Customer Care at [phone] if you have questions regarding  your ZIO XT patch monitor. Call them immediately if you see an orange light  blinking on your  monitor.  If your monitor falls off in less than 4 days, contact our Monitor department at [phone].  If your monitor becomes loose or falls off after 4 days call [employer] at [phone] for  suggestions on securing your monitor   Follow-Up: At [facility], you and your health needs are our priority.  As part of our continuing mission to provide you with exceptional heart care, our providers are all part of one team.  This team includes your primary Cardiologist (physician) and Advanced Practice Providers or APPs (Physician Assistants and Nurse Practitioners) who all work together to provide you with the care you need, when you need it.  Your next appointment:   2 month(s)  Provider:   Timothy Gollan, MD or Cadence Furth, PA-C    We recommend signing up for the patient portal called "MyChart".  Sign up information is provided on this After Visit Summary.  MyChart is used to connect with patients for Virtual Visits (Telemedicine).  Patients are able to view lab/test results, encounter notes, upcoming appointments, etc.  Non-urgent messages can be sent to your provider as well.   To learn more about what you can do with MyChart, go to [email or web address].

## 2023-09-18 NOTE — Progress Notes (Signed)
 Cardiology Office Note:  .   Date:  09/18/2023  ID:  Stephanie Bray, DOB 25-Dec-1960, MRN [id number] PCP: Dorothe Gaster, NP  [facility] Providers Cardiologist:  Belva Boyden, MD     History of Present Illness: .   Stephanie Bray is a 63 y.o. female  with a hx of hypertension, DM2, hyperlipidemia, diabetes, TIA, stress-induced CM with improvement of EF, and VSD who is being seen for 1 year follow-up.   Patient was admitted in July 2020 with chest pain and bloody nose.  Right and left heart cath showed tortuous coronaries.  Noted to have moderate PAH and severe LV dysfunction consistent with Takotsubo's cardiomyopathy.  Her pulmonary valve gradient on pullback during right heart cath was 15 mmHg.  No significant shunt by Qp/Qs. Repeat echo 3 months later showed improvement of EF 50 to 55%.  She again had a perimembranous VSD noted.  She was noted to have moderate anterior leaflet mitral valve prolapse without significant regurgitation.  RV size and function were normal.  Pulmonic valve gradient was noted to be elevated from 11 to 21 mmHg compared to prior.   Patient was seen in May 2023 with concern for elevated blood pressure.  A 3-day heart monitor was ordered to quantify PVC burden.  This showed 6% PVC burden.  Patient was last seen 09/17/2022 and was stable from a cardiac perspective.  Blood pressure was elevated and amlodipine  was increased.  Today, the patient is overall doing good. No changes in health. she reports occasional low heart rate, but is unsure how low. She has not checked her heart rate at home. She has occasional dizziness and lightheadedness when she gets up. She denies chest pain, SOB or palpitations. She has dependent edema that is stable.   Studies Reviewed: Aaron Aas   EKG Interpretation Date/Time:  Thursday Sep 18 2023 13:36:17 EDT Ventricular Rate:  86 PR Interval:  212 QRS Duration:  142 QT Interval:  432 QTC Calculation: 516 R Axis:   98  Text  Interpretation: Normal sinus rhythm Right bundle branch block First degree A-V block Premature ventricular complexes When compared with ECG of 12-Jan-2020 01:20, Current undetermined rhythm precludes rhythm comparison, needs review Inverted T waves have replaced nonspecific T wave abnormality in Anterior leads Reconfirmed by Gennaro Khat, Abrahan Fulmore ([id number]) on 09/18/2023 2:08:35 PM    Heart monitor 2023  Basic rhythm is NSR with average HR 63 bpm ( range 44-115 bpm). PVC burden 6%. Dizzness did not corrlate with rhythm diisturbance. No correlation between complaints and arrhythmia     Patch Wear Time:  2 days and 20 hours (2023-05-18T23:13:31-0400 to 2023-05-21T20:01:07-398)   Patient had a min HR of 44 bpm, max HR of 115 bpm, and avg HR of 63 bpm. Predominant underlying rhythm was Sinus Rhythm. First Degree AV Block was present. Bundle Branch Block/IVCD was present. Isolated SVEs were rare (<1.0%), and no SVE Couplets or SVE  Triplets were present. Isolated VEs were frequent (6.0%, 15669), VE Couplets were rare (<1.0%, 759), and VE Triplets were rare (<1.0%, 61). Ventricular Bigeminy and Trigeminy were present.    Echo 02/2018 Study Conclusions   - Left ventricle: The cavity size was normal. Systolic function was    normal. The estimated ejection fraction was in the range of 50%    to 55%. Wall motion was normal; there were no regional wall    motion abnormalities. Doppler parameters are consistent with    abnormal left ventricular relaxation (grade 1 diastolic  dysfunction). Doppler parameters are consistent with high    ventricular filling pressure.  - Ventricular septum: There was a defect in the perimembranous    region.  - Aortic valve: Transvalvular velocity was within the normal range.    There was no stenosis. There was no regurgitation.  - Mitral valve: Moderate prolapse, involving the anterior leaflet.    Transvalvular velocity was within the normal range. There was no    evidence  for stenosis. There was no regurgitation.  - Right ventricle: The cavity size was normal. Wall thickness was    normal. Systolic function was normal.  - Atrial septum: No defect or patent foramen ovale was identified.  - Tricuspid valve: There was mild regurgitation.  - Pulmonic valve: The findings are consistent with moderate    stenosis. Peak gradient (S): 38 mm Hg.  - Pulmonary arteries: Systolic pressure was within the normal    range.   Impressions:   - Compared with the echo 11/2017, the mean pulmonic valve gradient    has increased from 11 mmHg to 21 mmHg.    Left cardiac cath 11/2017 Hemodynamic findings consistent with moderate pulmonary hypertension. Angiographically normal coronary arteries, somewhat tortuous There is mild pulmonic valve stenosis. LV end diastolic pressure is moderately elevated. There is severe left ventricular systolic dysfunction. The left ventricular ejection fraction is 25-35% by visual estimate. There is trivial (1+) mitral regurgitation. There is no aortic valve stenosis. There is mild mitral valve prolapse.   Angiographically normal coronary arteries. Severely reduced LVEF of roughly 25 to 30% with global hypokinesis (worse in the anterior and apical walls) -consistent with Takotsubo/stress-induced cardiomyopathy Mild mitral prolapse noted but no significant MR. Pulmonary valve gradient of roughly 15 mmHg on pullback. Cardiac output/index by Fick: 4.18, 2.52.  Thermodilution 6.22, 3.74 No evidence of shunt by Qp/Qs=1; significant mixing of blood from IVC, coronary sinus and SVC (IVC sat 76%, SVC sat 66%, RA sat 64% -> RV sat 74%, PA sat 75%)   The patient will be transferred to 6 Central post procedure unit for post femoral catheterization.  Sheath removal per protocol with manual pressure held.   Further evaluation and management per primary cardiology service.   No indication for antiplatelet therapy at this time.     Physical Exam:   VS:   BP 126/60 (BP Location: Left Arm)   Pulse 86   Ht 4\' 9"  (1.448 m)   Wt 135 lb 6.4 oz (61.4 kg)   SpO2 94%   BMI 29.30 kg/m    Wt Readings from Last 3 Encounters:  09/18/23 135 lb 6.4 oz (61.4 kg)  06/19/23 144 lb 3.2 oz (65.4 kg)  12/17/22 142 lb (64.4 kg)    GEN: Well nourished, well developed in no acute distress NECK: No JVD; No carotid bruits CARDIAC: RRR, + murmur, no rubs, gallops RESPIRATORY:  Clear to auscultation without rales, wheezing or rhonchi  ABDOMEN: Soft, non-tender, non-distended EXTREMITIES:  No edema; No deformity   ASSESSMENT AND PLAN: .    PVCs EKG today shows NSR with frequent PVCs at 86bpm. She has RBB at baseline with 1st degree AV block. She reports low heart rates at times, but unsure how low. Heart monitor in 2023 showed 6% PVC burden, average HR 53bpm. I will check BMET, CBC, TSH and Mag.  I will check a 1 week heart monitor.   VSD Prior heart cath showed significant shunt. Murmur on exam.   H/o stress induced CM LVEF improved to 50-55% by  echo in 2019. Patient is euvolemic. Patient is on Jardiance  10mg  daily and lasix  20mg  daily. She only takes lasix  as needed, I will send a refill of this.   HTN BP is normal, continue amlodipine  10mg  daily  HLD LDL 83. Continue Crestor  20mg  daily.        Dispo: 2 month follow-up  Signed, Dashiel Bergquist Rebekah [place], PA-C

## 2023-09-19 ENCOUNTER — Ambulatory Visit: Payer: Self-pay | Admitting: Medical

## 2023-09-19 LAB — CBC
Hematocrit: 43.3 % (ref 34.0–46.6)
Hemoglobin: 12.6 g/dL (ref 11.1–15.9)
MCH: 22.7 pg — ABNORMAL LOW (ref 26.6–33.0)
MCHC: 29.1 g/dL — ABNORMAL LOW (ref 31.5–35.7)
MCV: 78 fL — ABNORMAL LOW (ref 79–97)
Platelets: 255 10*3/uL (ref 150–450)
RBC: 5.55 x10E6/uL — ABNORMAL HIGH (ref 3.77–5.28)
RDW: 16.1 % — ABNORMAL HIGH (ref 11.7–15.4)
WBC: 8.6 10*3/uL (ref 3.4–10.8)

## 2023-09-19 LAB — BASIC METABOLIC PANEL WITH GFR
BUN/Creatinine Ratio: 31 — ABNORMAL HIGH (ref 12–28)
BUN: 15 mg/dL (ref 8–27)
CO2: 22 mmol/L (ref 20–29)
Calcium: 9.5 mg/dL (ref 8.7–10.3)
Chloride: 102 mmol/L (ref 96–106)
Creatinine, Ser: 0.49 mg/dL — ABNORMAL LOW (ref 0.57–1.00)
Glucose: 82 mg/dL (ref 70–99)
Potassium: 4 mmol/L (ref 3.5–5.2)
Sodium: 141 mmol/L (ref 134–144)
eGFR: 106 mL/min/{1.73_m2} (ref >59)

## 2023-09-19 LAB — MAGNESIUM: Magnesium: 2.2 mg/dL (ref 1.6–2.3)

## 2023-09-19 LAB — TSH: TSH: 2.5 u[IU]/mL (ref 0.450–4.500)

## 2023-09-19 NOTE — Telephone Encounter (Signed)
 Patient returned RN's call.

## 2023-09-25 ENCOUNTER — Other Ambulatory Visit: Payer: Self-pay | Admitting: Nurse Practitioner

## 2023-10-08 ENCOUNTER — Other Ambulatory Visit: Payer: Self-pay | Admitting: Nurse Practitioner

## 2023-10-08 DIAGNOSIS — G3184 Mild cognitive impairment, so stated: Secondary | ICD-10-CM

## 2023-10-08 DIAGNOSIS — K219 Gastro-esophageal reflux disease without esophagitis: Secondary | ICD-10-CM

## 2023-10-31 ENCOUNTER — Other Ambulatory Visit: Payer: Self-pay | Admitting: Medical

## 2023-10-31 DIAGNOSIS — Z8679 Personal history of other diseases of the circulatory system: Secondary | ICD-10-CM

## 2023-10-31 DIAGNOSIS — Z79899 Other long term (current) drug therapy: Secondary | ICD-10-CM

## 2023-11-09 DIAGNOSIS — I493 Ventricular premature depolarization: Secondary | ICD-10-CM | POA: Diagnosis not present

## 2023-11-18 ENCOUNTER — Ambulatory Visit (INDEPENDENT_AMBULATORY_CARE_PROVIDER_SITE_OTHER): Admitting: Nurse Practitioner

## 2023-11-18 ENCOUNTER — Encounter: Payer: Self-pay | Admitting: Medical

## 2023-11-18 ENCOUNTER — Ambulatory Visit: Attending: Medical | Admitting: Medical

## 2023-11-18 VITALS — BP 106/80 | HR 63 | Temp 98.5°F | Ht <= 58 in | Wt 131.4 lb

## 2023-11-18 VITALS — BP 142/60 | HR 67 | Ht <= 58 in | Wt 132.0 lb

## 2023-11-18 DIAGNOSIS — E119 Type 2 diabetes mellitus without complications: Secondary | ICD-10-CM

## 2023-11-18 DIAGNOSIS — G459 Transient cerebral ischemic attack, unspecified: Secondary | ICD-10-CM | POA: Diagnosis not present

## 2023-11-18 DIAGNOSIS — Q210 Ventricular septal defect: Secondary | ICD-10-CM | POA: Diagnosis not present

## 2023-11-18 DIAGNOSIS — Z7984 Long term (current) use of oral hypoglycemic drugs: Secondary | ICD-10-CM

## 2023-11-18 DIAGNOSIS — E782 Mixed hyperlipidemia: Secondary | ICD-10-CM | POA: Insufficient documentation

## 2023-11-18 DIAGNOSIS — I10 Essential (primary) hypertension: Secondary | ICD-10-CM | POA: Diagnosis not present

## 2023-11-18 DIAGNOSIS — Z8679 Personal history of other diseases of the circulatory system: Secondary | ICD-10-CM | POA: Insufficient documentation

## 2023-11-18 DIAGNOSIS — G3184 Mild cognitive impairment, so stated: Secondary | ICD-10-CM

## 2023-11-18 DIAGNOSIS — E538 Deficiency of other specified B group vitamins: Secondary | ICD-10-CM | POA: Insufficient documentation

## 2023-11-18 DIAGNOSIS — J029 Acute pharyngitis, unspecified: Secondary | ICD-10-CM

## 2023-11-18 DIAGNOSIS — I493 Ventricular premature depolarization: Secondary | ICD-10-CM | POA: Insufficient documentation

## 2023-11-18 LAB — POCT GLYCOSYLATED HEMOGLOBIN (HGB A1C): Hemoglobin A1C: 6.1 % — AB (ref 4.0–5.6)

## 2023-11-18 LAB — MICROALBUMIN / CREATININE URINE RATIO
Creatinine,U: 75.5 mg/dL
Microalb Creat Ratio: 36.6 mg/g — ABNORMAL HIGH (ref 0.0–30.0)
Microalb, Ur: 2.8 mg/dL — ABNORMAL HIGH (ref 0.0–1.9)

## 2023-11-18 LAB — VITAMIN B12: Vitamin B-12: 231 pg/mL (ref 211–911)

## 2023-11-18 LAB — LIPID PANEL
Cholesterol: 185 mg/dL (ref 0–200)
HDL: 60.3 mg/dL (ref >39.00)
LDL Cholesterol: 108 mg/dL — ABNORMAL HIGH (ref 0–99)
NonHDL: 124.52
Total CHOL/HDL Ratio: 3
Triglycerides: 85 mg/dL (ref 0.0–149.0)
VLDL: 17 mg/dL (ref 0.0–40.0)

## 2023-11-18 LAB — POCT RAPID STREP A (OFFICE): Rapid Strep A Screen: NEGATIVE

## 2023-11-18 MED ORDER — FLUTICASONE PROPIONATE 50 MCG/ACT NA SUSP
1.0000 | Freq: Every day | NASAL | 0 refills | Status: AC
Start: 2023-11-18 — End: ?

## 2023-11-18 NOTE — Assessment & Plan Note (Signed)
 Patient on amlodipine  10mg  daily and lasix  20mg  every day PRN. BP controlled. Recent OV with cardiology. Continue medications

## 2023-11-18 NOTE — Assessment & Plan Note (Signed)
 [facility]SE adminsitered today. Stable. Continue donepezil  5mg  at bedtime

## 2023-11-18 NOTE — Patient Instructions (Signed)
 Medication Instructions:  Your physician recommends that you continue on your current medications as directed. Please refer to the Current Medication list given to you today.    *If you need a refill on your cardiac medications before your next appointment, please call your pharmacy*  Lab Work: No labs ordered today    Testing/Procedures: Your physician has requested that you have an echocardiogram. Echocardiography is a painless test that uses sound waves to create images of your heart. It provides your doctor with information about the size and shape of your heart and how well your heart's chambers and valves are working.   You may receive an ultrasound enhancing agent through an IV if needed to better visualize your heart during the echo. This procedure takes approximately one hour.  There are no restrictions for this procedure.  This will take place at [address] [facility] [address], [place] [address]  Please note: We ask at that you not bring children with you during ultrasound (echo/ vascular) testing. Due to room size and safety concerns, children are not allowed in the ultrasound rooms during exams. Our front office staff cannot provide observation of children in our lobby area while testing is being conducted. An adult accompanying a patient to their appointment will only be allowed in the ultrasound room at the discretion of the ultrasound technician under special circumstances. We apologize for any inconvenience.   Follow-Up: At [facility], you and your health needs are our priority.  As part of our continuing mission to provide you with exceptional heart care, our providers are all part of one team.  This team includes your primary Cardiologist (physician) and Advanced Practice Providers or APPs (Physician Assistants and Nurse Practitioners) who all work together to provide you with the care you need, when you need it.  Your next appointment:   4  month(s)  Provider:   You may see Timothy Gollan, MD or one of the following Advanced Practice Providers on your designated Care Team:   Cadence [place], PA-C

## 2023-11-18 NOTE — Assessment & Plan Note (Signed)
 A!C well controlled today. Continue januvia 10 mg daily

## 2023-11-18 NOTE — Assessment & Plan Note (Signed)
 Hx of the same in setting of MCI, pending vitamin B12 level today

## 2023-11-18 NOTE — Progress Notes (Unsigned)
 Cardiology Office Note   Date:  11/19/2023  ID:  Stephanie Bray, DOB 06/02/1960, MRN [id number] PCP: Wendee Lynwood HERO, NP  [facility] Providers Cardiologist:  Evalene Lunger, MD   History of Present Illness Stephanie Bray is a 63 y.o. female with a hx of hypertension, DM2, hyperlipidemia, diabetes, TIA, stress-induced CM with improvement of EF, and VSD who is being seen for heart monitor follow-up.   Patient was admitted in July 2020 with chest pain and bloody nose.  Right and left heart cath showed tortuous coronaries.  Noted to have moderate PAH and severe LV dysfunction consistent with Takotsubo's cardiomyopathy.  Her pulmonary valve gradient on pullback during right heart cath was 15 mmHg.  No significant shunt by Qp/Qs. Repeat echo 3 months later showed improvement of EF 50 to 55%.  She again had a perimembranous VSD noted.  She was noted to have moderate anterior leaflet mitral valve prolapse without significant regurgitation.  RV size and function were normal.  Pulmonic valve gradient was noted to be elevated from 11 to 21 mmHg compared to prior.   Patient was seen in May 2023 with concern for elevated blood pressure.  A 3-day heart monitor was ordered to quantify PVC burden.  This showed 6% PVC burden.  The patient was last seen 09/18/23 and was overall stable from a cardiac perspective. Repeat ehart monitor showed NSR, 23% ventricular ectopy  The patient reports occasional heart racing and fluttering. She denies chest pain or SOB. She has stable lower leg edema. She has occasional orthostasis. Heart monitor was reviewed.  Studies Reviewed EKG Interpretation Date/Time:  Tuesday November 18 2023 15:56:41 EDT Ventricular Rate:  67 PR Interval:  210 QRS Duration:  142 QT Interval:  432 QTC Calculation: 456 R Axis:   91  Text Interpretation: Sinus rhythm with 1st degree A-V block Right bundle branch block When compared with ECG of 18-Sep-2023 13:36, Premature ventricular complexes  are no longer Present QT has shortened Confirmed by Franchester, Orlean Holtrop ([id number]) on 11/18/2023 4:22:34 PM    Heart monitor 11/2023 Holter monitor Patch Wear Time:  4 days and 17 hours (2025-06-07T17:07:06-398 to 2025-06-12T10:39:19-0400)   Normal sinus rhythm min HR of 41 bpm, max HR of 169 bpm,  avg HR of 64 bpm.    3 Ventricular Tachycardia runs occurred, the run with the fastest interval lasting 4 beats with a max rate of 169 bpm, the longest lasting 4 beats with an avg rate of 111 bpm.    Isolated SVEs were rare (<1.0%), and no SVE Couplets or SVE Triplets were present.    Isolated VEs were frequent (23.0%, [id number]), VE Couplets were occasional (1.2%, 2492), and VE Triplets were rare (<1.0%, 150). Ventricular Bigeminy and Trigeminy were present.   No patient triggered events recorded  Heart monitor 2023   Basic rhythm is NSR with average HR 63 bpm ( range 44-115 bpm). PVC burden 6%. Dizzness did not corrlate with rhythm diisturbance. No correlation between complaints and arrhythmia     Patch Wear Time:  2 days and 20 hours (2023-05-18T23:13:31-0400 to 2023-05-21T20:01:07-398)   Patient had a min HR of 44 bpm, max HR of 115 bpm, and avg HR of 63 bpm. Predominant underlying rhythm was Sinus Rhythm. First Degree AV Block was present. Bundle Branch Block/IVCD was present. Isolated SVEs were rare (<1.0%), and no SVE Couplets or SVE  Triplets were present. Isolated VEs were frequent (6.0%, 15669), VE Couplets were rare (<1.0%, 759), and VE Triplets were rare (<1.0%,  61). Ventricular Bigeminy and Trigeminy were present.     Echo 02/2018 Study Conclusions   - Left ventricle: The cavity size was normal. Systolic function was    normal. The estimated ejection fraction was in the range of 50%    to 55%. Wall motion was normal; there were no regional wall    motion abnormalities. Doppler parameters are consistent with    abnormal left ventricular relaxation (grade 1 diastolic    dysfunction).  Doppler parameters are consistent with high    ventricular filling pressure.  - Ventricular septum: There was a defect in the perimembranous    region.  - Aortic valve: Transvalvular velocity was within the normal range.    There was no stenosis. There was no regurgitation.  - Mitral valve: Moderate prolapse, involving the anterior leaflet.    Transvalvular velocity was within the normal range. There was no    evidence for stenosis. There was no regurgitation.  - Right ventricle: The cavity size was normal. Wall thickness was    normal. Systolic function was normal.  - Atrial septum: No defect or patent foramen ovale was identified.  - Tricuspid valve: There was mild regurgitation.  - Pulmonic valve: The findings are consistent with moderate    stenosis. Peak gradient (S): 38 mm Hg.  - Pulmonary arteries: Systolic pressure was within the normal    range.   Impressions:   - Compared with the echo 11/2017, the mean pulmonic valve gradient    has increased from 11 mmHg to 21 mmHg.    Left cardiac cath 11/2017 Hemodynamic findings consistent with moderate pulmonary hypertension. Angiographically normal coronary arteries, somewhat tortuous There is mild pulmonic valve stenosis. LV end diastolic pressure is moderately elevated. There is severe left ventricular systolic dysfunction. The left ventricular ejection fraction is 25-35% by visual estimate. There is trivial (1+) mitral regurgitation. There is no aortic valve stenosis. There is mild mitral valve prolapse.   Angiographically normal coronary arteries. Severely reduced LVEF of roughly 25 to 30% with global hypokinesis (worse in the anterior and apical walls) -consistent with Takotsubo/stress-induced cardiomyopathy Mild mitral prolapse noted but no significant MR. Pulmonary valve gradient of roughly 15 mmHg on pullback. Cardiac output/index by Fick: 4.18, 2.52.  Thermodilution 6.22, 3.74 No evidence of shunt by Qp/Qs=1; significant  mixing of blood from IVC, coronary sinus and SVC (IVC sat 76%, SVC sat 66%, RA sat 64% -> RV sat 74%, PA sat 75%)   The patient will be transferred to 6 Central post procedure unit for post femoral catheterization.  Sheath removal per protocol with manual pressure held.   Further evaluation and management per primary cardiology service.   No indication for antiplatelet therapy at this time.          Physical Exam VS:  BP (!) 142/60 (BP Location: Left Arm, Patient Position: Sitting, Cuff Size: Normal)   Pulse 67   Ht 4' 9 (1.448 m)   Wt 132 lb (59.9 kg)   SpO2 97%   BMI 28.56 kg/m        Wt Readings from Last 3 Encounters:  11/18/23 132 lb (59.9 kg)  11/18/23 131 lb 6.4 oz (59.6 kg)  09/18/23 135 lb 6.4 oz (61.4 kg)    GEN: Well nourished, well developed in no acute distress NECK: No JVD; No carotid bruits CARDIAC: RRR, + murmurs, rubs, gallops RESPIRATORY:  Clear to auscultation without rales, wheezing or rhonchi  ABDOMEN: Soft, non-tender, non-distended EXTREMITIES:  trace lower leg edema; No  deformity   ASSESSMENT AND PLAN  PVCs Heart monitor showed NSR, average HR 63bpm, frequent ventricular ectopy of 23%. Thuis is up from prior heart monitor at 6% PVC burden.  She is overall asymptomatic with occasional fluttering. Heart rate borderline so I will not start BB. I will update an echo to assess EF. I will refer to EP for further recommendations.   VSD Murmur on exam. No shunt on cath in 2019.  H/o stress induced cardiomyopathy LVEF improved to 50-55% by echo in 2019. LHC in 2019 showed essentially normal coronaries. Patient has trace lower leg edema on exam. Continue Jardiance  10mg  daily and lasix  20mg  as needed.   HTN BP is reasonable. Conintue amlodipine  10mg  daily.   HLD LDL 108. Continue Crestor  20mg  daily. Patient reports healthy diet changes.        Dispo: Follow-up in 4 months  Signed, Kylii Ennis VEAR Fishman, PA-C

## 2023-11-18 NOTE — Progress Notes (Signed)
 Established Patient Office Visit  Subjective   Patient ID: Stephanie Bray, female    DOB: [birth year]  Age: 62 y.o. MRN: [id number]  Chief Complaint  Patient presents with   Follow-up    HPI   DM2: Patient currently maintained on Jardiance  10 mg daily.  Does not check sugars at home. States that she is eating healier doing fish and veggies. She has cut back and not eaten at night   Hypertension: Patient currently maintained on amlodipine  10 mg daily, furosemide  20 mg daily as needed.  Patient is followed by cardiology  Memory concern: patient was seen on 06/19/2023 and started on donepezil  5mg  qhs and is here for follow up. States that she feels like it is helping. States that she is not blocking out as much. States family has not said anything in a difference. States that she is able to remember conversations when she has them   Sore throat: has been present for several weeks. No sick contacts. States some days it gets worse. States that tried mucinex  that did help      11/18/2023   11:16 AM 06/19/2023   11:08 AM  MMSE - Mini Mental State Exam  Orientation to time 5 5  Orientation to Place 4 4  Registration 3 3  Attention/ Calculation 0 4  Recall 1 0  Language- name 2 objects 2 2  Language- repeat 1 1  Language- follow 3 step command 3 2  Language- read & follow direction 1 1  Write a sentence 1 1  Copy design 0 0  Total score 21 23       Review of Systems  Constitutional:  Negative for chills and fever.  HENT:  Positive for sore throat.   Respiratory:  Negative for shortness of breath.   Cardiovascular:  Negative for chest pain.  Neurological:  Negative for headaches.  Psychiatric/Behavioral:  Negative for hallucinations and suicidal ideas.       Objective:     BP 106/80   Pulse 63   Temp 98.5 F (36.9 C) (Oral)   Ht 4' 9 (1.448 m)   Wt 131 lb 6.4 oz (59.6 kg)   SpO2 98%   BMI 28.43 kg/m  BP Readings from Last 3 Encounters:  11/18/23 106/80   09/18/23 126/60  06/19/23 120/80   Wt Readings from Last 3 Encounters:  11/18/23 131 lb 6.4 oz (59.6 kg)  09/18/23 135 lb 6.4 oz (61.4 kg)  06/19/23 144 lb 3.2 oz (65.4 kg)   SpO2 Readings from Last 3 Encounters:  11/18/23 98%  09/18/23 94%  06/19/23 94%      Physical Exam Vitals and nursing note reviewed.  Constitutional:      Appearance: Normal appearance.  HENT:     Mouth/Throat:     Mouth: Mucous membranes are moist.     Pharynx: Oropharynx is clear. No posterior oropharyngeal erythema.  Cardiovascular:     Rate and Rhythm: Normal rate and regular rhythm.     Heart sounds: Normal heart sounds.  Pulmonary:     Effort: Pulmonary effort is normal.     Breath sounds: Normal breath sounds.  Lymphadenopathy:     Cervical: No cervical adenopathy.  Neurological:     Mental Status: She is alert.      Results for orders placed or performed in visit on 11/18/23  POCT glycosylated hemoglobin (Hb A1C)  Result Value Ref Range   Hemoglobin A1C 6.1 (A) 4.0 - 5.6 %   HbA1c  POC (<> result, manual entry)     HbA1c, POC (prediabetic range)     HbA1c, POC (controlled diabetic range)    Rapid Strep A  Result Value Ref Range   Rapid Strep A Screen Negative Negative      The ASCVD Risk score (Arnett DK, et al., 2019) failed to calculate for the following reasons:   Risk score cannot be calculated because patient has a medical history suggesting prior/existing ASCVD    Assessment & Plan:   Problem List Items Addressed This Visit       Cardiovascular and Mediastinum   Essential hypertension   Patient on amlodipine  10mg  daily and lasix  20mg  every day PRN. BP controlled. Recent OV with cardiology. Continue medications       Relevant Orders   Lipid panel   Microalbumin / creatinine urine ratio     Endocrine   Diabetes mellitus without complication (HCC) - Primary   A!C well controlled today. Continue januvia 10 mg daily       Relevant Orders   POCT glycosylated  hemoglobin (Hb A1C) (Completed)   Lipid panel   Microalbumin / creatinine urine ratio     Other   MCI (mild cognitive impairment)   MMSE adminsitered today. Stable. Continue donepezil  5mg  at bedtime       Sore throat   Strep test negative in office. Likely secondary to PND. Flonase  nasal spray along with nasal saline to prevent nosebleeds.       Relevant Medications   fluticasone  (FLONASE ) 50 MCG/ACT nasal spray   Other Relevant Orders   Rapid Strep A (Completed)   Vitamin B12 deficiency   Hx of the same in setting of MCI, pending vitamin B12 level today       Relevant Orders   Vitamin B12    Return in about 6 months (around 05/20/2024) for DM recheck.    Adina Crandall, NP

## 2023-11-18 NOTE — Assessment & Plan Note (Signed)
 Strep test negative in office. Likely secondary to PND. Flonase  nasal spray along with nasal saline to prevent nosebleeds.

## 2023-11-18 NOTE — Patient Instructions (Addendum)
 Nice to see you today Strep test was negative in office I have sent in some nose spray to help with the sore throat. Use some nasal saline over the counter along with the nasal spray to prevent nose bleeds Follow up with me in 6 months, sooner if you need me

## 2023-11-19 ENCOUNTER — Ambulatory Visit: Payer: Self-pay | Admitting: Nurse Practitioner

## 2023-11-19 DIAGNOSIS — E1129 Type 2 diabetes mellitus with other diabetic kidney complication: Secondary | ICD-10-CM

## 2023-11-19 MED ORDER — LOSARTAN POTASSIUM 25 MG PO TABS: 25.0000 mg | ORAL_TABLET | Freq: Every day | ORAL | 0 refills | Status: DC

## 2023-11-25 NOTE — Telephone Encounter (Signed)
 Pt received her letter and would like to go over results.

## 2023-11-27 ENCOUNTER — Telehealth: Payer: Self-pay | Admitting: Cardiovascular Disease

## 2023-11-27 NOTE — Telephone Encounter (Signed)
 Patient wants a call back regarding heart monitor results.

## 2023-11-27 NOTE — Telephone Encounter (Signed)
 Left voicemail message to call back

## 2023-11-28 NOTE — Telephone Encounter (Signed)
 Pt returning call

## 2023-11-28 NOTE — Telephone Encounter (Signed)
 Left voicemail message to call back

## 2023-12-18 ENCOUNTER — Other Ambulatory Visit: Payer: Self-pay | Admitting: Nurse Practitioner

## 2023-12-18 DIAGNOSIS — J029 Acute pharyngitis, unspecified: Secondary | ICD-10-CM

## 2023-12-22 ENCOUNTER — Other Ambulatory Visit

## 2023-12-26 ENCOUNTER — Other Ambulatory Visit

## 2023-12-26 DIAGNOSIS — R809 Proteinuria, unspecified: Secondary | ICD-10-CM | POA: Diagnosis not present

## 2023-12-26 DIAGNOSIS — E1129 Type 2 diabetes mellitus with other diabetic kidney complication: Secondary | ICD-10-CM | POA: Diagnosis not present

## 2023-12-26 LAB — BASIC METABOLIC PANEL WITH GFR
BUN: 17 mg/dL (ref 6–23)
CO2: 31 meq/L (ref 19–32)
Calcium: 9.9 mg/dL (ref 8.4–10.5)
Chloride: 103 meq/L (ref 96–112)
Creatinine, Ser: 0.57 mg/dL (ref 0.40–1.20)
GFR: 97.05 mL/min (ref >60.00)
Glucose, Bld: 104 mg/dL — ABNORMAL HIGH (ref 70–99)
Potassium: 4.5 meq/L (ref 3.5–5.1)
Sodium: 141 meq/L (ref 135–145)

## 2023-12-28 ENCOUNTER — Ambulatory Visit: Payer: Self-pay | Admitting: Family

## 2023-12-31 ENCOUNTER — Ambulatory Visit: Attending: Medical

## 2023-12-31 DIAGNOSIS — Q210 Ventricular septal defect: Secondary | ICD-10-CM | POA: Diagnosis present

## 2023-12-31 DIAGNOSIS — I493 Ventricular premature depolarization: Secondary | ICD-10-CM | POA: Insufficient documentation

## 2023-12-31 LAB — ECHOCARDIOGRAM COMPLETE
AV Mean grad: 4 mmHg
AV Peak grad: 7.5 mmHg
Ao pk vel: 1.37 m/s
Area-P 1/2: 3.85 cm2
S' Lateral: 3.66 cm

## 2023-12-31 NOTE — Telephone Encounter (Signed)
 Copied from CRM #[id number]. Topic: Clinical - Lab/Test Results >> Dec 31, 2023  8:56 AM Thersia BROCKS wrote: Reason for CRM: Patient called in regarding lab result, relay results, patient had no questions and understood

## 2024-01-01 ENCOUNTER — Ambulatory Visit: Payer: Self-pay | Admitting: Medical

## 2024-01-07 ENCOUNTER — Institutional Professional Consult (permissible substitution): Admitting: Cardiology

## 2024-01-16 ENCOUNTER — Other Ambulatory Visit: Payer: Self-pay | Admitting: Nurse Practitioner

## 2024-01-16 DIAGNOSIS — Z1231 Encounter for screening mammogram for malignant neoplasm of breast: Secondary | ICD-10-CM

## 2024-01-21 ENCOUNTER — Other Ambulatory Visit: Payer: Self-pay | Admitting: Cardiology

## 2024-01-21 ENCOUNTER — Other Ambulatory Visit: Payer: Self-pay | Admitting: Nurse Practitioner

## 2024-01-21 DIAGNOSIS — E1129 Type 2 diabetes mellitus with other diabetic kidney complication: Secondary | ICD-10-CM

## 2024-02-06 ENCOUNTER — Inpatient Hospital Stay: Admission: RE | Admit: 2024-02-06 | Payer: Medicare Other | Source: Ambulatory Visit

## 2024-02-18 ENCOUNTER — Institutional Professional Consult (permissible substitution): Admitting: Cardiology

## 2024-03-08 ENCOUNTER — Ambulatory Visit
Admission: RE | Admit: 2024-03-08 | Discharge: 2024-03-08 | Disposition: A | Discharge status: Home / Self Care | Source: Ambulatory Visit | Attending: Nurse Practitioner | Admitting: Nurse Practitioner

## 2024-03-08 ENCOUNTER — Other Ambulatory Visit: Payer: Self-pay | Admitting: Nurse Practitioner

## 2024-03-08 DIAGNOSIS — Z1231 Encounter for screening mammogram for malignant neoplasm of breast: Secondary | ICD-10-CM

## 2024-03-10 ENCOUNTER — Ambulatory Visit: Admitting: Nurse Practitioner

## 2024-03-10 VITALS — BP 118/70 | HR 57 | Temp 98.6°F | Ht <= 58 in | Wt 127.2 lb

## 2024-03-10 DIAGNOSIS — J029 Acute pharyngitis, unspecified: Secondary | ICD-10-CM

## 2024-03-10 DIAGNOSIS — K219 Gastro-esophageal reflux disease without esophagitis: Secondary | ICD-10-CM | POA: Diagnosis not present

## 2024-03-10 DIAGNOSIS — Z23 Encounter for immunization: Secondary | ICD-10-CM | POA: Diagnosis not present

## 2024-03-10 DIAGNOSIS — E119 Type 2 diabetes mellitus without complications: Secondary | ICD-10-CM | POA: Diagnosis not present

## 2024-03-10 DIAGNOSIS — R202 Paresthesia of skin: Secondary | ICD-10-CM | POA: Diagnosis not present

## 2024-03-10 DIAGNOSIS — R4189 Other symptoms and signs involving cognitive functions and awareness: Secondary | ICD-10-CM

## 2024-03-10 LAB — POCT GLYCOSYLATED HEMOGLOBIN (HGB A1C): Hemoglobin A1C: 6 % — AB (ref 4.0–5.6)

## 2024-03-10 MED ORDER — LANCETS MISC: 1.0000 | Freq: Every day | 1 refills | Status: AC

## 2024-03-10 MED ORDER — BLOOD GLUCOSE TEST VI STRP: 1.0000 | ORAL_STRIP | Freq: Every day | 1 refills | Status: AC

## 2024-03-10 MED ORDER — BLOOD GLUCOSE MONITORING SUPPL DEVI
1.0000 | 0 refills | Status: AC
Start: 2024-03-10 — End: ?

## 2024-03-10 MED ORDER — LANCET DEVICE MISC: 1.0000 | 0 refills | Status: AC

## 2024-03-10 NOTE — Addendum Note (Signed)
 Addended by: SEBASTIAN SHU on: 03/10/2024 03:52 PM   Modules accepted: Orders

## 2024-03-10 NOTE — Progress Notes (Signed)
 Established Patient Office Visit  Subjective   Patient ID: Stephanie Bray, female    DOB: 1960/12/08  Age: 62 y.o. MRN: [id number]  Chief Complaint  Patient presents with   Diabetes    HPI  Discussed the use of AI scribe software for clinical note transcription with the patient, who gave verbal consent to proceed.  History of Present Illness Stephanie Bray is a 63 year old female with diabetes who presents with weight loss and sore throat.  She has experienced recent weight loss, dropping from 132 pounds in July to 122 pounds, but her weight has since rebounded to 126 pounds. She has been off Jardiance  for about a month, which she was previously taking for diabetes management. Currently, she takes a 'food pill' and vitamins, eats two meals a day with sugar-free snacks, and drinks sugar-free iced coffee, diet green tea, and water. She walked to the mailbox today. She has not been provided with a glucose meter to monitor her blood sugar at home. Her last A1c was 6.0, down from 6.1.  She experiences a persistent sore throat, particularly noticeable in the mornings. She takes omeprazole  for acid reduction, which she associates with heartburn. The sore throat does not seem to worsen after eating. She occasionally coughs or clears her throat during the day. She uses Flonase  nasal spray with the help of her son, but not daily.  She has memory concerns, previously discussed in July, and is currently on Aricept  (donepezil ). Her son feels her memory is worsening, though she notes some improvement with medication. She often forgets what she was going to say but does not repeat conversations. Memory tests conducted in February and July were stable.  She reports numbness and tingling in her fingers and toes, occurring intermittently throughout the day, lasting seconds. This sensation is not painful but feels like a 'sharp tingle.' She has had diabetes for a long time, though the exact duration is unknown.  Her B12 level was borderline low at the last check.  She occasionally feels dizzy, particularly when standing up quickly. No fever, chills, chest pain, or shortness of breath. She drinks plenty of fluids and tries to eat healthy, including fish, chicken, turkey, and vegetables.      11/18/2023   11:16 AM 06/19/2023   11:08 AM  MMSE - Mini Mental State Exam  Orientation to time 5 5  Orientation to Place 4 4  Registration 3 3  Attention/ Calculation 0 4  Recall 1 0  Language- name 2 objects 2 2  Language- repeat 1 1  Language- follow 3 step command 3 2  Language- read & follow direction 1 1  Write a sentence 1 1  Copy design 0 0  Total score 21 23     Review of Systems  Constitutional:  Negative for chills and fever.  HENT:  Positive for sore throat.   Respiratory:  Negative for shortness of breath.   Cardiovascular:  Negative for chest pain.  Neurological:  Positive for tingling and tremors. Negative for headaches.  Psychiatric/Behavioral:  Positive for memory loss.       Objective:     BP 118/70   Pulse (!) 57   Temp 98.6 F (37 C) (Oral)   Ht 4' 9 (1.448 m)   Wt 127 lb 3.2 oz (57.7 kg)   SpO2 96%   BMI 27.53 kg/m  BP Readings from Last 3 Encounters:  03/10/24 118/70  11/18/23 (!) 142/60  11/18/23 106/80   Wt  Readings from Last 3 Encounters:  03/10/24 127 lb 3.2 oz (57.7 kg)  11/18/23 132 lb (59.9 kg)  11/18/23 131 lb 6.4 oz (59.6 kg)   SpO2 Readings from Last 3 Encounters:  03/10/24 96%  11/18/23 97%  11/18/23 98%      Physical Exam Vitals and nursing note reviewed.  Constitutional:      Appearance: Normal appearance.  Cardiovascular:     Rate and Rhythm: Normal rate and regular rhythm.     Heart sounds: Normal heart sounds.  Pulmonary:     Effort: Pulmonary effort is normal.     Breath sounds: Normal breath sounds.  Lymphadenopathy:     Cervical: No cervical adenopathy.  Neurological:     Mental Status: She is alert.      Results for  orders placed or performed in visit on 03/10/24  POCT glycosylated hemoglobin (Hb A1C)  Result Value Ref Range   Hemoglobin A1C 6.0 (A) 4.0 - 5.6 %   HbA1c POC (<> result, manual entry)     HbA1c, POC (prediabetic range)     HbA1c, POC (controlled diabetic range)        The ASCVD Risk score (Arnett DK, et al., 2019) failed to calculate for the following reasons:   Risk score cannot be calculated because patient has a medical history suggesting prior/existing ASCVD    Assessment & Plan:   Problem List Items Addressed This Visit       Digestive   GERD (gastroesophageal reflux disease)     Endocrine   Diabetes mellitus without complication (HCC) - Primary   Relevant Orders   POCT glycosylated hemoglobin (Hb A1C) (Completed)     Other   Sore throat   Relevant Orders   Ambulatory referral to ENT   Other Visit Diagnoses       Paresthesia       Relevant Orders   Ambulatory referral to Neurology     Concern about memory       Relevant Orders   Ambulatory referral to Neurology      Assessment and Plan Assessment & Plan Type 2 diabetes mellitus Diabetes well-controlled with A1c of 6.0. Discontinued Jardiance  due to weight loss concerns. No home blood sugar monitoring. - Discontinue Jardiance . - Prescribe blood glucose meter. - Encourage dietary intake of at least two meals a day with snacks, focusing on sugar-free options. - Schedule follow-up in 3-4 weeks to monitor weight and blood sugar levels.  Unintentional weight loss Unintentional weight loss from 132 lbs to 126 lbs. Cause unclear, possibly medication-related. - Monitor weight and dietary intake, ensuring at least two meals a day with snacks. - Schedule follow-up in 3-4 weeks to reassess weight trends. - Advise to contact the office if weight loss accelerates.  Mild cognitive impairment Managed with donepezil . Reports memory issues without significant worsening. Neurologist referral considered. - Continue  donepezil  5 mg qhs. - Refer to a neurologist for further evaluation of memory concerns.  Chronic sore throat Possibly related to silent reflux or ENT issues. Reports morning sore throat, possibly reflux-related. Inconsistent use of omeprazole  and Flonase . - Continue omeprazole  for acid reflux management. - Encourage daily use of Flonase  nasal spray. - Refer to an ENT specialist in [facility] for further evaluation.  Peripheral neuropathy and vitamin B12 deficiency Intermittent numbness and tingling in fingers and toes, possibly related to diabetes or B12 deficiency. B12 levels previously borderline low. - Start over-the-counter vitamin B12 at 500mcg daily. - Refer to a neurologist for further evaluation  of neuropathy symptoms.  General Health Maintenance Due for flu shot. Recent mammogram completed, results pending. - Administer flu shot today. - Review mammogram results once available.  Return in about 4 weeks (around 04/07/2024) for weight recheck .    Adina Crandall, NP

## 2024-03-10 NOTE — Patient Instructions (Addendum)
 Nice to see you today I want to make sure that you are doing the Flonase  (fluticasone ) daily  Also want to make sure that you take the omeprazole  (Prilosec 40mg ) daily  I want you to pick up vitamin B12 500mcg over the counter and take it daily   Follow up with me iin 1 month for a weight recheck

## 2024-03-18 ENCOUNTER — Other Ambulatory Visit: Payer: Self-pay | Admitting: Nurse Practitioner

## 2024-03-18 DIAGNOSIS — K219 Gastro-esophageal reflux disease without esophagitis: Secondary | ICD-10-CM

## 2024-03-18 DIAGNOSIS — G3184 Mild cognitive impairment, so stated: Secondary | ICD-10-CM

## 2024-04-02 ENCOUNTER — Ambulatory Visit (INDEPENDENT_AMBULATORY_CARE_PROVIDER_SITE_OTHER): Admitting: Otolaryngology

## 2024-04-02 ENCOUNTER — Encounter (INDEPENDENT_AMBULATORY_CARE_PROVIDER_SITE_OTHER): Payer: Self-pay | Admitting: Otolaryngology

## 2024-04-02 VITALS — BP 124/50 | HR 64 | Temp 97.7°F | Wt 127.0 lb

## 2024-04-02 DIAGNOSIS — J029 Acute pharyngitis, unspecified: Secondary | ICD-10-CM

## 2024-04-02 DIAGNOSIS — K219 Gastro-esophageal reflux disease without esophagitis: Secondary | ICD-10-CM

## 2024-04-02 MED ORDER — AMOXICILLIN-POT CLAVULANATE 875-125 MG PO TABS: 1.0000 | ORAL_TABLET | Freq: Two times a day (BID) | ORAL | 0 refills | Status: AC

## 2024-04-02 NOTE — Progress Notes (Signed)
 Reason for Consult: Sore throat Referring Physician: Dr. Wendee Niels JONETTA Stephanie Bray is an 63 y.o. female.  HPI: She is here for evaluation of 2 weeks of sore throat.  She says is not causing her to not be able to eat.  No hoarseness.  She feels like the throat is a bit tight here in the last couple of days.  The tightness is described as a dryness.  She does definitely have reflux problems in the past and does have breakthrough heartburn even on the proton pump inhibitor.  She might of had an upper respiratory infection at the onset of this 2 weeks ago.  She has not had this on a regular basis.  She has no difficulty getting air in or shortness of breath.  Past Medical History:  Diagnosis Date   Anxiety    Arthritis    left hip (04/25/2014)   Chest pain    a. 04/2014 MV: small anteroapical perfusion defect - likely breast attenuation->low risk.   Chronic bronchitis (HCC)    get it mostly q yr (04/25/2014)   Depression    Essential hypertension    GERD (gastroesophageal reflux disease)    Heart murmur    High cholesterol    Iron  deficiency anemia    Migraine    weekly, at least (04/25/2014)   Myocardial infarction [facility])    PTSD (post-traumatic stress disorder)    Stroke [facility])    VSD (ventricular septal defect)    a. 04/2014 Echo: EF 55-60%, PASP , small restrictive either supracristal or perimembranous VSD w/o PAH.    Past Surgical History:  Procedure Laterality Date   CHOLECYSTECTOMY     COLONOSCOPY WITH PROPOFOL  N/A 06/12/2021   Procedure: COLONOSCOPY WITH PROPOFOL ;  Surgeon: Jinny Carmine, MD;  Location: [facility] ENDOSCOPY;  Service: Endoscopy;  Laterality: N/A;   RIGHT/LEFT HEART CATH AND CORONARY ANGIOGRAPHY N/A 12/01/2017   Procedure: RIGHT/LEFT HEART CATH AND CORONARY ANGIOGRAPHY;  Surgeon: Anner Alm ORN, MD;  Location: [facility] INVASIVE CV LAB;  Service: Cardiovascular;  Laterality: N/A;   VSD REPAIR  ~ 1963   [facility]/notes 04/25/2014    Family History  Problem Relation Age  of Onset   COPD Mother    Hypertension Father    Hyperlipidemia Father    Breast cancer Neg Hx     Social History:  reports that she has never smoked. She has never used smokeless tobacco. She reports that she does not drink alcohol and does not use drugs.  Allergies:  Allergies  Allergen Reactions   Flonase  [Fluticasone  Propionate]     Nose bleed   Metformin  And Related Nausea Only   Other Other (See Comments)    Reaction unknown, per son   Ace Inhibitors     Other reaction(s): Cough    Medications: I have reviewed the patient's current medications.  No results found for this or any previous visit (from the past 48 hours).  No results found.  ROS Blood pressure (!) 124/50, pulse 64, temperature 97.7 F (36.5 C), weight 127 lb (57.6 kg), SpO2 96%. Physical Exam Constitutional:      Appearance: Normal appearance.  HENT:     Head: Normocephalic and atraumatic.     Right Ear: Tympanic membrane is without lesions and middle ear aerated, ear canal and external ear normal.     Left Ear: Tympanic membrane is without lesions and middle ear aerated, ear canal and external ear normal.     Nose: Nose normal. Turbinates with mild hypertrophy, No  significant swelling or masses.     Oral cavity/oropharynx: Mucous membranes are moist. No lesions or masses    Larynx: normal voice.  She is not struggling or having any stridor.  Mirror attempted without success    Eyes:     Extraocular Movements: Extraocular movements intact.     Conjunctiva/sclera: Conjunctivae normal.     Pupils: Pupils are equal, round, and reactive to light.  Cardiovascular:     Rate and Rhythm: Normal rate.  Pulmonary:     Effort: Pulmonary effort is normal.  Musculoskeletal:     Cervical back: Normal range of motion and neck supple. No rigidity.  Lymphadenopathy:     Cervical: No cervical adenopathy or masses.salivary glands without lesions. .  Neurological:     Mental Status: He is alert. CN 2-12 intact. No  nystagmus      Assessment/Plan: Pharyngitis-we talked about this being a breakthrough of a reflux versus a pharyngitis issue.  She would like to try antibiotics first.  We will then adjust the reflux medication to twice daily if she is not resolved after this 7 days of antibiotics.  If she gets worse she will come back sooner or go to the emergency room.  We talked about a fiberoptic exam but right now that does not seem necessary with 2 weeks history and no airway or significant pain issues.  Norleen Notice 04/02/2024, 11:02 AM

## 2024-04-13 ENCOUNTER — Institutional Professional Consult (permissible substitution): Admitting: Cardiology

## 2024-04-15 ENCOUNTER — Ambulatory Visit: Admitting: Nurse Practitioner

## 2024-04-26 ENCOUNTER — Ambulatory Visit: Payer: Self-pay

## 2024-04-26 ENCOUNTER — Emergency Department
Admission: EM | Admit: 2024-04-26 | Discharge: 2024-04-26 | Disposition: A | Discharge status: Home / Self Care | Attending: Emergency Medicine | Admitting: Emergency Medicine

## 2024-04-26 ENCOUNTER — Other Ambulatory Visit: Payer: Self-pay

## 2024-04-26 DIAGNOSIS — E119 Type 2 diabetes mellitus without complications: Secondary | ICD-10-CM | POA: Diagnosis not present

## 2024-04-26 DIAGNOSIS — I10 Essential (primary) hypertension: Secondary | ICD-10-CM | POA: Diagnosis not present

## 2024-04-26 DIAGNOSIS — W57XXXA Bitten or stung by nonvenomous insect and other nonvenomous arthropods, initial encounter: Secondary | ICD-10-CM

## 2024-04-26 DIAGNOSIS — Z23 Encounter for immunization: Secondary | ICD-10-CM | POA: Diagnosis not present

## 2024-04-26 DIAGNOSIS — M79641 Pain in right hand: Secondary | ICD-10-CM | POA: Diagnosis present

## 2024-04-26 LAB — CBC
HCT: 38.8 % (ref 36.0–46.0)
Hemoglobin: 12 g/dL (ref 12.0–15.0)
MCH: 23 pg — ABNORMAL LOW (ref 26.0–34.0)
MCHC: 30.9 g/dL (ref 30.0–36.0)
MCV: 74.5 fL — ABNORMAL LOW (ref 80.0–100.0)
Platelets: 256 K/uL (ref 150–400)
RBC: 5.21 MIL/uL — ABNORMAL HIGH (ref 3.87–5.11)
RDW: 17 % — ABNORMAL HIGH (ref 11.5–15.5)
WBC: 8.2 K/uL (ref 4.0–10.5)
nRBC: 0 % (ref 0.0–0.2)

## 2024-04-26 LAB — BASIC METABOLIC PANEL WITH GFR
Anion gap: 14 (ref 5–15)
BUN: 18 mg/dL (ref 8–23)
CO2: 24 mmol/L (ref 22–32)
Calcium: 10.1 mg/dL (ref 8.9–10.3)
Chloride: 101 mmol/L (ref 98–111)
Creatinine, Ser: 0.58 mg/dL (ref 0.44–1.00)
GFR, Estimated: >60 mL/min
Glucose, Bld: 104 mg/dL — ABNORMAL HIGH (ref 70–99)
Potassium: 3.9 mmol/L (ref 3.5–5.1)
Sodium: 140 mmol/L (ref 135–145)

## 2024-04-26 LAB — CBG MONITORING, ED: Glucose-Capillary: 100 mg/dL — ABNORMAL HIGH (ref 70–99)

## 2024-04-26 MED ORDER — TETANUS-DIPHTH-ACELL PERTUSSIS 5-2-15.5 LF-MCG/0.5 IM SUSP
0.5000 mL | Freq: Once | INTRAMUSCULAR | Status: AC
  Administered 2024-04-26: 0.5 mL via INTRAMUSCULAR
  Filled 2024-04-26: qty 0.5

## 2024-04-26 MED ORDER — ACETAMINOPHEN 500 MG PO TABS
1000.0000 mg | ORAL_TABLET | Freq: Once | ORAL | Status: AC
  Administered 2024-04-26: 1000 mg via ORAL
  Filled 2024-04-26: qty 2

## 2024-04-26 MED ORDER — BACITRACIN ZINC 500 UNIT/GM EX OINT
TOPICAL_OINTMENT | Freq: Two times a day (BID) | CUTANEOUS | Status: DC
  Filled 2024-04-26: qty 0.9

## 2024-04-26 MED ORDER — CEPHALEXIN 500 MG PO CAPS: 500.0000 mg | ORAL_CAPSULE | Freq: Two times a day (BID) | ORAL | 0 refills | Status: AC

## 2024-04-26 MED ORDER — BACITRACIN ZINC 500 UNIT/GM EX OINT: TOPICAL_OINTMENT | Freq: Two times a day (BID) | CUTANEOUS | Status: DC

## 2024-04-26 NOTE — ED Triage Notes (Signed)
 First nurse note: Pt to ED via [facility] from home. Pt reports right hand pain and possible spider. Swelling noted. Pt reports intermittent dizziness.   CBG 129 20g LAC  95% RA 165/70 HR 80

## 2024-04-26 NOTE — Telephone Encounter (Signed)
 Noted

## 2024-04-26 NOTE — Telephone Encounter (Signed)
 FYI Only or Action Required?: FYI only for provider: ED advised.  Patient was last seen in primary care on 03/10/2024 by Wendee Lynwood HERO, NP.  Called Nurse Triage reporting Blood Sugar Problem and Insect Bite.  Symptoms began yesterday.  Interventions attempted: Nothing.  Symptoms are: unchanged.  Triage Disposition: Go to ED Now (or PCP Triage), Call EMS 911 Now, See HCP Within 4 Hours (Or PCP Triage)  Patient/caregiver understands and will follow disposition?: Yes      Copied from CRM #[id number]. Topic: Clinical - Red Word Triage >> Apr 26, 2024 12:44 PM Jasmin G wrote: Red Word that prompted transfer to Nurse Triage: Pt states that her blood sugar was at 250 yesterday and feels like she needs to be seen ASAP. Reason for Disposition  [1] SEVERE bite pain (e.g., excruciating) AND [2] not improved after 2 hours of pain medicine  Sounds like a life-threatening emergency to the triager  SEVERE dizziness (e.g., unable to stand, requires support to walk, feels like passing out now)  Answer Assessment - Initial Assessment Questions Pt states that she bug bite that might be a recluse a couple of days ago. She states that her BS was 250 when he checked it. RN asked who checked it, she states 911 was called to her house last night due to the bite and she told him then she was dizzy (still dizzy) he checked her sugar which was 250. RN asked why she didn't go to ER last night. She states she decided to wait but thinks that she needs to go now. She states she wants to go to [place] location. She states when she stands up she feels like she is going to pass out. She states she is not on insulin  was on jardiance  and that was stopped due to weight loss. She states her right hand by her thumb is where the bug bite is and it is red and extends up into her hand. She also states she has some left side chest pain (mild) denies any shortness of breath.  Rn called 911 who stated no one was called to that  location last night. Her son called her so she switched over, call disconnected. Rn called pt back who states that EMS is there, Rn asked if she got a hold of son but she disconnected.     1. BLOOD GLUCOSE: What is your blood glucose level?      Last night it was 250 has no way to check it at home 2. ONSET: When did you check the blood glucose?     Last night 3. USUAL RANGE: What is your glucose level usually? (e.g., usual fasting morning value, usual evening value)     Unknown states they won't let her have a monitor 4. KETONES: Do you check for ketones (urine or blood test strips)? If Yes, ask: What does the test show now?      no 5. TYPE 1 or 2:  Do you know what type of diabetes you have?  (e.g., Type 1, Type 2, Gestational; doesn't know)      unknown 6. INSULIN : Do you take insulin ? What type of insulin (s) do you use? What is the mode of delivery? (syringe, pen; injection or pump)?      no 7. DIABETES PILLS: Do you take any pills for your diabetes? If Yes, ask: Have you missed taking any pills recently?     Was on jardiance  but been stopped 8. OTHER SYMPTOMS: Do you have any  symptoms? (e.g., fever, frequent urination, difficulty breathing, dizziness, weakness, vomiting)     Dizziness, feels like she is going to pass out, chest pain  Answer Assessment - Initial Assessment Questions 1. TYPE of INSECT: What type of insect was it?      Thinks brown recluse 2. ONSET: When did you get bitten?      A couple of days ago 3. LOCATION: Where is the insect bite located?      Right hand by thumb 4. REDNESS: Is the area red or pink? If Yes, ask: What size is the area of redness? (inches or cm). When did the redness start?     yes 5. PAIN: Is there any pain? If Yes, ask: How bad is the pain? (Scale 0-10; or none, mild, moderate, severe)     Moderate pain 6. ITCHING: Does it itch? If Yes, ask: How bad is the itch?       7. SWELLING: How big is the  swelling? (e.g., inches, cm, or compare to coins)     yes 8. OTHER SYMPTOMS: Do you have any other symptoms?  (e.g., difficulty breathing, fever, hives)     dizziness  Answer Assessment - Initial Assessment Questions 1. DESCRIPTION: Describe your dizziness.     Feels like she's going to pass out 2. LIGHTHEADED: Do you feel lightheaded? (e.g., somewhat faint, woozy, weak upon standing)     yes 3. VERTIGO: Do you feel like either you or the room is spinning or tilting? (i.e., vertigo)     no 4. SEVERITY: How bad is it?  Do you feel like you are going to faint? Can you stand and walk?     States she can but feels like she is going to pass out 5. ONSET:  When did the dizziness begin?      6. AGGRAVATING FACTORS: Does anything make it worse? (e.g., standing, change in head position)     standing 7. HEART RATE: Can you tell me your heart rate? How many beats in 15 seconds?  (Note: Not all patients can do this.)        8. CAUSE: What do you think is causing the dizziness? (e.g., decreased fluids or food, diarrhea, emotional distress, heat exposure, new medicine, sudden standing, vomiting; unknown)      9. RECURRENT SYMPTOM: Have you had dizziness before? If Yes, ask: When was the last time? What happened that time?      10. OTHER SYMPTOMS: Do you have any other symptoms? (e.g., fever, chest pain, vomiting, diarrhea, bleeding)       Chest pain  Protocols used: Insect Bite-A-AH, Spider Bite - [place]-A-AH, Diabetes - High Blood Sugar-A-AH, Dizziness - Lightheadedness-A-AH

## 2024-04-26 NOTE — ED Provider Notes (Signed)
 "   [facility] Emergency Department Provider Note     Event Date/Time   First MD Initiated Contact with Patient 04/26/24 1618     (approximate)   History   Insect Bite   HPI  Stephanie Bray is a 63 y.o. female PMHx of HTN, IDA and DM presents to the ED with complaint of possible spider bite to right hand x 3 days ago that is now causing swelling to the area.  Increased redness to the area.  Patient denies witnessing a bite.  Denies fever and chills.  Area is nonpruritic.  Patient does endorse intermittent mild pain to the area.  Patient also notes glucose was high during her transport with EMS.    Physical Exam   Triage Vital Signs: ED Triage Vitals  Encounter Vitals Group     BP 04/26/24 1442 (!) 147/65     Girls Systolic BP Percentile --      Girls Diastolic BP Percentile --      Boys Systolic BP Percentile --      Boys Diastolic BP Percentile --      Pulse Rate 04/26/24 1442 76     Resp 04/26/24 1442 16     Temp 04/26/24 1442 97.7 F (36.5 C)     Temp Source 04/26/24 1442 Oral     SpO2 04/26/24 1442 96 %     Weight 04/26/24 1441 125 lb (56.7 kg)     Height 04/26/24 1441 4' 9 (1.448 m)     Head Circumference --      Peak Flow --      Pain Score 04/26/24 1441 8     Pain Loc --      Pain Education --      Exclude from Growth Chart --     Most recent vital signs: Vitals:   04/26/24 1442 04/26/24 1730  BP: (!) 147/65 (!) 146/53  Pulse: 76 (!) 47  Resp: 16 16  Temp: 97.7 F (36.5 C)   SpO2: 96% 95%    General Awake, no distress.  HEENT NCAT.  CV:  Good peripheral perfusion.  RESP:  Normal effort.  ABD:  No distention.  Other:  Right dorsal aspect of hand shows approx 3 cm lesion with central blanching.  Very mild tenderness to palpation. No pus or drainage noted. Neurovascular status intact all throughout. Radial pulse palpated. FROM without difficult to digits. Good and brisk capillary refills.      ED Results / Procedures /  Treatments   Labs (all labs ordered are listed, but only abnormal results are displayed) Labs Reviewed  CBC - Abnormal; Notable for the following components:      Result Value   RBC 5.21 (*)    MCV 74.5 (*)    MCH 23.0 (*)    RDW 17.0 (*)    All other components within normal limits  BASIC METABOLIC PANEL WITH GFR - Abnormal; Notable for the following components:   Glucose, Bld 104 (*)    All other components within normal limits  CBG MONITORING, ED - Abnormal; Notable for the following components:   Glucose-Capillary 100 (*)    All other components within normal limits   No results found.  PROCEDURES:  Critical Care performed: No  Procedures   MEDICATIONS ORDERED IN ED: Medications  bacitracin  ointment ( Topical Given 04/26/24 1739)  acetaminophen  (TYLENOL ) tablet 1,000 mg (1,000 mg Oral Given 04/26/24 1707)  Tdap (ADACEL ) injection 0.5 mL (0.5 mLs Intramuscular  Given 04/26/24 1739)     IMPRESSION / MDM / ASSESSMENT AND PLAN / ED COURSE  I reviewed the triage vital signs and the nursing notes.                              Clinical Course as of 04/26/24 2023  Mon Apr 26, 2024  1723 Tetanus: Completed in 2020 [MH]  1736 CBC(!) No acute findings  [MH]  1736 Basic metabolic panel(!) Glucose 104 [MH]  1737 CBG monitoring, ED(!) 100 [MH]    Clinical Course User Index [MH] Margrette, Melik Blancett A, PA-C    63 y.o. female presents to the emergency department for evaluation and treatment of possible insect bite to right hand. See HPI for further details.   Differential diagnosis includes, but is not limited to insect bite, spider bite, cellulitis, contact dermatitis, hyperglycemia  Patient's presentation is most consistent with acute complicated illness / injury requiring diagnostic workup.  Physical exam findings including media are as stated above.  Patient did not witness bite so unable to confirm recluse spider bite just from characteristics alone.  Her Tdap was  updated during this visit.  Given Tylenol  for pain control.  The area was cleaned thoroughly and irrigated.  We did apply bacitracin  ointment and dressed the area appropriately.  Education on wound care at home was provided.  Basic labs are reassuring and appears to be stable at baseline.  Glucose is not elevated where intervention is indicated.  We briefly discussed of diabetic medication at home in which the patient reports her primary care doctor discontinued her Jardiance  due to increase of weight loss.  She has been using diet as her main treatment.  She has an allergy to metformin .  It was discussed to closely follow-up with her primary care provider to further discuss medication management or lifestyle change maintenance for her diabetes.  She verbalized understanding and agreement with this care plan.  Education was provided for elevation and ice application over the hand to help reduce swelling. She is in stable condition for discharge home.  Short course of Keflex  sent to pharmacy to cover for possible cellulitis.  ED return precautions were discussed. All questions and concerns were addressed during this ED visit.     FINAL CLINICAL IMPRESSION(S) / ED DIAGNOSES   Final diagnoses:  Hand pain, right   Rx / DC Orders   ED Discharge Orders          Ordered    cephALEXin  (KEFLEX ) 500 MG capsule  2 times daily        04/26/24 1731             Note:  This document was prepared using Dragon voice recognition software and may include unintentional dictation errors.    Margrette, Amyrah Pinkhasov A, PA-C 04/26/24 2024    Floy Roberts, MD 04/26/24 2115  "

## 2024-04-26 NOTE — Discharge Instructions (Addendum)
 Your evaluated in the ED for a possible insect bite to your right hand.  Your lab work is reassuring and your glucose (blood sugar) is within normal range.  Your tetanus was updated today.  You will need to keep the area clean with gentle soap and water daily.  Cover with a clean gauze or bandage after your daily shower.  Keep your hand elevated is much as possible.  You can apply ice to the affected area to help with swelling.  You have been prescribed antibiotics.  Take as instructed until dose is complete.  Please follow-up closely with your primary care provider to discuss diabetes medication management.  Call and schedule an appointment tomorrow.  If any new or worsening symptoms occur please return to ED for further patient.

## 2024-04-26 NOTE — ED Triage Notes (Signed)
 Pt via [facility] from home. Pt c/o possible spider bite the R hand, redness and swelling noted to R hand. Unknown what bit her. Pt states that she also been out of her DM medications and was told her glucose was high by EMS. Denies any other pain. Pt is A&Ox4 and NAD

## 2024-05-26 ENCOUNTER — Telehealth: Payer: Self-pay

## 2024-05-26 NOTE — Telephone Encounter (Signed)
 Copied from CRM #[id number]. Topic: Clinical - Medication Refill >> May 26, 2024 10:25 AM Chasity T wrote: Medication: traZODone  (DESYREL ) 100 MG tablet   Has the patient contacted their pharmacy? Yes   This is the patient's preferred pharmacy:  [facility] - [place], [place] - [address] [facility] [address] [place] [address] Phone: [phone] Fax: [phone]   Is this the correct pharmacy for this prescription? Yes If no, delete pharmacy and type the correct one.   Has the prescription been filled recently? No  Is the patient out of the medication? Yes  Has the patient been seen for an appointment in the last year OR does the patient have an upcoming appointment? Yes  Can we respond through MyChart? Yes  Agent: Please be advised that Rx refills may take up to 3 business days. We ask that you follow-up with your pharmacy.

## 2024-05-27 ENCOUNTER — Ambulatory Visit: Admitting: Nurse Practitioner

## 2024-05-27 ENCOUNTER — Ambulatory Visit

## 2024-05-27 VITALS — BP 118/80 | HR 85 | Temp 98.3°F | Ht <= 58 in | Wt 119.4 lb

## 2024-05-27 DIAGNOSIS — E119 Type 2 diabetes mellitus without complications: Secondary | ICD-10-CM | POA: Diagnosis not present

## 2024-05-27 DIAGNOSIS — R251 Tremor, unspecified: Secondary | ICD-10-CM

## 2024-05-27 DIAGNOSIS — Z114 Encounter for screening for human immunodeficiency virus [HIV]: Secondary | ICD-10-CM | POA: Diagnosis not present

## 2024-05-27 DIAGNOSIS — R634 Abnormal weight loss: Secondary | ICD-10-CM | POA: Diagnosis not present

## 2024-05-27 DIAGNOSIS — I428 Other cardiomyopathies: Secondary | ICD-10-CM

## 2024-05-27 MED ORDER — FREESTYLE LIBRE 3 PLUS SENSOR MISC: 2 refills | Status: AC

## 2024-05-27 NOTE — Progress Notes (Signed)
 "  Established Patient Office Visit  Subjective   Patient ID: Stephanie Bray, female    DOB: 02-23-1961  Age: 64 y.o. MRN: [id number]  Chief Complaint  Patient presents with   Weight Check   Insect Bite   Referral    Pt was recently seen in ED and was told she needed to be referred to a endocrinologist for diabetes.     HPI  06/19/2023 patient weighed 144 pounds.  Patient weighs 119 pounds today she is here for follow-up due to unexplained weight loss Of note patient has have a history of Takotsubo cardiomyopathy, HTN, diabetes type 2, seizures.  Patient currently maintained on lifestyle modifications only for diabetes management.  After review of patient's medication list no concern for weight loss due to medication use.  Last time we did discontinue Jardiance  since it diabetes was well-controlled.  She has been having a chronically sore throat will continue omeprazole  but did do an ENT referral. Patient has no history of smoking.  No family history of cancer.  Patient's last colonoscopy was 06/12/2021 that was clear with repeat in 10 years.Was 05/03/2022 that showed negative HPV and negative cells.  Did show some signs of atrophy  Discussed the use of AI scribe software for clinical note transcription with the patient, who gave verbal consent to proceed.  History of Present Illness Stephanie Bray is a 64 year old female with diabetes who presents with unexplained weight loss.  She has experienced ongoing weight loss, losing approximately 26 pounds over the past year, decreasing from 144 pounds to 119 pounds. No abdominal pain or difficulty swallowing. Her throat remains sore despite antibiotic treatment from an ENT specialist. No fever or chills recently, although she was previously coughing and feeling unwell.  She was hospitalized on May 27, 2024, due to a brown recluse spider bite on her hand, which has since improved with antibiotics. During this visit, her blood sugar was recorded at  100 mg/dL, although EMS had previously noted it at 250 mg/dL. Her blood work at the hospital was stable, with normal kidney function and blood sugar levels.  She has stopped taking Jardiance . She does not monitor her blood sugar at home due to difficulty with finger pricking and hand tremors. She consumes two meals a day, primarily cooking fish and unsalted burgers, and drinks sugar-free coffee, diet green tea, and water. She does not engage in significant physical activity due to balance issues, which have been present for some time, although she has not experienced any falls.     Review of Systems  Constitutional:  Positive for weight loss. Negative for chills and fever.  Respiratory:  Negative for shortness of breath.   Cardiovascular:  Negative for chest pain.  Neurological:  Positive for tremors. Negative for headaches.      Objective:     BP 118/80   Pulse 85   Temp 98.3 F (36.8 C) (Oral)   Ht 4' 9 (1.448 m)   Wt 119 lb 6.4 oz (54.2 kg)   SpO2 95%   BMI 25.84 kg/m  BP Readings from Last 3 Encounters:  05/27/24 118/80  04/26/24 (!) 146/53  04/02/24 (!) 124/50   Wt Readings from Last 3 Encounters:  05/27/24 119 lb 6.4 oz (54.2 kg)  04/26/24 125 lb (56.7 kg)  04/02/24 127 lb (57.6 kg)   SpO2 Readings from Last 3 Encounters:  05/27/24 95%  04/26/24 95%  04/02/24 96%      Physical Exam Vitals and nursing  note reviewed.  Constitutional:      Appearance: Normal appearance.  Cardiovascular:     Rate and Rhythm: Normal rate and regular rhythm.     Heart sounds: Normal heart sounds.  Pulmonary:     Effort: Pulmonary effort is normal.     Breath sounds: Normal breath sounds.  Abdominal:     General: Bowel sounds are normal. There is no distension.     Palpations: There is no mass.     Tenderness: There is no abdominal tenderness.     Hernia: No hernia is present.  Neurological:     Mental Status: She is alert.      No results found for any visits on  05/27/24.    The ASCVD Risk score (Arnett DK, et al., 2019) failed to calculate for the following reasons:   Risk score cannot be calculated because patient has a medical history suggesting prior/existing ASCVD   * - Cholesterol units were assumed    Assessment & Plan:   Problem List Items Addressed This Visit       Other   Tremor   Relevant Orders   Ambulatory referral to Endocrinology   Ambulatory referral to Neurology   Other Visit Diagnoses       Unintentional weight loss    -  Primary   Relevant Orders   CT CHEST ABDOMEN PELVIS W CONTRAST   CBC   Basic metabolic panel with GFR   TSH   HIV Antibody (routine testing w rflx)     Well controlled type 2 diabetes mellitus (HCC)       Relevant Medications   Continuous Glucose Sensor (FREESTYLE LIBRE 3 PLUS SENSOR) MISC   Other Relevant Orders   Ambulatory referral to Endocrinology     Other cardiomyopathies [facility])       Relevant Orders   CT CHEST ABDOMEN PELVIS W CONTRAST     Encounter for screening for human immunodeficiency virus (HIV)       Relevant Orders   HIV Antibody (routine testing w rflx)      Assessment and Plan Assessment & Plan Unintentional weight loss Significant weight loss of 26 pounds over the past year. Differential diagnosis includes gastrointestinal pathology or other systemic causes. - Ordered CT scan of chest, abdomen, and pelvis. - Ordered additional labs for thyroid  function and other potential causes. - Refer to gastroenterologist if CT scan is normal.  Type 2 diabetes mellitus Diabetes well-controlled with HbA1c of 6.0. No insulin  or continuous glucose monitoring due to hand tremors. Insurance coverage for continuous glucose monitor uncertain. - Referred to endocrinologist in [place]. - Order offered for test strips and glucometer.  Patient states she cannot do it because she has a tremor.  She does have a son asked if he could do it she states that he is not squeamish and cannot handle  the sight of blood. - Attempted to obtain continuous glucose monitor, pending insurance approval.  Tremor Contributing to difficulty with self-monitoring of blood glucose due to hand tremors.  Return in about 5 weeks (around 07/01/2024) for DM recheck.    Adina Crandall, NP  "

## 2024-05-27 NOTE — Patient Instructions (Signed)
 Nice to see you today  I have referred you to a sugar doctor I will be in touch with the CT scan once I have reviewed it Follow up with me in 5 weeks

## 2024-05-27 NOTE — Telephone Encounter (Signed)
 I haven't filled this medication for the patient can we see who she was getting it from

## 2024-05-28 LAB — CBC
HCT: 38.9 % (ref 36.0–46.0)
Hemoglobin: 12.3 g/dL (ref 12.0–15.0)
MCHC: 31.8 g/dL (ref 30.0–36.0)
MCV: 74 fl — ABNORMAL LOW (ref 78.0–100.0)
Platelets: 259 K/uL (ref 150.0–400.0)
RBC: 5.25 Mil/uL — ABNORMAL HIGH (ref 3.87–5.11)
RDW: 16.8 % — ABNORMAL HIGH (ref 11.5–15.5)
WBC: 9.6 K/uL (ref 4.0–10.5)

## 2024-05-28 LAB — BASIC METABOLIC PANEL WITH GFR
BUN: 24 mg/dL — ABNORMAL HIGH (ref 6–23)
CO2: 29 meq/L (ref 19–32)
Calcium: 10 mg/dL (ref 8.4–10.5)
Chloride: 103 meq/L (ref 96–112)
Creatinine, Ser: 0.49 mg/dL (ref 0.40–1.20)
GFR: 100.35 mL/min
Glucose, Bld: 109 mg/dL — ABNORMAL HIGH (ref 70–99)
Potassium: 4.3 meq/L (ref 3.5–5.1)
Sodium: 138 meq/L (ref 135–145)

## 2024-05-28 LAB — HIV ANTIBODY (ROUTINE TESTING W REFLEX)
HIV 1&2 Ab, 4th Generation: NONREACTIVE
HIV FINAL INTERPRETATION: NEGATIVE

## 2024-05-28 LAB — TSH: TSH: 2.25 u[IU]/mL (ref 0.35–5.50)

## 2024-05-30 ENCOUNTER — Ambulatory Visit: Payer: Self-pay | Admitting: Nurse Practitioner

## 2024-06-01 ENCOUNTER — Ambulatory Visit

## 2024-06-07 ENCOUNTER — Ambulatory Visit

## 2024-06-11 ENCOUNTER — Other Ambulatory Visit: Payer: Self-pay | Admitting: Nurse Practitioner

## 2024-06-15 ENCOUNTER — Ambulatory Visit

## 2024-06-17 ENCOUNTER — Ambulatory Visit

## 2024-07-13 ENCOUNTER — Ambulatory Visit: Admitting: Cardiology

## 2024-07-16 ENCOUNTER — Ambulatory Visit: Admitting: Nurse Practitioner
